# Patient Record
Sex: Female | Born: 1960
Health system: Southern US, Community
[De-identification: ages and names within clinical notes are randomized; demographics above are authoritative.]

## PROBLEM LIST (undated history)

## (undated) DIAGNOSIS — N809 Endometriosis, unspecified: Secondary | ICD-10-CM

## (undated) DIAGNOSIS — E079 Disorder of thyroid, unspecified: Secondary | ICD-10-CM

## (undated) DIAGNOSIS — I10 Essential (primary) hypertension: Secondary | ICD-10-CM

## (undated) DIAGNOSIS — K921 Melena: Secondary | ICD-10-CM

## (undated) DIAGNOSIS — J45909 Unspecified asthma, uncomplicated: Secondary | ICD-10-CM

## (undated) DIAGNOSIS — E119 Type 2 diabetes mellitus without complications: Secondary | ICD-10-CM

## (undated) HISTORY — DX: Unspecified asthma, uncomplicated: J45.909

## (undated) HISTORY — DX: Disorder of thyroid, unspecified: E07.9

## (undated) HISTORY — PX: CORNEAL TRANSPLANT: SHX108

## (undated) HISTORY — DX: Endometriosis, unspecified: N80.9

## (undated) HISTORY — DX: Melena: K92.1

## (undated) HISTORY — DX: Essential (primary) hypertension: I10

## (undated) HISTORY — DX: Type 2 diabetes mellitus without complications: E11.9

---

## 1995-06-13 DIAGNOSIS — N809 Endometriosis, unspecified: Secondary | ICD-10-CM

## 1995-06-13 HISTORY — DX: Endometriosis, unspecified: N80.9

## 1996-06-12 HISTORY — PX: TUBAL LIGATION: SHX77

## 1998-04-01 ENCOUNTER — Encounter: Payer: Self-pay | Admitting: Obstetrics and Gynecology

## 1998-04-01 ENCOUNTER — Ambulatory Visit (HOSPITAL_COMMUNITY): Admission: RE | Admit: 1998-04-01 | Discharge: 1998-04-01 | Payer: Self-pay | Admitting: Obstetrics and Gynecology

## 1999-05-19 ENCOUNTER — Encounter: Payer: Self-pay | Admitting: Obstetrics and Gynecology

## 1999-05-19 ENCOUNTER — Ambulatory Visit (HOSPITAL_COMMUNITY): Admission: RE | Admit: 1999-05-19 | Discharge: 1999-05-19 | Payer: Self-pay | Admitting: Obstetrics and Gynecology

## 2000-07-06 ENCOUNTER — Encounter: Payer: Self-pay | Admitting: Obstetrics and Gynecology

## 2000-07-06 ENCOUNTER — Ambulatory Visit (HOSPITAL_COMMUNITY): Admission: RE | Admit: 2000-07-06 | Discharge: 2000-07-06 | Payer: Self-pay | Admitting: Obstetrics and Gynecology

## 2000-07-10 ENCOUNTER — Emergency Department (HOSPITAL_COMMUNITY): Admission: EM | Admit: 2000-07-10 | Discharge: 2000-07-10 | Payer: Self-pay | Admitting: Emergency Medicine

## 2000-07-18 ENCOUNTER — Other Ambulatory Visit: Admission: RE | Admit: 2000-07-18 | Discharge: 2000-07-18 | Payer: Self-pay | Admitting: Obstetrics and Gynecology

## 2001-08-12 ENCOUNTER — Other Ambulatory Visit: Admission: RE | Admit: 2001-08-12 | Discharge: 2001-08-12 | Payer: Self-pay | Admitting: Obstetrics and Gynecology

## 2001-08-22 ENCOUNTER — Ambulatory Visit (HOSPITAL_COMMUNITY): Admission: RE | Admit: 2001-08-22 | Discharge: 2001-08-22 | Payer: Self-pay | Admitting: Pulmonary Disease

## 2001-08-22 ENCOUNTER — Encounter: Payer: Self-pay | Admitting: Pulmonary Disease

## 2001-09-30 ENCOUNTER — Encounter: Payer: Self-pay | Admitting: Obstetrics and Gynecology

## 2001-09-30 ENCOUNTER — Ambulatory Visit (HOSPITAL_COMMUNITY): Admission: RE | Admit: 2001-09-30 | Discharge: 2001-09-30 | Payer: Self-pay | Admitting: Obstetrics and Gynecology

## 2002-02-18 ENCOUNTER — Encounter: Admission: RE | Admit: 2002-02-18 | Discharge: 2002-02-18 | Payer: Self-pay | Admitting: Psychiatry

## 2002-02-18 ENCOUNTER — Inpatient Hospital Stay (HOSPITAL_COMMUNITY): Admission: EM | Admit: 2002-02-18 | Discharge: 2002-02-20 | Payer: Self-pay | Admitting: Psychiatry

## 2002-10-24 ENCOUNTER — Encounter: Payer: Self-pay | Admitting: Obstetrics and Gynecology

## 2002-10-24 ENCOUNTER — Encounter: Admission: RE | Admit: 2002-10-24 | Discharge: 2002-10-24 | Payer: Self-pay | Admitting: Obstetrics and Gynecology

## 2002-10-29 ENCOUNTER — Encounter: Payer: Self-pay | Admitting: Obstetrics and Gynecology

## 2002-10-29 ENCOUNTER — Encounter: Admission: RE | Admit: 2002-10-29 | Discharge: 2002-10-29 | Payer: Self-pay | Admitting: Obstetrics and Gynecology

## 2002-11-27 ENCOUNTER — Encounter: Payer: Self-pay | Admitting: Obstetrics and Gynecology

## 2002-11-27 ENCOUNTER — Encounter: Admission: RE | Admit: 2002-11-27 | Discharge: 2002-11-27 | Payer: Self-pay | Admitting: Obstetrics and Gynecology

## 2013-03-07 DIAGNOSIS — N924 Excessive bleeding in the premenopausal period: Secondary | ICD-10-CM | POA: Insufficient documentation

## 2014-08-13 DIAGNOSIS — Z8041 Family history of malignant neoplasm of ovary: Secondary | ICD-10-CM | POA: Insufficient documentation

## 2015-09-15 DIAGNOSIS — R7309 Other abnormal glucose: Secondary | ICD-10-CM | POA: Diagnosis not present

## 2015-09-15 DIAGNOSIS — I10 Essential (primary) hypertension: Secondary | ICD-10-CM | POA: Diagnosis not present

## 2015-09-15 DIAGNOSIS — R319 Hematuria, unspecified: Secondary | ICD-10-CM | POA: Diagnosis not present

## 2015-10-04 DIAGNOSIS — Z8041 Family history of malignant neoplasm of ovary: Secondary | ICD-10-CM | POA: Diagnosis not present

## 2015-10-04 DIAGNOSIS — Z1212 Encounter for screening for malignant neoplasm of rectum: Secondary | ICD-10-CM | POA: Diagnosis not present

## 2015-10-04 DIAGNOSIS — Z01419 Encounter for gynecological examination (general) (routine) without abnormal findings: Secondary | ICD-10-CM | POA: Diagnosis not present

## 2015-10-04 DIAGNOSIS — Z1231 Encounter for screening mammogram for malignant neoplasm of breast: Secondary | ICD-10-CM | POA: Diagnosis not present

## 2015-10-05 DIAGNOSIS — Z8041 Family history of malignant neoplasm of ovary: Secondary | ICD-10-CM | POA: Diagnosis not present

## 2015-10-11 DIAGNOSIS — Z79899 Other long term (current) drug therapy: Secondary | ICD-10-CM | POA: Diagnosis not present

## 2015-11-27 DIAGNOSIS — Z1231 Encounter for screening mammogram for malignant neoplasm of breast: Secondary | ICD-10-CM | POA: Diagnosis not present

## 2016-01-21 DIAGNOSIS — L0100 Impetigo, unspecified: Secondary | ICD-10-CM | POA: Diagnosis not present

## 2016-02-21 DIAGNOSIS — K641 Second degree hemorrhoids: Secondary | ICD-10-CM | POA: Diagnosis not present

## 2016-03-13 ENCOUNTER — Ambulatory Visit (INDEPENDENT_AMBULATORY_CARE_PROVIDER_SITE_OTHER): Payer: BLUE CROSS/BLUE SHIELD

## 2016-03-13 DIAGNOSIS — Z23 Encounter for immunization: Secondary | ICD-10-CM | POA: Diagnosis not present

## 2016-03-17 DIAGNOSIS — K602 Anal fissure, unspecified: Secondary | ICD-10-CM | POA: Diagnosis not present

## 2016-03-31 ENCOUNTER — Encounter: Payer: Self-pay | Admitting: Internal Medicine

## 2016-03-31 ENCOUNTER — Other Ambulatory Visit: Payer: Self-pay | Admitting: Internal Medicine

## 2016-03-31 ENCOUNTER — Ambulatory Visit (INDEPENDENT_AMBULATORY_CARE_PROVIDER_SITE_OTHER): Payer: BLUE CROSS/BLUE SHIELD | Admitting: Internal Medicine

## 2016-03-31 VITALS — BP 130/78 | HR 90 | Temp 99.0°F | Ht 66.0 in | Wt 178.5 lb

## 2016-03-31 DIAGNOSIS — I10 Essential (primary) hypertension: Secondary | ICD-10-CM

## 2016-03-31 DIAGNOSIS — J4520 Mild intermittent asthma, uncomplicated: Secondary | ICD-10-CM

## 2016-03-31 DIAGNOSIS — F411 Generalized anxiety disorder: Secondary | ICD-10-CM

## 2016-03-31 DIAGNOSIS — K602 Anal fissure, unspecified: Secondary | ICD-10-CM | POA: Diagnosis not present

## 2016-03-31 DIAGNOSIS — N809 Endometriosis, unspecified: Secondary | ICD-10-CM | POA: Insufficient documentation

## 2016-03-31 DIAGNOSIS — J45909 Unspecified asthma, uncomplicated: Secondary | ICD-10-CM | POA: Insufficient documentation

## 2016-03-31 DIAGNOSIS — R7303 Prediabetes: Secondary | ICD-10-CM | POA: Insufficient documentation

## 2016-03-31 DIAGNOSIS — E039 Hypothyroidism, unspecified: Secondary | ICD-10-CM | POA: Insufficient documentation

## 2016-03-31 LAB — COMPREHENSIVE METABOLIC PANEL
ALT: 23 U/L (ref 6–29)
AST: 17 U/L (ref 10–35)
Albumin: 4.3 g/dL (ref 3.6–5.1)
Alkaline Phosphatase: 66 U/L (ref 33–130)
BUN: 14 mg/dL (ref 7–25)
CO2: 28 mmol/L (ref 20–31)
Calcium: 9.2 mg/dL (ref 8.6–10.4)
Chloride: 102 mmol/L (ref 98–110)
Creat: 0.87 mg/dL (ref 0.50–1.05)
Glucose, Bld: 171 mg/dL — ABNORMAL HIGH (ref 65–99)
Potassium: 4.2 mmol/L (ref 3.5–5.3)
Sodium: 139 mmol/L (ref 135–146)
Total Bilirubin: 0.3 mg/dL (ref 0.2–1.2)
Total Protein: 6.7 g/dL (ref 6.1–8.1)

## 2016-03-31 LAB — LIPID PANEL
Cholesterol: 239 mg/dL — ABNORMAL HIGH (ref 125–200)
HDL: 41 mg/dL — ABNORMAL LOW (ref >=46)
LDL Cholesterol: 159 mg/dL — ABNORMAL HIGH (ref <130)
Total CHOL/HDL Ratio: 5.8 Ratio — ABNORMAL HIGH (ref <=5.0)
Triglycerides: 197 mg/dL — ABNORMAL HIGH (ref <150)
VLDL: 39 mg/dL — ABNORMAL HIGH (ref <30)

## 2016-03-31 LAB — T4, FREE: Free T4: 1.4 ng/dL (ref 0.8–1.8)

## 2016-03-31 LAB — TSH: TSH: 4.7 mIU/L — ABNORMAL HIGH

## 2016-03-31 MED ORDER — LISINOPRIL-HYDROCHLOROTHIAZIDE 10-12.5 MG PO TABS: 1.0000 | ORAL_TABLET | Freq: Every day | ORAL | 3 refills | Status: DC

## 2016-03-31 NOTE — Assessment & Plan Note (Signed)
Continue Hydroxyzine prn Will monitor 

## 2016-03-31 NOTE — Assessment & Plan Note (Signed)
Controlled on Lisinopril HCT CMET today Medication refilled x 1 year

## 2016-03-31 NOTE — Assessment & Plan Note (Signed)
A1C today Encouraged her to consume a low carb diet and exercise to lose weight

## 2016-03-31 NOTE — Patient Instructions (Signed)

## 2016-03-31 NOTE — Progress Notes (Signed)
HPI  Pt presents to the clinic today to establish care and for management of the conditions listed below. She is transferring care from [facility] in [place] Fissures: Using a Diltiazem compound twice daily, using a fiber supplement and stool softeners. She has seen a Radiographer, therapeutic in [place] and Surveyor, quantity in [place].  Childhood Asthma: Has not affected her an adult. She does not use any inhalers.  Endometriosis: s/p laparotomy for removal of part of the ovaries. No residual effects.  HTN: She is taking Lisinopril-HCTZ as prescribed. Her BP today is 130/78.  Hypothyroidism: She last had her levels drawn 08/2015. She is taking Synthroid as prescribed.  Anxiety: Due to work related stress. She has Hydroxyzine and reports she rarely takes this now.  Prediabetes: She reports her last A1C was 5.7%. She has gained some weight and has not been as strict with her diet and exercise lately.  Flu: 03/2016 Tetanus: 2013 Pap Smear: 08/2015 at [place]: 10/2015 Colon Screening: 04/2013, 10 years Vision Screening: annually Dentist: annually  Past Medical History:  Diagnosis Date  . Blood in stool   . Childhood asthma   . Endometriosis 1997  . Hypertension   . Thyroid disease     Current Outpatient Prescriptions  Medication Sig Dispense Refill  . hydrOXYzine (ATARAX/VISTARIL) 25 MG tablet TAKE 1 TABLET BY MOUTH THREE TIMES A DAY AS NEEDED    . levothyroxine (SYNTHROID, LEVOTHROID) 150 MCG tablet Take by mouth.    [name] lisinopril-hydrochlorothiazide (PRINZIDE,ZESTORETIC) 10-12.5 MG tablet Take by mouth.    . NONFORMULARY OR COMPOUNDED ITEM Apply 1 application topically 3 (three) times daily. Diltiazem 2% ointment    . Sennosides-Docusate Sodium (STOOL SOFTENER LAXATIVE PO) Take 1 tablet by mouth daily as needed.     No current facility-administered medications for this visit.     Allergies  Allergen Reactions  . Aspirin Shortness Of Breath and  Hives  . Penicillins Hives    Family History  Problem Relation Age of Onset  . Ovarian cancer Mother   . Hyperlipidemia Mother   . Alcohol abuse Father   . Hyperlipidemia Father   . Heart disease Father   . Stroke Father   . Hypertension Father     Social History   Social History  . Marital status: Unknown    Spouse name: N/A  . Number of children: N/A  . Years of education: N/A   Occupational History  . Not on file.   Social History Main Topics  . Smoking status: Never Smoker  . Smokeless tobacco: Never Used  . Alcohol use Yes     Comment: occasional  . Drug use: Unknown  . Sexual activity: Not on file   Other Topics Concern  . Not on file   Social History Narrative  . No narrative on file    ROS:  Constitutional: Denies fever, malaise, fatigue, headache or abrupt weight changes.  HEENT: Denies eye pain, eye redness, ear pain, ringing in the ears, wax buildup, runny nose, nasal congestion, bloody nose, or sore throat. Respiratory: Denies difficulty breathing, shortness of breath, cough or sputum production.   Cardiovascular: Denies chest pain, chest tightness, palpitations or swelling in the hands or feet.  Gastrointestinal: Pt reports intermittent blood in stool. Denies abdominal pain, bloating, constipation, diarrhea.  GU: Denies frequency, urgency, pain with urination, blood in urine, odor or discharge. Musculoskeletal: Denies decrease in range of motion, difficulty with gait, muscle pain or joint pain and swelling.  Skin: Denies redness, rashes, lesions or ulcercations.  Neurological: Denies dizziness, difficulty with memory, difficulty with speech or problems with balance and coordination.  Psych: Denies anxiety, depression, SI/HI.  No other specific complaints in a complete review of systems (except as listed in HPI above).  PE:  BP 130/78   Pulse 90   Temp 99 F (37.2 C) (Oral)   Ht 5\' 6"  (1.676 m)   Wt 178 lb 8 oz (81 kg)   LMP 09/11/2015  (Approximate)   SpO2 98%   BMI 28.81 kg/m  Wt Readings from Last 3 Encounters:  03/31/16 178 lb 8 oz (81 kg)    General: Appears her stated age, well developed, well nourished in NAD. Skin: Dry and intact. Neck: Neck supple, trachea midline. No masses, lumps or thyromegaly present.  Cardiovascular: Normal rate and rhythm. S1,S2 noted.  No murmur, rubs or gallops noted. No JVD or BLE edema.  Pulmonary/Chest: Normal effort and positive vesicular breath sounds. No respiratory distress. No wheezes, rales or ronchi noted.  Abdomen: Soft and nontender. Active bowel sounds. Neurological: Alert and oriented.  Psychiatric: Mood and affect normal. Behavior is normal. Judgment and thought content normal.    Assessment and Plan:  RTC in 1 year, for your annual exam Webb Silversmith, NP

## 2016-03-31 NOTE — Assessment & Plan Note (Signed)
Currently not an issue Will monitor 

## 2016-03-31 NOTE — Assessment & Plan Note (Signed)
Resolved s/p lapartomy

## 2016-03-31 NOTE — Addendum Note (Signed)
Addended by: Ellamae Sia on: 03/31/2016 04:05 PM   Modules accepted: Orders

## 2016-03-31 NOTE — Assessment & Plan Note (Signed)
Continue Diltiazem compound as prescribed She will continue to follow with colorectal specialist

## 2016-03-31 NOTE — Assessment & Plan Note (Signed)
TSH and T4 today Will refill/adjust Synthroid based on labs

## 2016-04-06 ENCOUNTER — Other Ambulatory Visit: Payer: Self-pay

## 2016-04-06 LAB — HEMOGLOBIN A1C
Hgb A1c MFr Bld: 5.8 % — ABNORMAL HIGH (ref <5.7)
Mean Plasma Glucose: 120 mg/dL

## 2016-04-06 MED ORDER — LEVOTHYROXINE SODIUM 150 MCG PO TABS: 150.0000 ug | ORAL_TABLET | Freq: Every day | ORAL | 3 refills | Status: DC

## 2016-04-06 MED ORDER — SIMVASTATIN 10 MG PO TABS: 10.0000 mg | ORAL_TABLET | Freq: Every day | ORAL | 3 refills | Status: DC

## 2016-04-06 MED ORDER — SIMVASTATIN 10 MG PO TABS: 10.0000 mg | ORAL_TABLET | Freq: Every day | ORAL | 0 refills | Status: DC

## 2016-04-06 NOTE — Addendum Note (Signed)
Addended by: Lurlean Nanny on: 04/06/2016 12:01 PM   Modules accepted: Orders

## 2016-04-06 NOTE — Telephone Encounter (Signed)
While I was on phone with pt Mary Baity NP sent in refills for levothyroxine and simvastatin.nothing further needed.

## 2016-04-14 ENCOUNTER — Encounter: Payer: Self-pay | Admitting: Family Medicine

## 2016-04-14 ENCOUNTER — Ambulatory Visit (INDEPENDENT_AMBULATORY_CARE_PROVIDER_SITE_OTHER): Payer: BLUE CROSS/BLUE SHIELD | Admitting: Family Medicine

## 2016-04-14 VITALS — BP 124/88 | HR 75 | Temp 98.2°F | Ht 66.0 in | Wt 181.0 lb

## 2016-04-14 DIAGNOSIS — R350 Frequency of micturition: Secondary | ICD-10-CM

## 2016-04-14 DIAGNOSIS — N390 Urinary tract infection, site not specified: Secondary | ICD-10-CM | POA: Insufficient documentation

## 2016-04-14 DIAGNOSIS — N3000 Acute cystitis without hematuria: Secondary | ICD-10-CM | POA: Diagnosis not present

## 2016-04-14 LAB — POC URINALSYSI DIPSTICK (AUTOMATED)
Bilirubin, UA: NEGATIVE
Blood, UA: NEGATIVE
Glucose, UA: NEGATIVE
Ketones, UA: NEGATIVE
Leukocytes, UA: NEGATIVE
Nitrite, UA: NEGATIVE
Protein, UA: NEGATIVE
Spec Grav, UA: 1.025
Urobilinogen, UA: 0.2
pH, UA: 6.5

## 2016-04-14 MED ORDER — SULFAMETHOXAZOLE-TRIMETHOPRIM 800-160 MG PO TABS: 1.0000 | ORAL_TABLET | Freq: Two times a day (BID) | ORAL | 0 refills | Status: DC

## 2016-04-14 NOTE — Progress Notes (Signed)
Pre visit review using our clinic review tool, if applicable. No additional management support is needed unless otherwise documented below in the visit note. 

## 2016-04-14 NOTE — Assessment & Plan Note (Signed)
Likely partially treated UTI. Recommend completion of antibiotics x 3 days course ( sulfa) given still with symptoms despite clear UA.  Push fluids.

## 2016-04-14 NOTE — Progress Notes (Signed)
   Subjective:    Patient ID: Mary Schaefer, female    DOB: [birth year], 55 y.o.   MRN: [id number]  Urinary Frequency   This is a new problem. The current episode started in the past 7 days (3 days). The problem has been gradually improving. The quality of the pain is described as burning. The pain is moderate. There has been no fever. She is sexually active. There is no history of pyelonephritis. Associated symptoms include flank pain, frequency and urgency. Pertinent negatives include no chills, hematuria, hesitancy, nausea, sweats or vomiting. Associated symptoms comments: Left mid back pain. She has tried antibiotics and NSAIDs for the symptoms. The treatment provided mild relief. There is no history of catheterization, kidney stones, recurrent UTIs, a single kidney or a urological procedure. frequent UTI, last in 11/2015  Dysuria   Associated symptoms include flank pain, frequency and urgency. Pertinent negatives include no chills, hematuria, hesitancy, nausea, sweats or vomiting. There is no history of catheterization, kidney stones, recurrent UTIs, a single kidney or a urological procedure. frequent UTI, last in 11/2015  Back Pain  Associated symptoms include dysuria.    She has also noted a mole on  Right mid back. Husband noted it during [place]. Slightly itching, no drainage.  Unsure if changing. No history of skin issues, no family history of melanoma.  Review of Systems  Constitutional: Negative for chills.  Gastrointestinal: Negative for nausea and vomiting.  Genitourinary: Positive for dysuria, flank pain, frequency and urgency. Negative for hematuria and hesitancy.  Musculoskeletal: Positive for back pain.       Objective:   Physical Exam  Constitutional: Vital signs are normal. She appears well-developed and well-nourished. She is cooperative.  Non-toxic appearance. She does not appear ill. No distress.  HENT:  Head: Normocephalic.  Right Ear: Hearing, tympanic membrane,  external ear and ear canal normal. Tympanic membrane is not erythematous, not retracted and not bulging.  Left Ear: Hearing, tympanic membrane, external ear and ear canal normal. Tympanic membrane is not erythematous, not retracted and not bulging.  Nose: No mucosal edema or rhinorrhea. Right sinus exhibits no maxillary sinus tenderness and no frontal sinus tenderness. Left sinus exhibits no maxillary sinus tenderness and no frontal sinus tenderness.  Mouth/Throat: Uvula is midline, oropharynx is clear and moist and mucous membranes are normal.  Eyes: Conjunctivae, EOM and lids are normal. Pupils are equal, round, and reactive to light. Lids are everted and swept, no foreign bodies found.  Neck: Trachea normal and normal range of motion. Neck supple. Carotid bruit is not present. No thyroid mass and no thyromegaly present.  Cardiovascular: Normal rate, regular rhythm, S1 normal, S2 normal, normal heart sounds, intact distal pulses and normal pulses.  Exam reveals no gallop and no friction rub.   No murmur heard. Pulmonary/Chest: Effort normal and breath sounds normal. No tachypnea. No respiratory distress. She has no decreased breath sounds. She has no wheezes. She has no rhonchi. She has no rales.  Abdominal: Soft. Normal appearance and bowel sounds are normal. There is no tenderness. There is CVA tenderness.  Neurological: She is alert.  Skin: Skin is warm, dry and intact. No rash noted.  Psychiatric: Her speech is normal and behavior is normal. Judgment and thought content normal. Her mood appears not anxious. Cognition and memory are normal. She does not exhibit a depressed mood.          Assessment & Plan:

## 2016-04-14 NOTE — Patient Instructions (Addendum)
Push fluids. Complete antibiotics x 3 days. Call if fever on antibiotics or not improving as expected.   Urinary Tract Infection Urinary tract infections (UTIs) can develop anywhere along your urinary tract. Your urinary tract is your body's drainage system for removing wastes and extra water. Your urinary tract includes two kidneys, two ureters, a bladder, and a urethra. Your kidneys are a pair of bean-shaped organs. Each kidney is about the size of your fist. They are located below your ribs, one on each side of your spine. CAUSES Infections are caused by microbes, which are microscopic organisms, including fungi, viruses, and bacteria. These organisms are so small that they can only be seen through a microscope. Bacteria are the microbes that most commonly cause UTIs. SYMPTOMS  Symptoms of UTIs may vary by age and gender of the patient and by the location of the infection. Symptoms in young women typically include a frequent and intense urge to urinate and a painful, burning feeling in the bladder or urethra during urination. Older women and men are more likely to be tired, shaky, and weak and have muscle aches and abdominal pain. A fever may mean the infection is in your kidneys. Other symptoms of a kidney infection include pain in your back or sides below the ribs, nausea, and vomiting. DIAGNOSIS To diagnose a UTI, your caregiver will ask you about your symptoms. Your caregiver will also ask you to provide a urine sample. The urine sample will be tested for bacteria and white blood cells. White blood cells are made by your body to help fight infection. TREATMENT  Typically, UTIs can be treated with medication. Because most UTIs are caused by a bacterial infection, they usually can be treated with the use of antibiotics. The choice of antibiotic and length of treatment depend on your symptoms and the type of bacteria causing your infection. HOME CARE INSTRUCTIONS  If you were prescribed  antibiotics, take them exactly as your caregiver instructs you. Finish the medication even if you feel better after you have only taken some of the medication.  Drink enough water and fluids to keep your urine clear or pale yellow.  Avoid caffeine, tea, and carbonated beverages. They tend to irritate your bladder.  Empty your bladder often. Avoid holding urine for long periods of time.  Empty your bladder before and after sexual intercourse.  After a bowel movement, women should cleanse from front to back. Use each tissue only once. SEEK MEDICAL CARE IF:   You have back pain.  You develop a fever.  Your symptoms do not begin to resolve within 3 days. SEEK IMMEDIATE MEDICAL CARE IF:   You have severe back pain or lower abdominal pain.  You develop chills.  You have nausea or vomiting.  You have continued burning or discomfort with urination. MAKE SURE YOU:   Understand these instructions.  Will watch your condition.  Will get help right away if you are not doing well or get worse.   This information is not intended to replace advice given to you by your health care provider. Make sure you discuss any questions you have with your health care provider.   Document Released: 03/08/2005 Document Revised: 02/17/2015 Document Reviewed: 07/07/2011 Elsevier Interactive Patient Education [employer].

## 2016-05-10 DIAGNOSIS — K602 Anal fissure, unspecified: Secondary | ICD-10-CM | POA: Diagnosis not present

## 2016-05-17 ENCOUNTER — Ambulatory Visit (INDEPENDENT_AMBULATORY_CARE_PROVIDER_SITE_OTHER): Payer: BLUE CROSS/BLUE SHIELD | Admitting: Family Medicine

## 2016-05-17 ENCOUNTER — Encounter: Payer: Self-pay | Admitting: *Deleted

## 2016-05-17 ENCOUNTER — Encounter: Payer: Self-pay | Admitting: Family Medicine

## 2016-05-17 VITALS — BP 116/82 | HR 95 | Temp 98.8°F | Wt 182.2 lb

## 2016-05-17 DIAGNOSIS — J00 Acute nasopharyngitis [common cold]: Secondary | ICD-10-CM

## 2016-05-17 MED ORDER — HYDROCODONE-HOMATROPINE 5-1.5 MG/5ML PO SYRP: ORAL_SOLUTION | ORAL | 0 refills | Status: DC

## 2016-05-17 NOTE — Progress Notes (Signed)
Pre visit review using our clinic review tool, if applicable. No additional management support is needed unless otherwise documented below in the visit note. 

## 2016-05-17 NOTE — Progress Notes (Signed)
Dr. Frederico Hamman T. Kateri Balch, MD, [facility] [place], [address] Phone: [phone] Fax: [phone]  05/17/2016  Patient: Mary Schaefer, MRN: [id number], DOB: 03/24/1961, 55 y.o.  Primary Physician:  Webb Silversmith, NP   Chief Complaint  Patient presents with  . URI    ST,ear,congestion,cough x 3-4 days   Subjective:   This 55 y.o. female patient presents with runny nose, sneezing, cough, sore throat, malaise and minimal / low-grade fever .   Ear, throat, and chest hurts  - 4 days, not getting any lung probs.  Nonsmoker.   ? Fever last night.  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   + recent exposure to others with similar symptoms.   The patent denies sore throat as the primary complaint. Denies sthortness of breath/wheezing, high fever, chest pain, rhinits for more than 14 days, significant myalgia, otalgia, facial pain, abdominal pain, changes in bowel or bladder.  PMH, PHS, Allergies, Problem List, Medications, Family History, and Social History have all been reviewed.  Patient Active Problem List   Diagnosis Date Noted  . UTI (urinary tract infection) 04/14/2016  . Prediabetes 03/31/2016  . Essential hypertension 03/31/2016  . Acquired hypothyroidism 03/31/2016  . Anal fissure 03/31/2016  . Childhood asthma 03/31/2016  . Endometriosis 03/31/2016  . Generalized anxiety disorder 03/31/2016    Past Medical History:  Diagnosis Date  . Blood in stool   . Childhood asthma   . Endometriosis 1997  . Hypertension   . Thyroid disease     Past Surgical History:  Procedure Laterality Date  . TUBAL LIGATION  1998    Social History   Social History  . Marital status: Unknown    Spouse name: N/A  . Number of children: N/A  . Years of education: N/A   Occupational History  . Not on file.   Social History Main Topics  . Smoking status: Never Smoker  . Smokeless tobacco: Never Used  . Alcohol use Yes   Comment: occasional  . Drug use:   . Sexual activity: Yes   Other Topics Concern  . Not on file   Social History Narrative  . No narrative on file    Family History  Problem Relation Age of Onset  . Ovarian cancer Mother   . Hyperlipidemia Mother   . Alcohol abuse Father   . Hyperlipidemia Father   . Heart disease Father   . Stroke Father   . Hypertension Father     Allergies  Allergen Reactions  . Aspirin Shortness Of Breath and Hives  . Penicillins Hives    Medication list reviewed and updated in full in [place].  ROS as above, eating and drinking - tolerating PO. Urinating normally. No excessive vomitting or diarrhea. O/w as above.  Objective:   Blood pressure 116/82, pulse 95, temperature 98.8 F (37.1 C), temperature source Oral, weight 182 lb 4 oz (82.7 kg), last menstrual period 07/19/2015, SpO2 98 %.  GEN: WDWN, Non-toxic, Atraumatic, normocephalic. A and O x 3. HEENT: Oropharynx clear without exudate, MMM, no significant LAD, mild rhinnorhea Ears: TM clear, COL visualized with good landmarks CV: RRR, no m/g/r. Pulm: CTA B, no wheezes, rhonchi, or crackles, normal respiratory effort. EXT: no c/c/e Psych: well oriented, neither depressed nor anxious in appearance  Objective Data:  Assessment and Plan:   Acute nasopharyngitis  Supportive care reviewed with patient. See patient instruction section.  Follow-up: No Follow-up on file.  New  Prescriptions   HYDROCODONE-HOMATROPINE (HYCODAN) 5-1.5 MG/5ML SYRUP    1 tsp po at night before bed prn cough   Signed,  Jaizon Deroos T. Jerita Wimbush, MD   Patient's Medications  New Prescriptions   HYDROCODONE-HOMATROPINE (HYCODAN) 5-1.5 MG/5ML SYRUP    1 tsp po at night before bed prn cough  Previous Medications   HYDROXYZINE (ATARAX/VISTARIL) 25 MG TABLET    TAKE 1 TABLET BY MOUTH THREE TIMES A DAY AS NEEDED   LEVOTHYROXINE (SYNTHROID, LEVOTHROID) 150 MCG TABLET    Take 1 tablet (150 mcg total) by mouth daily  before breakfast.   LISINOPRIL-HYDROCHLOROTHIAZIDE (PRINZIDE,ZESTORETIC) 10-12.5 MG TABLET    Take 1 tablet by mouth daily.   NONFORMULARY OR COMPOUNDED ITEM    Apply 1 application topically 3 (three) times daily. Diltiazem 2% ointment   SENNOSIDES-DOCUSATE SODIUM (STOOL SOFTENER LAXATIVE PO)    Take 1 tablet by mouth daily as needed.   SIMVASTATIN (ZOCOR) 10 MG TABLET    Take 1 tablet (10 mg total) by mouth at bedtime.  Modified Medications   No medications on file  Discontinued Medications   SULFAMETHOXAZOLE-TRIMETHOPRIM (BACTRIM DS,SEPTRA DS) 800-160 MG TABLET    Take 1 tablet by mouth 2 (two) times daily.

## 2016-05-19 ENCOUNTER — Encounter: Payer: Self-pay | Admitting: Family Medicine

## 2016-05-19 ENCOUNTER — Ambulatory Visit (INDEPENDENT_AMBULATORY_CARE_PROVIDER_SITE_OTHER): Payer: BLUE CROSS/BLUE SHIELD | Admitting: Family Medicine

## 2016-05-19 VITALS — BP 130/60 | HR 104 | Temp 98.4°F | Wt 182.0 lb

## 2016-05-19 DIAGNOSIS — J069 Acute upper respiratory infection, unspecified: Secondary | ICD-10-CM

## 2016-05-19 DIAGNOSIS — B9789 Other viral agents as the cause of diseases classified elsewhere: Secondary | ICD-10-CM | POA: Diagnosis not present

## 2016-05-19 DIAGNOSIS — R112 Nausea with vomiting, unspecified: Secondary | ICD-10-CM | POA: Diagnosis not present

## 2016-05-19 MED ORDER — ONDANSETRON 8 MG PO TBDP
8.0000 mg | ORAL_TABLET | Freq: Once | ORAL | Status: AC
  Administered 2016-05-19: 8 mg via ORAL

## 2016-05-19 MED ORDER — ONDANSETRON 4 MG PO TBDP: 4.0000 mg | ORAL_TABLET | Freq: Once | ORAL | Status: DC

## 2016-05-19 MED ORDER — ONDANSETRON 8 MG PO TBDP: 8.0000 mg | ORAL_TABLET | Freq: Three times a day (TID) | ORAL | 0 refills | Status: DC | PRN

## 2016-05-19 NOTE — Patient Instructions (Signed)
Bland Diet Introduction A bland diet consists of foods that do not have a lot of fat or fiber. Foods without fat or fiber are easier for the body to digest. They are also less likely to irritate your mouth, throat, stomach, and other parts of your gastrointestinal tract. A bland diet is sometimes called a BRAT diet. What is my plan? Your health care provider or dietitian may recommend specific changes to your diet to prevent and treat your symptoms, such as:  Eating small meals often.  Cooking food until it is soft enough to chew easily.  Chewing your food well.  Drinking fluids slowly.  Not eating foods that are very spicy, sour, or fatty.  Not eating citrus fruits, such as oranges and grapefruit. What do I need to know about this diet?  Eat a variety of foods from the bland diet food list.  Do not follow a bland diet longer than you have to.  Ask your health care provider whether you should take vitamins. What foods can I eat? Grains  Hot cereals, such as cream of wheat. Bread, crackers, or tortillas made from refined white flour. Rice. Vegetables  Canned or cooked vegetables. Mashed or boiled potatoes. Fruits  Bananas. Applesauce. Other types of cooked or canned fruit with the skin and seeds removed, such as canned peaches or pears. Meats and Other Protein Sources  Scrambled eggs. Creamy peanut butter or other nut butters. Lean, well-cooked meats, such as chicken or fish. Tofu. Soups or broths. Dairy  Low-fat dairy products, such as milk, cottage cheese, or yogurt. Beverages  Water. Herbal tea. Apple juice. Sweets and Desserts  Pudding. Custard. Fruit gelatin. Ice cream. Fats and Oils  Mild salad dressings. Canola or olive oil. The items listed above may not be a complete list of allowed foods or beverages. Contact your dietitian for more options.  What foods are not recommended? Foods and ingredients that are often not recommended include:  Spicy foods, such as hot  sauce or salsa.  Fried foods.  Sour foods, such as pickled or fermented foods.  Raw vegetables or fruits, especially citrus or berries.  Caffeinated drinks.  Alcohol.  Strongly flavored seasonings or condiments. The items listed above may not be a complete list of foods and beverages that are not allowed. Contact your dietitian for more information.  This information is not intended to replace advice given to you by your health care provider. Make sure you discuss any questions you have with your health care provider. Document Released: 09/20/2015 Document Revised: 11/04/2015 Document Reviewed: 06/10/2014  2017 Elsevier  

## 2016-05-19 NOTE — Progress Notes (Signed)
   Subjective:    Patient ID: Mary Schaefer, female    DOB: [birth year], 55 y.o.   MRN: [id number]  HPI This is a 55 yo female who presents today with emesis x 1. Nauseated without cough. No food today, only water, no abdominal pain, nausea only. Has been taking Dayquil, Mucinex DM and Hycodan with some relief. Yellow nasal drainage and ear pain. Afraid to take ibuprofen- worried about stomach upset. Fever to 100. Nothing for fever today. She was seen 2 days ago with URI symptoms (started 5 days ago). Cough productive of yellow- green sputum, gags her. No wheeze or SOB. Feels achy.   Past Medical History:  Diagnosis Date  . Blood in stool   . Childhood asthma   . Endometriosis 1997  . Hypertension   . Thyroid disease    Past Surgical History:  Procedure Laterality Date  . TUBAL LIGATION  1998   Family History  Problem Relation Age of Onset  . Ovarian cancer Mother   . Hyperlipidemia Mother   . Alcohol abuse Father   . Hyperlipidemia Father   . Heart disease Father   . Stroke Father   . Hypertension Father    Social History  Substance Use Topics  . Smoking status: Never Smoker  . Smokeless tobacco: Never Used  . Alcohol use Yes     Comment: occasional      Review of Systems Per HPI     Objective:   Physical Exam  Constitutional: She is oriented to person, place, and time. She appears well-developed and well-nourished. She appears ill. No distress.  HENT:  Head: Normocephalic and atraumatic.  Right Ear: Tympanic membrane, external ear and ear canal normal.  Left Ear: Tympanic membrane, external ear and ear canal normal.  Nose: Mucosal edema and rhinorrhea present.  Mouth/Throat: Uvula is midline and mucous membranes are normal. Posterior oropharyngeal erythema present. No oropharyngeal exudate or posterior oropharyngeal edema.  Cardiovascular: Normal rate, regular rhythm and normal heart sounds.   Pulmonary/Chest: Effort normal and breath sounds normal.  Abdominal:  Soft. Bowel sounds are normal. She exhibits no distension. There is no tenderness. There is no rebound and no guarding.  Neurological: She is alert and oriented to person, place, and time.  Skin: Skin is warm and dry. She is not diaphoretic.  Psychiatric: She has a normal mood and affect. Her behavior is normal. Thought content normal.  Vitals reviewed.     BP 130/60   Pulse (!) 104   Temp 98.4 F (36.9 C) (Oral)   Wt 182 lb (82.6 kg)   LMP 07/19/2015   BMI 29.38 kg/m  Wt Readings from Last 3 Encounters:  05/19/16 182 lb (82.6 kg)  05/17/16 182 lb 4 oz (82.7 kg)  04/14/16 181 lb (82.1 kg)       Assessment & Plan:  1. Non-intractable vomiting with nausea, unspecified vomiting type - suspect viral etiology - BRAT diet, increase fluids until urine light yellow - ondansetron (ZOFRAN-ODT) disintegrating tablet 8 mg; Take 1 tablet (8 mg total) by mouth once. - ondansetron (ZOFRAN-ODT) 8 MG disintegrating tablet; Take 1 tablet (8 mg total) by mouth every 8 (eight) hours as needed for nausea.  Dispense: 15 tablet; Refill: 0  2. Viral URI with cough - continue Hycodan, Mucinex DM, Dayquil PRN  - RTC precautions reviewed  Clarene Reamer, FNP-BC  [facility]  05/19/2016 2:29 PM

## 2016-05-19 NOTE — Progress Notes (Signed)
Pre visit review using our clinic review tool, if applicable. No additional management support is needed unless otherwise documented below in the visit note. 

## 2016-07-07 ENCOUNTER — Other Ambulatory Visit: Payer: BLUE CROSS/BLUE SHIELD

## 2016-08-03 ENCOUNTER — Ambulatory Visit (INDEPENDENT_AMBULATORY_CARE_PROVIDER_SITE_OTHER): Payer: BLUE CROSS/BLUE SHIELD | Admitting: Internal Medicine

## 2016-08-03 ENCOUNTER — Encounter: Payer: Self-pay | Admitting: Internal Medicine

## 2016-08-03 VITALS — BP 126/84 | HR 94 | Temp 98.4°F | Wt 190.8 lb

## 2016-08-03 DIAGNOSIS — L509 Urticaria, unspecified: Secondary | ICD-10-CM

## 2016-08-03 DIAGNOSIS — H10413 Chronic giant papillary conjunctivitis, bilateral: Secondary | ICD-10-CM | POA: Diagnosis not present

## 2016-08-03 NOTE — Progress Notes (Signed)
Subjective:    Patient ID: Mary Schaefer, female    DOB: 05-26-1961, 56 y.o.   MRN: [id number]  HPI  Pt presents to the clinic today with c/o hives. She reports this started 6 weeks ago. It is intermittent. It breaks out on her chest and abdomen. It is very itchy. For some reason, it only occurs at night. Other than that she can not find a pattern. She has not washed her sheets in any new detergent. She does not have issues with hives during the day. She does not feel stressed out. She has not eaten anything new. She has taken Hydroxyzine with good relief.   Review of Systems      Past Medical History:  Diagnosis Date  . Blood in stool   . Childhood asthma   . Endometriosis 1997  . Hypertension   . Thyroid disease     Current Outpatient Prescriptions  Medication Sig Dispense Refill  . HYDROcodone-homatropine (HYCODAN) 5-1.5 MG/5ML syrup 1 tsp po at night before bed prn cough 120 mL 0  . hydrOXYzine (ATARAX/VISTARIL) 25 MG tablet TAKE 1 TABLET BY MOUTH THREE TIMES A DAY AS NEEDED    . levothyroxine (SYNTHROID, LEVOTHROID) 150 MCG tablet Take 1 tablet (150 mcg total) by mouth daily before breakfast. 90 tablet 3  . lisinopril-hydrochlorothiazide (PRINZIDE,ZESTORETIC) 10-12.5 MG tablet Take 1 tablet by mouth daily. 90 tablet 3  . NONFORMULARY OR COMPOUNDED ITEM Apply 1 application topically 3 (three) times daily. Diltiazem 2% ointment    . ondansetron (ZOFRAN-ODT) 8 MG disintegrating tablet Take 1 tablet (8 mg total) by mouth every 8 (eight) hours as needed for nausea. 15 tablet 0  . Sennosides-Docusate Sodium (STOOL SOFTENER LAXATIVE PO) Take 1 tablet by mouth daily as needed.    . simvastatin (ZOCOR) 10 MG tablet Take 1 tablet (10 mg total) by mouth at bedtime. 90 tablet 0   No current facility-administered medications for this visit.     Allergies  Allergen Reactions  . Aspirin Shortness Of Breath and Hives  . Penicillins Hives    Family History  Problem Relation Age of  Onset  . Ovarian cancer Mother   . Hyperlipidemia Mother   . Alcohol abuse Father   . Hyperlipidemia Father   . Heart disease Father   . Stroke Father   . Hypertension Father     Social History   Social History  . Marital status: Unknown    Spouse name: N/A  . Number of children: N/A  . Years of education: N/A   Occupational History  . Not on file.   Social History Main Topics  . Smoking status: Never Smoker  . Smokeless tobacco: Never Used  . Alcohol use Yes     Comment: occasional  . Drug use: Yes  . Sexual activity: Yes   Other Topics Concern  . Not on file   Social History Narrative  . No narrative on file     Constitutional: Denies fever, malaise, fatigue, headache or abrupt weight changes.  Skin: Pt reports hives. Denies ulcercations.    No other specific complaints in a complete review of systems (except as listed in HPI above).  Objective:   Physical Exam   BP 126/84   Pulse 94   Temp 98.4 F (36.9 C) (Oral)   Wt 190 lb 12 oz (86.5 kg)   SpO2 98%   BMI 30.79 kg/m  Wt Readings from Last 3 Encounters:  08/03/16 190 lb 12 oz (86.5 kg)  05/19/16 182 lb (82.6 kg)  05/17/16 182 lb 4 oz (82.7 kg)    General: Appears her stated age, well developed, well nourished in NAD. Skin: Warm, dry and intact. No hives today, but she brought pictures.   BMET    Component Value Date/Time   NA 139 03/31/2016 1606   K 4.2 03/31/2016 1606   CL 102 03/31/2016 1606   CO2 28 03/31/2016 1606   GLUCOSE 171 (H) 03/31/2016 1606   BUN 14 03/31/2016 1606   CREATININE 0.87 03/31/2016 1606   CALCIUM 9.2 03/31/2016 1606    Lipid Panel     Component Value Date/Time   CHOL 239 (H) 03/31/2016 1606   TRIG 197 (H) 03/31/2016 1606   HDL 41 (L) 03/31/2016 1606   CHOLHDL 5.8 (H) 03/31/2016 1606   VLDL 39 (H) 03/31/2016 1606   LDLCALC 159 (H) 03/31/2016 1606    CBC No results found for: WBC, RBC, HGB, HCT, PLT, MCV, MCH, MCHC, RDW, LYMPHSABS, MONOABS, EOSABS,  BASOSABS  Hgb A1C Lab Results  Component Value Date   HGBA1C 5.8 (H) 03/31/2016           Assessment & Plan:   Hives, idiopathic at this point:  Start taking a Claritin or Allegra once daily Can use the Hydroxyzine for breakthrough If symptoms persist, we can refer you to an allergist for further testing  RTC in 1 month for your annual exam Webb Silversmith, NP

## 2016-08-03 NOTE — Patient Instructions (Signed)
Hives Introduction Hives (urticaria) are itchy, red, swollen areas on your skin. Hives can show up on any part of your body, and they can vary in size. They can be as small as the tip of a pen or much larger. Hives often fade within 24 hours (acute hives). In other cases, new hives show up after old ones fade. This can continue for many days or weeks (chronic hives). Hives are caused by your body's reaction to an irritant or to something that you are allergic to (trigger). You can get hives right after being around a trigger or hours later. Hives do not spread from person to person (are not contagious). Hives may get worse if you scratch them, if you exercise, or if you have worries (emotional stress). Follow these instructions at home: Medicines  Take or apply over-the-counter and prescription medicines only as told by your doctor.  If you were prescribed an antibiotic medicine, use it as told by your doctor. Do not stop taking the antibiotic even if you start to feel better. Skin Care  Apply cool, wet cloths (cool compresses) to the itchy, red, swollen areas.  Do not scratch your skin. Do not rub your skin. General instructions  Do not take hot showers or baths. This can make itching worse.  Do not wear tight clothes.  Use sunscreen and wear clothing that covers your skin when you are outside.  Avoid any triggers that cause your hives. Keep a journal to help you keep track of what causes your hives. Write down:  What medicines you take.  What you eat and drink.  What products you use on your skin.  Keep all follow-up visits as told by your doctor. This is important. Contact a doctor if:  Your symptoms are not better with medicine.  Your joints are painful or swollen. Get help right away if:  You have a fever.  You have belly pain.  Your tongue or lips are swollen.  Your eyelids are swollen.  Your chest or throat feels tight.  You have trouble breathing or  swallowing. These symptoms may be an emergency. Do not wait to see if the symptoms will go away. Get medical help right away. Call your local emergency services (911 in the [place] Do not drive yourself to the hospital.  This information is not intended to replace advice given to you by your health care provider. Make sure you discuss any questions you have with your health care provider. Document Released: 03/07/2008 Document Revised: 11/04/2015 Document Reviewed: 03/17/2015  2017 [employer]

## 2016-08-24 ENCOUNTER — Other Ambulatory Visit (INDEPENDENT_AMBULATORY_CARE_PROVIDER_SITE_OTHER): Payer: BLUE CROSS/BLUE SHIELD

## 2016-08-24 DIAGNOSIS — I10 Essential (primary) hypertension: Secondary | ICD-10-CM

## 2016-08-24 DIAGNOSIS — E038 Other specified hypothyroidism: Secondary | ICD-10-CM | POA: Diagnosis not present

## 2016-08-24 DIAGNOSIS — Z13220 Encounter for screening for lipoid disorders: Secondary | ICD-10-CM | POA: Diagnosis not present

## 2016-08-24 DIAGNOSIS — Z1159 Encounter for screening for other viral diseases: Secondary | ICD-10-CM | POA: Diagnosis not present

## 2016-08-24 LAB — COMPREHENSIVE METABOLIC PANEL
ALT: 55 U/L — ABNORMAL HIGH (ref 0–35)
AST: 25 U/L (ref 0–37)
Albumin: 4.5 g/dL (ref 3.5–5.2)
Alkaline Phosphatase: 72 U/L (ref 39–117)
BUN: 16 mg/dL (ref 6–23)
CO2: 28 mEq/L (ref 19–32)
Calcium: 9.9 mg/dL (ref 8.4–10.5)
Chloride: 102 mEq/L (ref 96–112)
Creatinine, Ser: 0.8 mg/dL (ref 0.40–1.20)
GFR: 79.25 mL/min (ref >60.00)
Glucose, Bld: 154 mg/dL — ABNORMAL HIGH (ref 70–99)
Potassium: 4.1 mEq/L (ref 3.5–5.1)
Sodium: 140 mEq/L (ref 135–145)
Total Bilirubin: 0.5 mg/dL (ref 0.2–1.2)
Total Protein: 7.5 g/dL (ref 6.0–8.3)

## 2016-08-24 LAB — LIPID PANEL
Cholesterol: 218 mg/dL — ABNORMAL HIGH (ref 0–200)
HDL: 42.4 mg/dL (ref >39.00)
LDL Cholesterol: 141 mg/dL — ABNORMAL HIGH (ref 0–99)
NonHDL: 175.25
Total CHOL/HDL Ratio: 5
Triglycerides: 172 mg/dL — ABNORMAL HIGH (ref 0.0–149.0)
VLDL: 34.4 mg/dL (ref 0.0–40.0)

## 2016-08-24 LAB — TSH: TSH: 4.74 u[IU]/mL — ABNORMAL HIGH (ref 0.35–4.50)

## 2016-08-24 LAB — T4, FREE: Free T4: 0.98 ng/dL (ref 0.60–1.60)

## 2016-08-25 ENCOUNTER — Encounter: Payer: BLUE CROSS/BLUE SHIELD | Admitting: Internal Medicine

## 2016-08-25 LAB — HEPATITIS C ANTIBODY: HCV Ab: NEGATIVE

## 2016-08-29 ENCOUNTER — Telehealth: Payer: Self-pay | Admitting: Internal Medicine

## 2016-08-29 NOTE — Telephone Encounter (Signed)
Patient returned Melanie's call. °

## 2016-08-30 ENCOUNTER — Ambulatory Visit (INDEPENDENT_AMBULATORY_CARE_PROVIDER_SITE_OTHER): Payer: BLUE CROSS/BLUE SHIELD | Admitting: Internal Medicine

## 2016-08-30 ENCOUNTER — Encounter: Payer: Self-pay | Admitting: Internal Medicine

## 2016-08-30 ENCOUNTER — Other Ambulatory Visit: Payer: Self-pay | Admitting: Internal Medicine

## 2016-08-30 VITALS — BP 122/82 | HR 84 | Temp 98.0°F | Ht 66.0 in | Wt 192.2 lb

## 2016-08-30 DIAGNOSIS — R635 Abnormal weight gain: Secondary | ICD-10-CM | POA: Diagnosis not present

## 2016-08-30 DIAGNOSIS — L853 Xerosis cutis: Secondary | ICD-10-CM | POA: Diagnosis not present

## 2016-08-30 DIAGNOSIS — Z0001 Encounter for general adult medical examination with abnormal findings: Secondary | ICD-10-CM

## 2016-08-30 DIAGNOSIS — L659 Nonscarring hair loss, unspecified: Secondary | ICD-10-CM

## 2016-08-30 DIAGNOSIS — E039 Hypothyroidism, unspecified: Secondary | ICD-10-CM | POA: Diagnosis not present

## 2016-08-30 DIAGNOSIS — R1012 Left upper quadrant pain: Secondary | ICD-10-CM | POA: Diagnosis not present

## 2016-08-30 MED ORDER — SIMVASTATIN 20 MG PO TABS: 20.0000 mg | ORAL_TABLET | Freq: Every day | ORAL | 0 refills | Status: DC

## 2016-08-30 MED ORDER — LEVOTHYROXINE SODIUM 175 MCG PO TABS: 175.0000 ug | ORAL_TABLET | Freq: Every day | ORAL | 1 refills | Status: DC

## 2016-08-30 NOTE — Progress Notes (Signed)
Subjective:    Patient ID: Mary Schaefer, female    DOB: 11-05-1960, 56 y.o.   MRN: [id number]  HPI  Pt presents to the clinic today for her annual exam.  Flu: 03/2016 Tetanus: 10/2013 Pap Smear: 10/04/2015 Mammogram: 10/04/2015 Colon Screening: 04/2013 Vision Screening: annually Dentist: annually  Diet: She does eat meat. She consumes fruits and veggies daily. She does eat some fried foods. She drinks mostly water. Exercise: She walks for about 30 minutes 2-3 days per week.  She had her labs done prior to her appt. Her TSH was elevated but T4 was normal. I wasn't going to adjust her Synthroid but she comes in today with c/o weight gain, dry skin and her hair is falling out.   Review of Systems      Past Medical History:  Diagnosis Date  . Blood in stool   . Childhood asthma   . Endometriosis 1997  . Hypertension   . Thyroid disease     Current Outpatient Prescriptions  Medication Sig Dispense Refill  . hydrOXYzine (ATARAX/VISTARIL) 25 MG tablet TAKE 1 TABLET BY MOUTH THREE TIMES A DAY AS NEEDED    . levothyroxine (SYNTHROID, LEVOTHROID) 150 MCG tablet Take 1 tablet (150 mcg total) by mouth daily before breakfast. 90 tablet 3  . lisinopril-hydrochlorothiazide (PRINZIDE,ZESTORETIC) 10-12.5 MG tablet Take 1 tablet by mouth daily. 90 tablet 3  . NONFORMULARY OR COMPOUNDED ITEM Apply 1 application topically 3 (three) times daily. Diltiazem 2% ointment    . ondansetron (ZOFRAN-ODT) 8 MG disintegrating tablet Take 1 tablet (8 mg total) by mouth every 8 (eight) hours as needed for nausea. 15 tablet 0  . Sennosides-Docusate Sodium (STOOL SOFTENER LAXATIVE PO) Take 1 tablet by mouth daily as needed.    . simvastatin (ZOCOR) 20 MG tablet Take 1 tablet (20 mg total) by mouth at bedtime. 90 tablet 0   No current facility-administered medications for this visit.     Allergies  Allergen Reactions  . Aspirin Shortness Of Breath and Hives  . Penicillins Hives    Family History    Problem Relation Age of Onset  . Ovarian cancer Mother   . Hyperlipidemia Mother   . Alcohol abuse Father   . Hyperlipidemia Father   . Heart disease Father   . Stroke Father   . Hypertension Father     Social History   Social History  . Marital status: Unknown    Spouse name: N/A  . Number of children: N/A  . Years of education: N/A   Occupational History  . Not on file.   Social History Main Topics  . Smoking status: Never Smoker  . Smokeless tobacco: Never Used  . Alcohol use Yes     Comment: occasional  . Drug use: Yes  . Sexual activity: Yes   Other Topics Concern  . Not on file   Social History Narrative  . No narrative on file     Constitutional: Pt reports weight gain. Denies fever, malaise, fatigue, headache.  HEENT: Denies eye pain, eye redness, ear pain, ringing in the ears, wax buildup, runny nose, nasal congestion, bloody nose, or sore throat. Respiratory: Denies difficulty breathing, shortness of breath, cough or sputum production.   Cardiovascular: Denies chest pain, chest tightness, palpitations or swelling in the hands or feet.  Gastrointestinal: Pt reports anal fissure, constipation and blood in stool. Denies abdominal pain, bloating, diarrhea.  GU: Denies urgency, frequency, pain with urination, burning sensation, blood in urine, odor or discharge. Musculoskeletal:  Denies decrease in range of motion, difficulty with gait, muscle pain or joint pain and swelling.  Skin: Pt reports dry skin. Denies redness, rashes, lesions or ulcercations.  Neurological: Denies dizziness, difficulty with memory, difficulty with speech or problems with balance and coordination.  Psych: Denies anxiety, depression, SI/HI.  No other specific complaints in a complete review of systems (except as listed in HPI above).  Objective:   Physical Exam   BP 122/82   Pulse 84   Temp 98 F (36.7 C) (Oral)   Ht 5\' 6"  (1.676 m)   Wt 192 lb 4 oz (87.2 kg)   LMP 09/26/2015    SpO2 98%   BMI 31.03 kg/m   Wt Readings from Last 3 Encounters:  08/30/16 192 lb 4 oz (87.2 kg)  08/03/16 190 lb 12 oz (86.5 kg)  05/19/16 182 lb (82.6 kg)    General: Appears her stated age, obese in NAD. Skin: Warm, very dry and intact.  HEENT: Head: normal shape and size; Eyes: sclera white, no icterus, conjunctiva pink, PERRLA and EOMs intact; Ears: Tm's gray and intact, normal light reflex; Throat/Mouth: Teeth present, mucosa pink and moist, no exudate, lesions or ulcerations noted.  Neck:  Neck supple, trachea midline. No masses, lumps present.  Cardiovascular: Normal rate and rhythm. S1,S2 noted.  No murmur, rubs or gallops noted. No JVD or BLE edema. No carotid bruits noted. Pulmonary/Chest: Normal effort and positive vesicular breath sounds. No respiratory distress. No wheezes, rales or ronchi noted.  Abdomen: Soft and nontender. Normal bowel sounds. No distention or masses noted. Liver, spleen and kidneys non palpable. Musculoskeletal: Strength 5/5 BUE/BLE. No difficulty with gait.  Neurological: Alert and oriented. Cranial nerves II-XII grossly intact. Coordination normal.  Psychiatric: Mood and affect normal. Behavior is normal. Judgment and thought content normal.     BMET    Component Value Date/Time   NA 140 08/24/2016 0853   K 4.1 08/24/2016 0853   CL 102 08/24/2016 0853   CO2 28 08/24/2016 0853   GLUCOSE 154 (H) 08/24/2016 0853   BUN 16 08/24/2016 0853   CREATININE 0.80 08/24/2016 0853   CREATININE 0.87 03/31/2016 1606   CALCIUM 9.9 08/24/2016 0853    Lipid Panel     Component Value Date/Time   CHOL 218 (H) 08/24/2016 0853   TRIG 172.0 (H) 08/24/2016 0853   HDL 42.40 08/24/2016 0853   CHOLHDL 5 08/24/2016 0853   VLDL 34.4 08/24/2016 0853   LDLCALC 141 (H) 08/24/2016 0853    CBC No results found for: WBC, RBC, HGB, HCT, PLT, MCV, MCH, MCHC, RDW, LYMPHSABS, MONOABS, EOSABS, BASOSABS  Hgb A1C Lab Results  Component Value Date   HGBA1C 5.8 (H)  03/31/2016            Assessment & Plan:   Preventative Health Maintenance:  Flu and  Tetanus UTD Pap smear and mammogram UTD Colon screening UTD Encouraged her to see an eye doctor and dentist annually Labs from 1 week ago reviewed  Dry skin, weight gain, and hair falling out secondary to hypothyroidism:  RX for Synthroid 175 mcg daily provided today  RTC in 3 months for lab only, CMET, Lipid, TSH and T4 Tanara Turvey, NP

## 2016-08-30 NOTE — Patient Instructions (Signed)
Health Maintenance, Female Adopting a healthy lifestyle and getting preventive care can go a long way to promote health and wellness. Talk with your health care provider about what schedule of regular examinations is right for you. This is a good chance for you to check in with your provider about disease prevention and staying healthy. In between checkups, there are plenty of things you can do on your own. Experts have done a lot of research about which lifestyle changes and preventive measures are most likely to keep you healthy. Ask your health care provider for more information. Weight and diet Eat a healthy diet  Be sure to include plenty of vegetables, fruits, low-fat dairy products, and lean protein.  Do not eat a lot of foods high in solid fats, added sugars, or salt.  Get regular exercise. This is one of the most important things you can do for your health.  Most adults should exercise for at least 150 minutes each week. The exercise should increase your heart rate and make you sweat (moderate-intensity exercise).  Most adults should also do strengthening exercises at least twice a week. This is in addition to the moderate-intensity exercise. Maintain a healthy weight  Body mass index (BMI) is a measurement that can be used to identify possible weight problems. It estimates body fat based on height and weight. Your health care provider can help determine your BMI and help you achieve or maintain a healthy weight.  For females 55 years of age and older:  A BMI below 18.5 is considered underweight.  A BMI of 18.5 to 24.9 is normal.  A BMI of 25 to 29.9 is considered overweight.  A BMI of 30 and above is considered obese. Watch levels of cholesterol and blood lipids  You should start having your blood tested for lipids and cholesterol at 55 years of age, then have this test every 5 years.  You may need to have your cholesterol levels checked more often if:  Your lipid or  cholesterol levels are high.  You are older than 55 years of age.  You are at high risk for heart disease. Cancer screening Lung Cancer  Lung cancer screening is recommended for adults 55-42 years old who are at high risk for lung cancer because of a history of smoking.  A yearly low-dose CT scan of the lungs is recommended for people who:  Currently smoke.  Have quit within the past 15 years.  Have at least a 30-pack-year history of smoking. A pack year is smoking an average of one pack of cigarettes a day for 1 year.  Yearly screening should continue until it has been 15 years since you quit.  Yearly screening should stop if you develop a health problem that would prevent you from having lung cancer treatment. Breast Cancer  Practice breast self-awareness. This means understanding how your breasts normally appear and feel.  It also means doing regular breast self-exams. Let your health care provider know about any changes, no matter how small.  If you are in your 20s or 30s, you should have a clinical breast exam (CBE) by a health care provider every 1-3 years as part of a regular health exam.  If you are 34 or older, have a CBE every year. Also consider having a breast X-ray (mammogram) every year.  If you have a family history of breast cancer, talk to your health care provider about genetic screening.  If you are at high risk for breast cancer, talk  to your health care provider about having an MRI and a mammogram every year.  Breast cancer gene (BRCA) assessment is recommended for women who have family members with BRCA-related cancers. BRCA-related cancers include:  Breast.  Ovarian.  Tubal.  Peritoneal cancers.  Results of the assessment will determine the need for genetic counseling and BRCA1 and BRCA2 testing. Cervical Cancer  Your health care provider may recommend that you be screened regularly for cancer of the pelvic organs (ovaries, uterus, and vagina).  This screening involves a pelvic examination, including checking for microscopic changes to the surface of your cervix (Pap test). You may be encouraged to have this screening done every 3 years, beginning at age 55.  For women ages 55-65, health care providers may recommend pelvic exams and Pap testing every 3 years, or they may recommend the Pap and pelvic exam, combined with testing for human papilloma virus (HPV), every 5 years. Some types of HPV increase your risk of cervical cancer. Testing for HPV may also be done on women of any age with unclear Pap test results.  Other health care providers may not recommend any screening for nonpregnant women who are considered low risk for pelvic cancer and who do not have symptoms. Ask your health care provider if a screening pelvic exam is right for you.  If you have had past treatment for cervical cancer or a condition that could lead to cancer, you need Pap tests and screening for cancer for at least 20 years after your treatment. If Pap tests have been discontinued, your risk factors (such as having a new sexual partner) need to be reassessed to determine if screening should resume. Some women have medical problems that increase the chance of getting cervical cancer. In these cases, your health care provider may recommend more frequent screening and Pap tests. Colorectal Cancer  This type of cancer can be detected and often prevented.  Routine colorectal cancer screening usually begins at 55 years of age and continues through 55 years of age.  Your health care provider may recommend screening at an earlier age if you have risk factors for colon cancer.  Your health care provider may also recommend using home test kits to check for hidden blood in the stool.  A small camera at the end of a tube can be used to examine your colon directly (sigmoidoscopy or colonoscopy). This is done to check for the earliest forms of colorectal cancer.  Routine  screening usually begins at age 55.  Direct examination of the colon should be repeated every 5-10 years through 55 years of age. However, you may need to be screened more often if early forms of precancerous polyps or small growths are found. Skin Cancer  Check your skin from head to toe regularly.  Tell your health care provider about any new moles or changes in moles, especially if there is a change in a mole's shape or color.  Also tell your health care provider if you have a mole that is larger than the size of a pencil eraser.  Always use sunscreen. Apply sunscreen liberally and repeatedly throughout the day.  Protect yourself by wearing long sleeves, pants, a wide-brimmed hat, and sunglasses whenever you are outside. Heart disease, diabetes, and high blood pressure  High blood pressure causes heart disease and increases the risk of stroke. High blood pressure is more likely to develop in:  People who have blood pressure in the high end of the normal range (130-139/85-89 mm Hg).  People who are overweight or obese.  People who are African American.  If you are 56-63 years of age, have your blood pressure checked every 3-5 years. If you are 7 years of age or older, have your blood pressure checked every year. You should have your blood pressure measured twice--once when you are at a hospital or clinic, and once when you are not at a hospital or clinic. Record the average of the two measurements. To check your blood pressure when you are not at a hospital or clinic, you can use:  An automated blood pressure machine at a pharmacy.  A home blood pressure monitor.  If you are between 37 years and 8 years old, ask your health care provider if you should take aspirin to prevent strokes.  Have regular diabetes screenings. This involves taking a blood sample to check your fasting blood sugar level.  If you are at a normal weight and have a low risk for diabetes, have this test once  every three years after 55 years of age.  If you are overweight and have a high risk for diabetes, consider being tested at a younger age or more often. Preventing infection Hepatitis B  If you have a higher risk for hepatitis B, you should be screened for this virus. You are considered at high risk for hepatitis B if:  You were born in a country where hepatitis B is common. Ask your health care provider which countries are considered high risk.  Your parents were born in a high-risk country, and you have not been immunized against hepatitis B (hepatitis B vaccine).  You have HIV or AIDS.  You use needles to inject street drugs.  You live with someone who has hepatitis B.  You have had sex with someone who has hepatitis B.  You get hemodialysis treatment.  You take certain medicines for conditions, including cancer, organ transplantation, and autoimmune conditions. Hepatitis C  Blood testing is recommended for:  Everyone born from [birth year] through 1964.  Anyone with known risk factors for hepatitis C. Sexually transmitted infections (STIs)  You should be screened for sexually transmitted infections (STIs) including gonorrhea and chlamydia if:  You are sexually active and are younger than 55 years of age.  You are older than 55 years of age and your health care provider tells you that you are at risk for this type of infection.  Your sexual activity has changed since you were last screened and you are at an increased risk for chlamydia or gonorrhea. Ask your health care provider if you are at risk.  If you do not have HIV, but are at risk, it may be recommended that you take a prescription medicine daily to prevent HIV infection. This is called pre-exposure prophylaxis (PrEP). You are considered at risk if:  You are sexually active and do not regularly use condoms or know the HIV status of your partner(s).  You take drugs by injection.  You are sexually active with a partner  who has HIV. Talk with your health care provider about whether you are at high risk of being infected with HIV. If you choose to begin PrEP, you should first be tested for HIV. You should then be tested every 3 months for as long as you are taking PrEP. Pregnancy  If you are premenopausal and you may become pregnant, ask your health care provider about preconception counseling.  If you may become pregnant, take 400 to 800 micrograms (mcg) of folic acid  every day.  If you want to prevent pregnancy, talk to your health care provider about birth control (contraception). Osteoporosis and menopause  Osteoporosis is a disease in which the bones lose minerals and strength with aging. This can result in serious bone fractures. Your risk for osteoporosis can be identified using a bone density scan.  If you are 4 years of age or older, or if you are at risk for osteoporosis and fractures, ask your health care provider if you should be screened.  Ask your health care provider whether you should take a calcium or vitamin D supplement to lower your risk for osteoporosis.  Menopause may have certain physical symptoms and risks.  Hormone replacement therapy may reduce some of these symptoms and risks. Talk to your health care provider about whether hormone replacement therapy is right for you. Follow these instructions at home:  Schedule regular health, dental, and eye exams.  Stay current with your immunizations.  Do not use any tobacco products including cigarettes, chewing tobacco, or electronic cigarettes.  If you are pregnant, do not drink alcohol.  If you are breastfeeding, limit how much and how often you drink alcohol.  Limit alcohol intake to no more than 1 drink per day for nonpregnant women. One drink equals 12 ounces of beer, 5 ounces of wine, or 1 ounces of hard liquor.  Do not use street drugs.  Do not share needles.  Ask your health care provider for help if you need support  or information about quitting drugs.  Tell your health care provider if you often feel depressed.  Tell your health care provider if you have ever been abused or do not feel safe at home. This information is not intended to replace advice given to you by your health care provider. Make sure you discuss any questions you have with your health care provider. Document Released: 12/12/2010 Document Revised: 11/04/2015 Document Reviewed: 03/02/2015 Elsevier Interactive Patient Education  2017 [employer].

## 2016-08-31 DIAGNOSIS — K602 Anal fissure, unspecified: Secondary | ICD-10-CM | POA: Diagnosis not present

## 2016-09-01 DIAGNOSIS — H10413 Chronic giant papillary conjunctivitis, bilateral: Secondary | ICD-10-CM | POA: Diagnosis not present

## 2016-09-21 DIAGNOSIS — N912 Amenorrhea, unspecified: Secondary | ICD-10-CM | POA: Diagnosis not present

## 2016-09-21 DIAGNOSIS — N950 Postmenopausal bleeding: Secondary | ICD-10-CM | POA: Diagnosis not present

## 2016-09-29 ENCOUNTER — Ambulatory Visit (INDEPENDENT_AMBULATORY_CARE_PROVIDER_SITE_OTHER): Payer: BLUE CROSS/BLUE SHIELD | Admitting: Internal Medicine

## 2016-09-29 ENCOUNTER — Encounter: Payer: Self-pay | Admitting: Internal Medicine

## 2016-09-29 VITALS — BP 110/70 | HR 85 | Temp 98.0°F | Wt 192.0 lb

## 2016-09-29 DIAGNOSIS — R220 Localized swelling, mass and lump, head: Secondary | ICD-10-CM | POA: Diagnosis not present

## 2016-09-29 DIAGNOSIS — R0602 Shortness of breath: Secondary | ICD-10-CM | POA: Diagnosis not present

## 2016-09-29 DIAGNOSIS — L509 Urticaria, unspecified: Secondary | ICD-10-CM

## 2016-09-29 DIAGNOSIS — M7989 Other specified soft tissue disorders: Secondary | ICD-10-CM

## 2016-09-29 NOTE — Progress Notes (Signed)
Subjective:    Patient ID: Mary Schaefer, female    DOB: Jun 22, 1960, 56 y.o.   MRN: [id number]  HPI  Pt presents to the clinic today to follow up intermittent hives. This has been going on now for almost 4 months. It is intermittent. It only occurs at night, never during the day. She previously had not noticed any associated symptoms but reports over the last week, she has started noticing swelling of her hands and face. She has also had mild shortness of breath. She denies chest pain. She has been trying to pay attention but has not been able to find any contributing factors. She has been taking an antihistamine OTC daily and Hydroxyzine as needed for itching. She is interested in a referral to an allergist at this time. She is currently not having any symptoms at this time.   Review of Systems  Past Medical History:  Diagnosis Date  . Blood in stool   . Childhood asthma   . Endometriosis 1997  . Hypertension   . Thyroid disease     Current Outpatient Prescriptions  Medication Sig Dispense Refill  . hydrOXYzine (ATARAX/VISTARIL) 25 MG tablet TAKE 1 TABLET BY MOUTH THREE TIMES A DAY AS NEEDED    . levothyroxine (SYNTHROID, LEVOTHROID) 175 MCG tablet Take 1 tablet (175 mcg total) by mouth daily before breakfast. 90 tablet 1  . lisinopril-hydrochlorothiazide (PRINZIDE,ZESTORETIC) 10-12.5 MG tablet Take 1 tablet by mouth daily. 90 tablet 3  . NONFORMULARY OR COMPOUNDED ITEM Apply 1 application topically 3 (three) times daily. Diltiazem 2% ointment    . ondansetron (ZOFRAN-ODT) 8 MG disintegrating tablet Take 1 tablet (8 mg total) by mouth every 8 (eight) hours as needed for nausea. 15 tablet 0  . Sennosides-Docusate Sodium (STOOL SOFTENER LAXATIVE PO) Take 1 tablet by mouth daily as needed.    . simvastatin (ZOCOR) 20 MG tablet Take 1 tablet (20 mg total) by mouth at bedtime. 90 tablet 0   No current facility-administered medications for this visit.     Allergies  Allergen Reactions   . Aspirin Shortness Of Breath and Hives  . Penicillins Hives    Family History  Problem Relation Age of Onset  . Ovarian cancer Mother   . Hyperlipidemia Mother   . Alcohol abuse Father   . Hyperlipidemia Father   . Heart disease Father   . Stroke Father   . Hypertension Father     Social History   Social History  . Marital status: Unknown    Spouse name: N/A  . Number of children: N/A  . Years of education: N/A   Occupational History  . Not on file.   Social History Main Topics  . Smoking status: Never Smoker  . Smokeless tobacco: Never Used  . Alcohol use Yes     Comment: occasional  . Drug use: Yes  . Sexual activity: Yes   Other Topics Concern  . Not on file   Social History Narrative  . No narrative on file     Constitutional: Denies fever, malaise, fatigue, headache or abrupt weight changes.  HEENT: Pt reports intermittent swelling around eyes. Denies eye pain, eye redness, ear pain, ringing in the ears, wax buildup, runny nose, nasal congestion, bloody nose, or sore throat. Respiratory: Pt reports shortness of breath at times. Denies difficulty breathing, cough or sputum production.   Cardiovascular: Denies chest pain, chest tightness, palpitations or swelling in the hands or feet.  Gastrointestinal: Denies abdominal pain, bloating, constipation, diarrhea or  blood in the stool.  GU: Denies urgency, frequency, pain with urination, burning sensation, blood in urine, odor or discharge. Musculoskeletal: Pt reports intermittent swelling of hands. Denies decrease in range of motion, difficulty with gait, muscle pain or joint pain.  Skin: Pt reports intermittent hives. Denies redness, rashes, lesions or ulcercations.  Neurological: Denies dizziness, difficulty with memory, difficulty with speech or problems with balance and coordination.  Psych: Denies anxiety, depression, SI/HI.  No other specific complaints in a complete review of systems (except as listed in  HPI above).     Objective:   Physical Exam   BP 110/70 (BP Location: Left Arm, Patient Position: Sitting, Cuff Size: Large)   Pulse 85   Temp 98 F (36.7 C) (Oral)   Wt 192 lb (87.1 kg)   SpO2 97%   BMI 30.99 kg/m  Wt Readings from Last 3 Encounters:  09/29/16 192 lb (87.1 kg)  08/30/16 192 lb 4 oz (87.2 kg)  08/03/16 190 lb 12 oz (86.5 kg)    General: Appears her stated age, in NAD. Skin: Warm, dry and intact.  HEENT: Eyes: sclera white, no icterus, conjunctiva pink; Throat/Mouth: Teeth present, mucosa pink and moist, no exudate, lesions or ulcerations noted.  Pulmonary/Chest: Normal effort and positive vesicular breath sounds. No respiratory distress. No wheezes, rales or ronchi noted.  Musculoskeletal: No signs of hand swelling at this time.   BMET    Component Value Date/Time   NA 140 08/24/2016 0853   K 4.1 08/24/2016 0853   CL 102 08/24/2016 0853   CO2 28 08/24/2016 0853   GLUCOSE 154 (H) 08/24/2016 0853   BUN 16 08/24/2016 0853   CREATININE 0.80 08/24/2016 0853   CREATININE 0.87 03/31/2016 1606   CALCIUM 9.9 08/24/2016 0853    Lipid Panel     Component Value Date/Time   CHOL 218 (H) 08/24/2016 0853   TRIG 172.0 (H) 08/24/2016 0853   HDL 42.40 08/24/2016 0853   CHOLHDL 5 08/24/2016 0853   VLDL 34.4 08/24/2016 0853   LDLCALC 141 (H) 08/24/2016 0853    CBC No results found for: WBC, RBC, HGB, HCT, PLT, MCV, MCH, MCHC, RDW, LYMPHSABS, MONOABS, EOSABS, BASOSABS  Hgb A1C Lab Results  Component Value Date   HGBA1C 5.8 (H) 03/31/2016           Assessment & Plan:   Urticaria, Hand Swelling, Swelling around Eyes, Shortness of Breath:  Will stop ACEI  Return to clinic in 2 weeks for BP check Continue daily antihistamine and Hydroxyzine prn Referral to allergy placed- see Rosaria Ferries on the way out to schedule  RTC in 2 weeks for BP check Sandrina Heaton, NP

## 2016-09-29 NOTE — Patient Instructions (Signed)
Angioedema  Angioedema is sudden swelling in the body. The swelling can happen in any part of the body. It often happens on the skin and causes itchy, bumpy patches (hives) to form.  This condition may:  · Happen only one time.  · Happen more than one time. It may come back at random times.  · Keep coming back for a number of years. Someday it may stop coming back.    Follow these instructions at home:  · Take over-the-counter and prescription medicines only as told by your doctor.  · If you were given medicines for emergency allergy treatment, always carry them with you.  · Wear a medical bracelet as told by your doctor.  · Avoid the things that cause your attacks (triggers).  · If this condition was passed to you from your parents and you want to have kids, talk to your doctor. Your kids may also have this condition.  Contact a doctor if:  · You have another attack.  · Your attacks happen more often, even after you take steps to prevent them.  · This condition was passed to you by your parents and you want to have kids.  Get help right away if:  · Your mouth, tongue, or lips get very swollen.  · You have trouble breathing.  · You have trouble swallowing.  · You pass out (faint).  This information is not intended to replace advice given to you by your health care provider. Make sure you discuss any questions you have with your health care provider.  Document Released: 05/17/2009 Document Revised: 12/29/2015 Document Reviewed: 12/07/2015  Elsevier Interactive Patient Education © 2017 [employer]

## 2016-10-04 ENCOUNTER — Telehealth: Payer: Self-pay

## 2016-10-04 DIAGNOSIS — E039 Hypothyroidism, unspecified: Secondary | ICD-10-CM

## 2016-10-04 DIAGNOSIS — E6609 Other obesity due to excess calories: Secondary | ICD-10-CM

## 2016-10-04 DIAGNOSIS — E7800 Pure hypercholesterolemia, unspecified: Secondary | ICD-10-CM

## 2016-10-04 DIAGNOSIS — I10 Essential (primary) hypertension: Secondary | ICD-10-CM

## 2016-10-04 NOTE — Telephone Encounter (Signed)
Mary Schaefer, how do I place this referral?

## 2016-10-04 NOTE — Telephone Encounter (Signed)
Pt left v/m requesting referral to [facility] for wt loss. Pt last annual 08/30/16.Please advise.

## 2016-10-05 NOTE — Telephone Encounter (Signed)
Medical Nutrition Therapy Referral. Use all pertinent DX in your referral.

## 2016-10-05 NOTE — Telephone Encounter (Signed)
Referral placed.

## 2016-10-05 NOTE — Addendum Note (Signed)
Addended by: Jearld Fenton on: 10/05/2016 08:56 AM   Modules accepted: Orders

## 2016-10-20 ENCOUNTER — Encounter: Payer: Self-pay | Admitting: Dietician

## 2016-10-20 ENCOUNTER — Encounter: Payer: BLUE CROSS/BLUE SHIELD | Attending: Internal Medicine | Admitting: Dietician

## 2016-10-20 VITALS — Ht 66.0 in | Wt 193.0 lb

## 2016-10-20 DIAGNOSIS — Z6831 Body mass index (BMI) 31.0-31.9, adult: Secondary | ICD-10-CM

## 2016-10-20 DIAGNOSIS — E6609 Other obesity due to excess calories: Secondary | ICD-10-CM | POA: Diagnosis not present

## 2016-10-20 DIAGNOSIS — E785 Hyperlipidemia, unspecified: Secondary | ICD-10-CM

## 2016-10-20 NOTE — Patient Instructions (Addendum)
-   Choose red meats less often to help lower cholesterol levels. If buying red meat, try to choose leaner cuts such as 90/10 or 97/3  - Remember to focus on getting the right types of fat in your diet. Poly and Mono Unsaturated fats are best   - Try using half the amount of salt you usually wound when cooking. Mrs Mary Schaefer has several different salt-free varieties!  - Try to include whole grain products at least half the time. This will give you more fiber and more nutrients!

## 2016-10-20 NOTE — Progress Notes (Signed)
Medical Nutrition Therapy: Visit start time: 0845  end time: 0945  Assessment:  Diagnosis: HLD, Obesity, HTN Past medical history: Prediabetes, Hypothyroidism, see chart Psychosocial issues/ stress concerns: none Preferred learning method:  . Visual  Current weight: 193lb  Height: 5\' 6"  Medications, supplements: Synthroid, MVI, see chart  Progress and evaluation: Patient's main desire is weight loss to help lower blood lipid levels and improve other health-related concerns. Would also like to be able to stop taking cholesterol medicine. Reports h/o 30# wt loss last year, intentional, using [employer] program. Saw success with the program but was not motivated to continue lifestyle changes on her own. Reports this to be the biggest barrier to weight loss currently. Describes knowing what to do but does not hold herself accountable. States goal wt of 150# which she was last at 23yrs ago. Dietary questionnaire reveals patient to use margarine or olive oil when cooking, eat regular fat snack foods and sour cream/ Mayo, cook with salt and add salt to foods, limits amount of fried foods consumed. Reports good knowledge of proper portion sizes though isn't currently consistent with practicing control. Convenience and quickness are important to her when it comes to meal choices.    Physical activity: Non currently. Walked every day last year; had a FitBit to track steps. Desires to get back to this practice and plans to start now that weather has improved. Will walk and play tennis with her husband who has his own weight loss goals.  Dietary Intake:  Usual eating pattern includes 3 meals and 2-3 snacks per day. Dining out frequency: 2 meals per week.  Breakfast: PB sandwich Snack: nuts, fruit Lunch: salad, [place], chinese Snack: popcorn or other small snack Supper: chicken, steak, pasta Snack: sugar free popsicles, ice cream Beverages: coffee with creamer, sparkling water  Nutrition Care  Education: Topics covered: how to lower total cholesterol, TG levels, LDL and raise HDL, healthy wt loss rate, choosing low fat dairy products, quick and easy meals, plant based eating, smartphone apps for tracking calories Basic nutrition: basic food groups, appropriate nutrient balance, general nutrition guidelines Weight control: benefits of weight control, behavioral changes for weight loss Advanced nutrition: cooking techniques, dining out, food label reading Hypertension:  identifying high sodium foods Hyperlipidemia:  target goals for lipids, healthy and unhealthy fats, role of fiber Other lifestyle changes:  benefits of making changes, increasing motivation, readiness for change, identifying habits that need to change  Nutritional Diagnosis:  NI-5.6.3 Inappropriate intake of fats (specify): saturated and trans fats As related to dietary choices.  As evidenced by elevated TG, LDL and Total Cholesterol lab values.  Intervention: Discussion as noted above. Resident was provided goals to work on until follow up session in two weeks. Encouraged to work on 1 small goal at a time, applied to both dietary changes and weight loss strategies.  Education Materials given:  [name] Quick and [employer] handout . General diet guidelines for Cholesterol-lowering/ Heart health: "Fat and Cholesterol" sheet . Goals/ instructions  Learner/ who was taught:  . Patient  Level of understanding: [name] Verbalizes/ demonstrates competency  Demonstrated degree of understanding via:   Teach back Learning barriers: . None  Willingness to learn/ readiness for change: . Acceptance, ready for change  Monitoring and Evaluation:  Dietary intake, exercise, ratio of types of fats in the diet, and body weight      follow up: in 2 week(s)

## 2016-11-03 ENCOUNTER — Encounter (INDEPENDENT_AMBULATORY_CARE_PROVIDER_SITE_OTHER): Payer: BLUE CROSS/BLUE SHIELD | Admitting: Dietician

## 2016-11-03 VITALS — Wt 188.8 lb

## 2016-11-03 DIAGNOSIS — E785 Hyperlipidemia, unspecified: Secondary | ICD-10-CM | POA: Diagnosis not present

## 2016-11-03 DIAGNOSIS — Z6831 Body mass index (BMI) 31.0-31.9, adult: Secondary | ICD-10-CM

## 2016-11-03 DIAGNOSIS — E6609 Other obesity due to excess calories: Secondary | ICD-10-CM | POA: Diagnosis not present

## 2016-11-03 NOTE — Patient Instructions (Signed)
-   Follow provided daily calorie goal.  - Continue to work on previous interventions, awesome work!

## 2016-11-03 NOTE — Progress Notes (Signed)
Medical Nutrition Therapy: Visit start time: 0900  end time: 0930  Assessment:  Diagnosis: HLD, obesity, HTN Medical history changes: none Psychosocial issues/ stress concerns: none  Current weight: 188.8lb  Height: 5\' 6"  Medications, supplement changes: none  Progress and evaluation: Patient has experienced weight loss of 4.2# x2 weeks following a 1200kcal/day diet as estimated by the Myfitness Pal app. She reports to be using this program to record and track daily caloric intake as well as ratio of different types of fat. Interventions since initial visit include: eating less starchy carbohydrates and more whole grains/ fruits, eating more protein (less red meat and more shrimp, salmon and chicken), adding [place] yogurt as a snack, eliminating fried foods, asking for no oil or butter on foods if eating out, cooking with canola or olive oil at home, cooking with less salt (bought Mrs. Dash and likes it) and more herbs, and asking for low-fat salad dressings on the side. Also reports to have started an exercise routine; walks 2min 6x/week outside or at a local gym if the weather is poor. Has increased the amount she is up and moving throughout the day as a whole. New calorie goal of 1671-1800kcal provided for slower, more sustainable weight loss which she programmed into her fitness app.  Physical activity: 28min walking 6x/week at 20 min mile pace. Aims for 10,000 steps/day tracked with FitBit  Dietary Intake:  Usual eating pattern includes 3 meals and 2-3 snacks per day. Dining out frequency: 2 meals per week.  Breakfast: did not discuss Snack: fruit Lunch: salads with low-fat dressing on the side Snack: greek yogurt Supper: grilled chicken, salmon, shrimp, less starchy carbohydrates Snack: SF popsicles Beverages: did not discuss  Nutrition Care Education: Topics covered: *Food Diary (simple)Weight management guidelineslow fat or low sodium Basic nutrition: basic food groups, appropriate  nutrient balance, general nutrition guidelines Weight control: determining reasonable weight goal, behavioral changes for weight loss Advanced nutrition: cooking techniques, dining out Hyperlipidemia: healthy and unhealthy fats, role of fiber food   Nutritional Diagnosis:  NI-1.4 Inadequate energy intake As related to MyFitness Pal app default calculation.  As evidenced by 1200kcal/day diet plan that patient has been following with report of increased hunger.  Intervention: Discussion as noted above. Patient has worked on all previously discussed goals and has seen weight loss since initial visit. New daily calorie goal was provided with plans to continue other previously established goals.  Education Materials given:  . Breakdown of estimated calories needed per day for weight loss of .5-1#/wk  Learner/ who was taught:  . Patient  Level of understanding: [name] Verbalizes/ demonstrates competency  Demonstrated degree of understanding via:   Teach back Learning barriers: . None  Willingness to learn/ readiness for change: . Eager, change in progress  Monitoring and Evaluation:  Dietary intake, exercise, and body weight      follow up: in 3 week(s)

## 2016-11-10 DIAGNOSIS — J301 Allergic rhinitis due to pollen: Secondary | ICD-10-CM | POA: Diagnosis not present

## 2016-11-10 DIAGNOSIS — R05 Cough: Secondary | ICD-10-CM | POA: Diagnosis not present

## 2016-11-10 DIAGNOSIS — J3081 Allergic rhinitis due to animal (cat) (dog) hair and dander: Secondary | ICD-10-CM | POA: Diagnosis not present

## 2016-11-10 DIAGNOSIS — J3089 Other allergic rhinitis: Secondary | ICD-10-CM | POA: Diagnosis not present

## 2016-11-24 ENCOUNTER — Encounter: Payer: Self-pay | Admitting: Family Medicine

## 2016-11-24 ENCOUNTER — Encounter: Payer: BLUE CROSS/BLUE SHIELD | Attending: Internal Medicine | Admitting: Dietician

## 2016-11-24 ENCOUNTER — Encounter: Payer: Self-pay | Admitting: Dietician

## 2016-11-24 ENCOUNTER — Ambulatory Visit (INDEPENDENT_AMBULATORY_CARE_PROVIDER_SITE_OTHER): Payer: BLUE CROSS/BLUE SHIELD | Admitting: Family Medicine

## 2016-11-24 VITALS — Wt 183.2 lb

## 2016-11-24 VITALS — BP 128/82 | HR 78 | Temp 98.2°F | Wt 183.0 lb

## 2016-11-24 DIAGNOSIS — D229 Melanocytic nevi, unspecified: Secondary | ICD-10-CM | POA: Insufficient documentation

## 2016-11-24 DIAGNOSIS — E785 Hyperlipidemia, unspecified: Secondary | ICD-10-CM

## 2016-11-24 NOTE — Progress Notes (Signed)
Medical Nutrition Therapy: Visit start time: 0900  end time: 0930  Assessment:  Diagnosis: HLD Medical history changes: none Psychosocial issues/ stress concerns: none  Current weight: 183.2lb  Height: 5\' 6"  Medications, supplement changes: antihistamine for hives, Vitamin B12, MVI, Vit D3  Progress and evaluation: Patient has lost a total of 9.8# since (10/20/16). BMI: 29.56- overweight/ no longer considered obese. She continues to track calories on FitBit and has set a calorie goal of 1650kcal/day. Previous kcal recommendations were between 1671-1800kcal/day. Continues to choose complex over simple carbohydrates most of the time (still tends to gravitate towards carbs overall), eat lower-sodium foods and using Mrs. Dash salt-free products when cooking, ask for salad dressings on the side, eat non-fried foods, choosing mostly seafood when out to eat. Avoids high intake of shrimp d/t high cholesterol content. Desires more ideas for dietary sources of protein. States she is going on vacation x2 weeks and is going with a friend who is also trying to lose weight. They plan to cook healthy recipes, eat seafood when at restaurants, and walk on the beach. Based on rate of weight loss and current caloric intake, it is advisable for patient to include additional kcal in her diet (~100-200kcal/day) to put her more in the recommended range for slower and more sustainable weight loss. She may take advantage of this on vacation. Does not report being overly hungry or to have increased food cravings on current kcal consumption.  Physical activity: continues to walk 6 days/week for 66min and aims to get 10,000 steps/day tracked with FitBit. Has an elliptical trainer at home that she has started to use for approx. 10 min to build cardiovascular endurance   Dietary Intake:  Usual eating pattern includes 3 meals and 2-3 snacks per day. Dining out frequency: 2 meals per week.  Breakfast: new option: Dave's killer bread  + low sugar PB Snack: unchanged Lunch: unchanged Snack: unchanged Supper: unchanged Snack: unchanged Beverages: unchanged  Nutrition Care Education: Topics covered: *Food Diary Weight management guidelineslow sodium, dietary sources of protein, types of exercise, including more calories (100-200kcal) d/t being slightly below recommended kcal range for slow and sustainable weight loss Weight control: behavioral changes for weight loss Advanced nutrition: dining out Other lifestyle changes:  benefits of making changes  Nutritional Diagnosis:  [place]-3.3 Overweight/obesity As related to previous lifestyle habits.  As evidenced by BMI 29.56.  Intervention: Discussion as noted above. Patient continues to see weight loss and continues to track caloric intake and activity daily. She is to allow herself some flexibility with diet while on vacation using the 80/20 rule, remain active, and return to her normal routine when she returns home. Follow up in 4 weeks.  Learner/ who was taught:  . Patient  Level of understanding: [name] Verbalizes/ demonstrates competency  Demonstrated degree of understanding via:   Teach back Learning barriers: . None  Willingness to learn/ readiness for change: . Eager, change in progress  Monitoring and Evaluation:  Dietary intake, exercise, and body weight      follow up: in 4 week(s)

## 2016-11-24 NOTE — Patient Instructions (Signed)
-   While on 2 week beach vacation continue to walk and plan most meals as usual. Use your friend as a resource, but also allow yourself time to relax and eat foods and beverages you enjoy.  - Consider increasing calories by 100-200kcal/day, as you are at the low end of your estimated daily caloric needs for weight loss.

## 2016-11-24 NOTE — Progress Notes (Signed)
   BP 128/82   Pulse 78   Temp 98.2 F (36.8 C) (Oral)   Wt 183 lb (83 kg)   SpO2 98%   BMI 29.54 kg/m    CC: check R hip lesion Subjective:    Patient ID: Mary Schaefer, female    DOB: November 10, 1960, 56 y.o.   MRN: [id number]  HPI: KATENA PETITJEAN is a 56 y.o. female presenting on 11/24/2016 for Mass (hip. )   Noticed itchy spot on right lateral hip. Unsure if tick bite. Has seen ticks but no recent tick bites.   No fevers/chills, new rashes, joint pains or headache.   Relevant past medical, surgical, family and social history reviewed and updated as indicated. Interim medical history since our last visit reviewed. Allergies and medications reviewed and updated. Outpatient Medications Prior to Visit  Medication Sig Dispense Refill  . BEPREVE 1.5 % SOLN PLACE 1 DROP INTO BOTH EYES TWICE DAILY AS NEEDED FOR ALLERGIES  3  . hydrOXYzine (ATARAX/VISTARIL) 25 MG tablet TAKE 1 TABLET BY MOUTH THREE TIMES A DAY AS NEEDED    . levothyroxine (SYNTHROID, LEVOTHROID) 175 MCG tablet Take 1 tablet (175 mcg total) by mouth daily before breakfast. 90 tablet 1  . Multiple Vitamin (MULTIVITAMIN) tablet Take 1 tablet by mouth daily.    . NONFORMULARY OR COMPOUNDED ITEM Apply 1 application topically 3 (three) times daily. Diltiazem 2% ointment    . ondansetron (ZOFRAN-ODT) 8 MG disintegrating tablet Take 1 tablet (8 mg total) by mouth every 8 (eight) hours as needed for nausea. 15 tablet 0  . Sennosides-Docusate Sodium (STOOL SOFTENER LAXATIVE PO) Take 1 tablet by mouth daily as needed.    . simvastatin (ZOCOR) 20 MG tablet Take 1 tablet (20 mg total) by mouth at bedtime. 90 tablet 0  . lisinopril-hydrochlorothiazide (PRINZIDE,ZESTORETIC) 10-12.5 MG tablet Take 1 tablet by mouth daily. 90 tablet 3   No facility-administered medications prior to visit.      Per HPI unless specifically indicated in ROS section below Review of Systems     Objective:    BP 128/82   Pulse 78   Temp 98.2 F (36.8  C) (Oral)   Wt 183 lb (83 kg)   SpO2 98%   BMI 29.54 kg/m   Wt Readings from Last 3 Encounters:  11/24/16 183 lb (83 kg)  11/24/16 183 lb 3.2 oz (83.1 kg)  11/03/16 188 lb 12.8 oz (85.6 kg)    Physical Exam  Constitutional: She appears well-developed and well-nourished. No distress.  Skin: Skin is warm and dry. No rash noted. No erythema.  Small raised benign appearing mole anterior R hip region with slight scabbing present  Nursing note and vitals reviewed.     Assessment & Plan:   Problem List Items Addressed This Visit    Irritated nevus - Primary    Anticipate benign appearing slightly irritated mole. rec treat with OTC cortisone-10. Update if not improving with treatment. No tick.           Follow up plan: No Follow-up on file.  Ria Bush, MD

## 2016-11-24 NOTE — Assessment & Plan Note (Addendum)
Anticipate benign appearing slightly irritated mole. rec treat with OTC cortisone-10. Update if not improving with treatment. No tick.

## 2016-11-27 DIAGNOSIS — Z8041 Family history of malignant neoplasm of ovary: Secondary | ICD-10-CM | POA: Diagnosis not present

## 2016-11-27 DIAGNOSIS — Z6829 Body mass index (BMI) 29.0-29.9, adult: Secondary | ICD-10-CM | POA: Diagnosis not present

## 2016-11-27 DIAGNOSIS — Z124 Encounter for screening for malignant neoplasm of cervix: Secondary | ICD-10-CM | POA: Diagnosis not present

## 2016-11-27 DIAGNOSIS — R8290 Unspecified abnormal findings in urine: Secondary | ICD-10-CM | POA: Diagnosis not present

## 2016-11-27 DIAGNOSIS — Z130 Encounter for screening for diseases of the blood and blood-forming organs and certain disorders involving the immune mechanism: Secondary | ICD-10-CM | POA: Diagnosis not present

## 2016-11-27 DIAGNOSIS — Z1231 Encounter for screening mammogram for malignant neoplasm of breast: Secondary | ICD-10-CM | POA: Diagnosis not present

## 2016-11-27 DIAGNOSIS — Z01419 Encounter for gynecological examination (general) (routine) without abnormal findings: Secondary | ICD-10-CM | POA: Diagnosis not present

## 2016-11-27 DIAGNOSIS — Z1389 Encounter for screening for other disorder: Secondary | ICD-10-CM | POA: Diagnosis not present

## 2016-11-27 LAB — HM PAP SMEAR: HM Pap smear: NORMAL

## 2016-11-27 LAB — HM MAMMOGRAPHY: HM Mammogram: NORMAL (ref 0–4)

## 2016-11-28 ENCOUNTER — Encounter: Payer: Self-pay | Admitting: Internal Medicine

## 2016-12-22 ENCOUNTER — Ambulatory Visit: Payer: BLUE CROSS/BLUE SHIELD | Admitting: Dietician

## 2016-12-26 DIAGNOSIS — M25522 Pain in left elbow: Secondary | ICD-10-CM | POA: Diagnosis not present

## 2017-01-19 ENCOUNTER — Other Ambulatory Visit: Payer: Self-pay | Admitting: *Deleted

## 2017-01-19 DIAGNOSIS — E039 Hypothyroidism, unspecified: Secondary | ICD-10-CM

## 2017-01-19 DIAGNOSIS — E7800 Pure hypercholesterolemia, unspecified: Secondary | ICD-10-CM

## 2017-01-19 DIAGNOSIS — I10 Essential (primary) hypertension: Secondary | ICD-10-CM

## 2017-01-31 ENCOUNTER — Other Ambulatory Visit (INDEPENDENT_AMBULATORY_CARE_PROVIDER_SITE_OTHER): Payer: BLUE CROSS/BLUE SHIELD

## 2017-01-31 DIAGNOSIS — I10 Essential (primary) hypertension: Secondary | ICD-10-CM | POA: Diagnosis not present

## 2017-01-31 DIAGNOSIS — E039 Hypothyroidism, unspecified: Secondary | ICD-10-CM | POA: Diagnosis not present

## 2017-01-31 DIAGNOSIS — E7800 Pure hypercholesterolemia, unspecified: Secondary | ICD-10-CM | POA: Diagnosis not present

## 2017-01-31 LAB — COMPREHENSIVE METABOLIC PANEL
ALT: 33 U/L (ref 0–35)
AST: 20 U/L (ref 0–37)
Albumin: 4.3 g/dL (ref 3.5–5.2)
Alkaline Phosphatase: 67 U/L (ref 39–117)
BUN: 17 mg/dL (ref 6–23)
CO2: 27 mEq/L (ref 19–32)
Calcium: 9.5 mg/dL (ref 8.4–10.5)
Chloride: 103 mEq/L (ref 96–112)
Creatinine, Ser: 0.73 mg/dL (ref 0.40–1.20)
GFR: 87.94 mL/min (ref >60.00)
Glucose, Bld: 109 mg/dL — ABNORMAL HIGH (ref 70–99)
Potassium: 3.6 mEq/L (ref 3.5–5.1)
Sodium: 139 mEq/L (ref 135–145)
Total Bilirubin: 0.5 mg/dL (ref 0.2–1.2)
Total Protein: 7.1 g/dL (ref 6.0–8.3)

## 2017-01-31 LAB — LIPID PANEL
Cholesterol: 174 mg/dL (ref 0–200)
HDL: 31.4 mg/dL — ABNORMAL LOW (ref >39.00)
LDL Cholesterol: 115 mg/dL — ABNORMAL HIGH (ref 0–99)
NonHDL: 142.73
Total CHOL/HDL Ratio: 6
Triglycerides: 139 mg/dL (ref 0.0–149.0)
VLDL: 27.8 mg/dL (ref 0.0–40.0)

## 2017-01-31 LAB — T4, FREE: Free T4: 1.83 ng/dL — ABNORMAL HIGH (ref 0.60–1.60)

## 2017-01-31 LAB — TSH: TSH: 0.05 u[IU]/mL — ABNORMAL LOW (ref 0.35–4.50)

## 2017-02-01 ENCOUNTER — Encounter: Payer: Self-pay | Admitting: Internal Medicine

## 2017-02-01 MED ORDER — LEVOTHYROXINE SODIUM 150 MCG PO TABS: 150.0000 ug | ORAL_TABLET | Freq: Every day | ORAL | 1 refills | Status: DC

## 2017-02-01 NOTE — Addendum Note (Signed)
Addended by: Lurlean Nanny on: 02/01/2017 02:22 PM   Modules accepted: Orders

## 2017-02-14 ENCOUNTER — Ambulatory Visit (INDEPENDENT_AMBULATORY_CARE_PROVIDER_SITE_OTHER): Payer: BLUE CROSS/BLUE SHIELD | Admitting: Internal Medicine

## 2017-02-14 ENCOUNTER — Encounter: Payer: Self-pay | Admitting: Internal Medicine

## 2017-02-14 ENCOUNTER — Telehealth: Payer: Self-pay | Admitting: Internal Medicine

## 2017-02-14 VITALS — BP 122/74 | HR 75 | Temp 98.1°F | Wt 176.5 lb

## 2017-02-14 DIAGNOSIS — N809 Endometriosis, unspecified: Secondary | ICD-10-CM | POA: Diagnosis not present

## 2017-02-14 DIAGNOSIS — R102 Pelvic and perineal pain: Secondary | ICD-10-CM | POA: Diagnosis not present

## 2017-02-14 DIAGNOSIS — Z8041 Family history of malignant neoplasm of ovary: Secondary | ICD-10-CM

## 2017-02-14 LAB — POC URINALSYSI DIPSTICK (AUTOMATED)
Bilirubin, UA: NEGATIVE
Blood, UA: NEGATIVE
Glucose, UA: NEGATIVE
Ketones, UA: NEGATIVE
Leukocytes, UA: NEGATIVE
Nitrite, UA: NEGATIVE
Protein, UA: NEGATIVE
Spec Grav, UA: 1.02 (ref 1.010–1.025)
Urobilinogen, UA: 0.2 E.U./dL
pH, UA: 8 (ref 5.0–8.0)

## 2017-02-14 LAB — POCT URINE PREGNANCY: Preg Test, Ur: NEGATIVE

## 2017-02-14 NOTE — Progress Notes (Signed)
Subjective:    Patient ID: Mary Schaefer, female    DOB: Apr 02, 1961, 56 y.o.   MRN: [id number]  HPI  Pt presents to the clinic today with c/o left lower pelvic pain. She reports this started 2-3 days ago. She describes the pain as dull and achy. The pain radiates to her left lower back. The pain can be worse with walking or laying down, but is not necessarily worse with movement. She started having some urinary frequency and mild dysuria this morning. She denies vaginal complaints or abnormal bleeding. She is perimenopausal, LMP was 11/2016. She saw her GYN at that time, pap smear was normal. She had a pelvic ultrasound which showed cyst on the left ovary and multiple cyst on the cervix. She reports her mother had a history of ovarian cancer. She does have endometriosis. She has been mildly constipated, but had a large BM yesterday after laxatives, and that did not make the pain go away. She has taken Aleve with minimal relief.  Review of Systems      Past Medical History:  Diagnosis Date  . Blood in stool   . Childhood asthma   . Endometriosis 1997  . Hypertension   . Thyroid disease     Current Outpatient Prescriptions  Medication Sig Dispense Refill  . BEPREVE 1.5 % SOLN PLACE 1 DROP INTO BOTH EYES TWICE DAILY AS NEEDED FOR ALLERGIES  3  . hydrOXYzine (ATARAX/VISTARIL) 25 MG tablet TAKE 1 TABLET BY MOUTH THREE TIMES A DAY AS NEEDED    . levothyroxine (SYNTHROID) 150 MCG tablet Take 1 tablet (150 mcg total) by mouth daily before breakfast. 30 tablet 1  . Multiple Vitamin (MULTIVITAMIN) tablet Take 1 tablet by mouth daily.    . NONFORMULARY OR COMPOUNDED ITEM Apply 1 application topically 3 (three) times daily. Diltiazem 2% ointment    . ondansetron (ZOFRAN-ODT) 8 MG disintegrating tablet Take 1 tablet (8 mg total) by mouth every 8 (eight) hours as needed for nausea. 15 tablet 0  . Sennosides-Docusate Sodium (STOOL SOFTENER LAXATIVE PO) Take 1 tablet by mouth daily as needed.    .  simvastatin (ZOCOR) 20 MG tablet Take 1 tablet (20 mg total) by mouth at bedtime. 90 tablet 0   No current facility-administered medications for this visit.     Allergies  Allergen Reactions  . Aspirin Shortness Of Breath and Hives  . Penicillins Hives    Family History  Problem Relation Age of Onset  . Ovarian cancer Mother   . Hyperlipidemia Mother   . Alcohol abuse Father   . Hyperlipidemia Father   . Heart disease Father   . Stroke Father   . Hypertension Father     Social History   Social History  . Marital status: Unknown    Spouse name: N/A  . Number of children: N/A  . Years of education: N/A   Occupational History  . Not on file.   Social History Main Topics  . Smoking status: Never Smoker  . Smokeless tobacco: Never Used  . Alcohol use Yes     Comment: occasional  . Drug use: Yes  . Sexual activity: Yes   Other Topics Concern  . Not on file   Social History Narrative  . No narrative on file     Constitutional: Denies fever, malaise, fatigue, headache or abrupt weight changes.  Gastrointestinal: Pt reports mild constipation. Denies abdominal pain, bloating, constipation, diarrhea or blood in the stool.  GU: Pt reports pelvic pain, frequency  and dysuria. Denies urgency, burning sensation, blood in urine, odor or discharge.  No other specific complaints in a complete review of systems (except as listed in HPI above).   Objective:   Physical Exam  BP 122/74   Pulse 75   Temp 98.1 F (36.7 C) (Oral)   Wt 176 lb 8 oz (80.1 kg)   SpO2 98%   BMI 28.49 kg/m  Wt Readings from Last 3 Encounters:  02/14/17 176 lb 8 oz (80.1 kg)  11/24/16 183 lb (83 kg)  11/24/16 183 lb 3.2 oz (83.1 kg)    General: Appears her stated age, in NAD. Skin: No rashes noted. Abdomen: Soft and mildly tender in the LLQ. Normal bowel sounds. No distention or masses noted. No CVA tenderness noted. Musculoskeletal: No pain with palpation or ROM of the lumbar spine or left  hip. No difficulty with gait.   BMET    Component Value Date/Time   NA 139 01/31/2017 0757   K 3.6 01/31/2017 0757   CL 103 01/31/2017 0757   CO2 27 01/31/2017 0757   GLUCOSE 109 (H) 01/31/2017 0757   BUN 17 01/31/2017 0757   CREATININE 0.73 01/31/2017 0757   CREATININE 0.87 03/31/2016 1606   CALCIUM 9.5 01/31/2017 0757    Lipid Panel     Component Value Date/Time   CHOL 174 01/31/2017 0757   TRIG 139.0 01/31/2017 0757   HDL 31.40 (L) 01/31/2017 0757   CHOLHDL 6 01/31/2017 0757   VLDL 27.8 01/31/2017 0757   LDLCALC 115 (H) 01/31/2017 0757    CBC No results found for: WBC, RBC, HGB, HCT, PLT, MCV, MCH, MCHC, RDW, LYMPHSABS, MONOABS, EOSABS, BASOSABS  Hgb A1C Lab Results  Component Value Date   HGBA1C 5.8 (H) 03/31/2016            Assessment & Plan:   LLQ Pelvic Pain:  Will obtain repeat pelvic/ transvaginal ultrasound Heat may help with your pain Try Ibuprofen instead of Aleve  Will follow up after ultrasound, return precautions discussed Webb Silversmith, NP

## 2017-02-14 NOTE — Telephone Encounter (Signed)
Patient Name: Mary Schaefer  DOB: 1960/12/10    Initial Comment Caller states she's having pain in her left side going into her back, has been going on for a few days.   Nurse Assessment  Nurse: Raphael Gibney, RN, Vanita Ingles Date/Time (Eastern Time): 02/14/2017 11:18:58 AM  Confirm and document reason for call. If symptomatic, describe symptoms. ---Caller states she has been having on her left side on her lower abd and it radiates to her back. Has had pain for a few days. Notices pain more when she gets up and moves around. Pain level 6. Has pain all morning.  Does the patient have any new or worsening symptoms? ---Yes  Will a triage be completed? ---Yes  Related visit to physician within the last 2 weeks? ---No  Does the PT have any chronic conditions? (i.e. diabetes, asthma, etc.) ---Yes  List chronic conditions. ---thyroid  Is this a behavioral health or substance abuse call? ---No     Guidelines    Guideline Title Affirmed Question Affirmed Notes  Abdominal Pain - Female [1] MILD-MODERATE pain AND [2] constant AND [3] present > 2 hours    Final Disposition User   See Physician within 4 Hours (or PCP triage) Raphael Gibney, RN, [place]    Comments  no appts available within 4 hrs. pt does not want to go to another office or go to urgent care Please call pt back regarding appt   Referrals  [facility] REFUSED   Disagree/Comply: Comply

## 2017-02-14 NOTE — Patient Instructions (Signed)
Pelvic Pain, Female °Pelvic pain is pain in your lower belly (abdomen), below your belly button and between your hips. The pain may start suddenly (acute), keep coming back (recurring), or last a long time (chronic). Pelvic pain that lasts longer than six months is considered chronic. There are many causes of pelvic pain. Sometimes the cause of your pelvic pain is not known. °Follow these instructions at home: °· Take over-the-counter and prescription medicines only as told by your doctor. °· Rest as told by your doctor. °· Do not have sex it if hurts. °· Keep a journal of your pelvic pain. Write down: °? When the pain started. °? Where the pain is located. °? What seems to make the pain better or worse, such as food or your menstrual cycle. °? Any symptoms you have along with the pain. °· Keep all follow-up visits as told by your doctor. This is important. °Contact a doctor if: °· Medicine does not help your pain. °· Your pain comes back. °· You have new symptoms. °· You have unusual vaginal discharge or bleeding. °· You have a fever or chills. °· You are having a hard time pooping (constipation). °· You have blood in your pee (urine) or poop (stool). °· Your pee smells bad. °· You feel weak or lightheaded. °Get help right away if: °· You have sudden pain that is very bad. °· Your pain continues to get worse. °· You have very bad pain and also have any of the following symptoms: °? A fever. °? Feeling stick to your stomach (nausea). °? Throwing up (vomiting). °? Being very sweaty. °· You pass out (lose consciousness). °This information is not intended to replace advice given to you by your health care provider. Make sure you discuss any questions you have with your health care provider. °Document Released: 11/15/2007 Document Revised: 06/23/2015 Document Reviewed: 03/19/2015 °Elsevier Interactive Patient Education © 2018 [employer] ° °

## 2017-02-14 NOTE — Telephone Encounter (Addendum)
I spoke with pt and she has had couple of days of lower abd pain; now has frequency and burning upon urination. No fever or hx of kidney stones. Pt does  Not want to go to UC or ED. Pt scheduled 30' appt with Avie Echevaria NP on 02/14/17 at 3:15 . If pt condition changes or worsens prior to appt pt will go to ED.

## 2017-02-14 NOTE — Addendum Note (Signed)
Addended by: Lurlean Nanny on: 02/14/2017 06:43 PM   Modules accepted: Orders

## 2017-02-20 ENCOUNTER — Other Ambulatory Visit: Payer: Self-pay | Admitting: Internal Medicine

## 2017-02-22 ENCOUNTER — Other Ambulatory Visit: Payer: BLUE CROSS/BLUE SHIELD

## 2017-02-23 ENCOUNTER — Other Ambulatory Visit: Payer: Self-pay | Admitting: Internal Medicine

## 2017-02-28 ENCOUNTER — Encounter: Payer: Self-pay | Admitting: Internal Medicine

## 2017-02-28 ENCOUNTER — Other Ambulatory Visit (INDEPENDENT_AMBULATORY_CARE_PROVIDER_SITE_OTHER): Payer: BLUE CROSS/BLUE SHIELD

## 2017-02-28 DIAGNOSIS — E039 Hypothyroidism, unspecified: Secondary | ICD-10-CM

## 2017-02-28 LAB — TSH: TSH: 0.53 u[IU]/mL (ref 0.35–4.50)

## 2017-02-28 LAB — T4, FREE: Free T4: 1.09 ng/dL (ref 0.60–1.60)

## 2017-03-01 MED ORDER — LEVOTHYROXINE SODIUM 150 MCG PO TABS: 150.0000 ug | ORAL_TABLET | Freq: Every day | ORAL | 4 refills | Status: DC

## 2017-03-02 ENCOUNTER — Ambulatory Visit
Admission: RE | Admit: 2017-03-02 | Discharge: 2017-03-02 | Disposition: A | Discharge status: Home / Self Care | Payer: BLUE CROSS/BLUE SHIELD | Source: Ambulatory Visit | Attending: Internal Medicine | Admitting: Internal Medicine

## 2017-03-02 DIAGNOSIS — R102 Pelvic and perineal pain: Secondary | ICD-10-CM

## 2017-03-02 DIAGNOSIS — D259 Leiomyoma of uterus, unspecified: Secondary | ICD-10-CM | POA: Diagnosis not present

## 2017-03-26 DIAGNOSIS — Z23 Encounter for immunization: Secondary | ICD-10-CM | POA: Diagnosis not present

## 2017-04-06 ENCOUNTER — Other Ambulatory Visit: Payer: Self-pay | Admitting: Internal Medicine

## 2017-04-06 DIAGNOSIS — I10 Essential (primary) hypertension: Secondary | ICD-10-CM

## 2017-06-15 DIAGNOSIS — M7541 Impingement syndrome of right shoulder: Secondary | ICD-10-CM | POA: Diagnosis not present

## 2017-07-07 ENCOUNTER — Other Ambulatory Visit: Payer: Self-pay | Admitting: Internal Medicine

## 2017-07-07 DIAGNOSIS — I10 Essential (primary) hypertension: Secondary | ICD-10-CM

## 2017-08-07 DIAGNOSIS — M7541 Impingement syndrome of right shoulder: Secondary | ICD-10-CM | POA: Diagnosis not present

## 2017-08-07 DIAGNOSIS — M5412 Radiculopathy, cervical region: Secondary | ICD-10-CM | POA: Diagnosis not present

## 2017-08-10 ENCOUNTER — Other Ambulatory Visit: Payer: Self-pay

## 2017-08-10 MED ORDER — LEVOTHYROXINE SODIUM 150 MCG PO TABS: 150.0000 ug | ORAL_TABLET | Freq: Every day | ORAL | 0 refills | Status: DC

## 2017-08-30 ENCOUNTER — Telehealth: Payer: Self-pay | Admitting: Internal Medicine

## 2017-08-30 NOTE — Telephone Encounter (Signed)
Only thing available next week is Thurs at 3:45... Left message on voicemail for pt to return my call

## 2017-08-30 NOTE — Telephone Encounter (Signed)
See below crm  Can pt be worked in for cpx if so when?   Copied from [place]. Topic: Inquiry >> Aug 30, 2017  1:29 PM Pricilla Handler wrote: Reason for CRM: Patient called wanting a physical with Webb Silversmith next week. Patient states that she must have a physical completed by Friday of next week for her job. It must be completed before September 10, 2017. Patient wants to know if Rollene Fare would work her into her schedule next week for a physical? Patient would like a call back at [phone].          Thank You!!!

## 2017-08-31 NOTE — Telephone Encounter (Signed)
Pt called back and I scheduled her on Thursday, 3/28 at 3:45. Thank you

## 2017-09-06 ENCOUNTER — Encounter: Payer: Self-pay | Admitting: Internal Medicine

## 2017-09-06 ENCOUNTER — Ambulatory Visit (INDEPENDENT_AMBULATORY_CARE_PROVIDER_SITE_OTHER): Payer: BLUE CROSS/BLUE SHIELD | Admitting: Internal Medicine

## 2017-09-06 VITALS — BP 120/76 | HR 92 | Temp 98.4°F | Ht 66.0 in | Wt 189.0 lb

## 2017-09-06 DIAGNOSIS — Z0000 Encounter for general adult medical examination without abnormal findings: Secondary | ICD-10-CM | POA: Diagnosis not present

## 2017-09-06 DIAGNOSIS — E785 Hyperlipidemia, unspecified: Secondary | ICD-10-CM | POA: Insufficient documentation

## 2017-09-06 DIAGNOSIS — E7800 Pure hypercholesterolemia, unspecified: Secondary | ICD-10-CM | POA: Diagnosis not present

## 2017-09-06 DIAGNOSIS — F411 Generalized anxiety disorder: Secondary | ICD-10-CM | POA: Diagnosis not present

## 2017-09-06 DIAGNOSIS — E039 Hypothyroidism, unspecified: Secondary | ICD-10-CM

## 2017-09-06 DIAGNOSIS — I10 Essential (primary) hypertension: Secondary | ICD-10-CM | POA: Diagnosis not present

## 2017-09-06 DIAGNOSIS — H6123 Impacted cerumen, bilateral: Secondary | ICD-10-CM | POA: Diagnosis not present

## 2017-09-06 DIAGNOSIS — E1169 Type 2 diabetes mellitus with other specified complication: Secondary | ICD-10-CM | POA: Insufficient documentation

## 2017-09-06 MED ORDER — HYDROXYZINE HCL 25 MG PO TABS: 25.0000 mg | ORAL_TABLET | Freq: Three times a day (TID) | ORAL | 2 refills | Status: DC | PRN

## 2017-09-06 NOTE — Assessment & Plan Note (Signed)
Controlled on Lisinopril HCT Reinforced DASH diet and increase aerobic exercise CBC and CMET today

## 2017-09-06 NOTE — Patient Instructions (Signed)
Earwax Buildup, Adult The ears produce a substance called earwax that helps keep bacteria out of the ear and protects the skin in the ear canal. Occasionally, earwax can build up in the ear and cause discomfort or hearing loss. What increases the risk? This condition is more likely to develop in people who:  Are female.  Are elderly.  Naturally produce more earwax.  Clean their ears often with cotton swabs.  Use earplugs often.  Use in-ear headphones often.  Wear hearing aids.  Have narrow ear canals.  Have earwax that is overly thick or sticky.  Have eczema.  Are dehydrated.  Have excess hair in the ear canal.  What are the signs or symptoms? Symptoms of this condition include:  Reduced or muffled hearing.  A feeling of fullness in the ear or feeling that the ear is plugged.  Fluid coming from the ear.  Ear pain.  Ear itch.  Ringing in the ear.  Coughing.  An obvious piece of earwax that can be seen inside the ear canal.  How is this diagnosed? This condition may be diagnosed based on:  Your symptoms.  Your medical history.  An ear exam. During the exam, your health care provider will look into your ear with an instrument called an otoscope.  You may have tests, including a hearing test. How is this treated? This condition may be treated by:  Using ear drops to soften the earwax.  Having the earwax removed by a health care provider. The health care provider may: ? Flush the ear with water. ? Use an instrument that has a loop on the end (curette). ? Use a suction device.  Surgery to remove the wax buildup. This may be done in severe cases.  Follow these instructions at home:  Take over-the-counter and prescription medicines only as told by your health care provider.  Do not put any objects, including cotton swabs, into your ear. You can clean the opening of your ear canal with a washcloth or facial tissue.  Follow instructions from your health  care provider about cleaning your ears. Do not over-clean your ears.  Drink enough fluid to keep your urine clear or pale yellow. This will help to thin the earwax.  Keep all follow-up visits as told by your health care provider. If earwax builds up in your ears often or if you use hearing aids, consider seeing your health care provider for routine, preventive ear cleanings. Ask your health care provider how often you should schedule your cleanings.  If you have hearing aids, clean them according to instructions from the manufacturer and your health care provider. Contact a health care provider if:  You have ear pain.  You develop a fever.  You have blood, pus, or other fluid coming from your ear.  You have hearing loss.  You have ringing in your ears that does not go away.  Your symptoms do not improve with treatment.  You feel like the room is spinning (vertigo). Summary  Earwax can build up in the ear and cause discomfort or hearing loss.  The most common symptoms of this condition include reduced or muffled hearing and a feeling of fullness in the ear or feeling that the ear is plugged.  This condition may be diagnosed based on your symptoms, your medical history, and an ear exam.  This condition may be treated by using ear drops to soften the earwax or by having the earwax removed by a health care provider.  Do   not put any objects, including cotton swabs, into your ear. You can clean the opening of your ear canal with a washcloth or facial tissue. This information is not intended to replace advice given to you by your health care provider. Make sure you discuss any questions you have with your health care provider. Document Released: 07/06/2004 Document Revised: 08/09/2016 Document Reviewed: 08/09/2016 Elsevier Interactive Patient Education  2018 [employer]  

## 2017-09-06 NOTE — Assessment & Plan Note (Signed)
CMET and lipid profile today Encouraged her to consume a low fat diet Continue Simvastatin for now, will adjust if needed based on labs 

## 2017-09-06 NOTE — Assessment & Plan Note (Signed)
Controlled on prn Hydroxyzine, refilled today Support offered today

## 2017-09-06 NOTE — Assessment & Plan Note (Signed)
TSH and Free T4 today Will adjust Synthroid if needed based on labs 

## 2017-09-06 NOTE — Progress Notes (Signed)
Subjective:    Patient ID: Mary Schaefer, female    DOB: Jul 11, 1960, 57 y.o.   MRN: [id number]  HPI  Pt presents to the clinic today for her annual exam. She is also due to follow up chronic conditions.  HTN: Her BP today is 120/76. She is taking Lisinopril HCT as prescribed. There is no ECG on file.   Hypothyroidism: Her levels were last checked 02/2017. She denies any issues on her current dose of Synthroid.  HLD: Her last LDL was 118, 01/2017. She denies myalgias on Simvastatin. She tries to consume a low fat diet.  GAD: Triggered by general stress. She takes Hydroxyzine as needed. She would like a refill of this today.  Flu: 03/2017 Tetanus: 01/2014 Pap Smear: 11/2016 Mammogram: 11/2016 Colon Screening: 04/2013 Vision Screening: annually Dentist: annually  Diet: She does eat meat. She consumes fruits and veggies daily. She tries to avoid fried foods. She drinks mostly water. Exercise: Walk for 30 minutes 2-3 times per week.  Review of Systems  Past Medical History:  Diagnosis Date  . Blood in stool   . Childhood asthma   . Endometriosis 1997  . Hypertension   . Thyroid disease     Current Outpatient Medications  Medication Sig Dispense Refill  . BEPREVE 1.5 % SOLN PLACE 1 DROP INTO BOTH EYES TWICE DAILY AS NEEDED FOR ALLERGIES  3  . hydrOXYzine (ATARAX/VISTARIL) 25 MG tablet TAKE 1 TABLET BY MOUTH THREE TIMES A DAY AS NEEDED    . levothyroxine (SYNTHROID) 150 MCG tablet Take 1 tablet (150 mcg total) by mouth daily before breakfast. 90 tablet 0  . lisinopril-hydrochlorothiazide (PRINZIDE,ZESTORETIC) 10-12.5 MG tablet TAKE 1 TABLET BY MOUTH DAILY. 90 tablet 0  . Multiple Vitamin (MULTIVITAMIN) tablet Take 1 tablet by mouth daily.    . NONFORMULARY OR COMPOUNDED ITEM Apply 1 application topically 3 (three) times daily. Diltiazem 2% ointment    . ondansetron (ZOFRAN-ODT) 8 MG disintegrating tablet Take 1 tablet (8 mg total) by mouth every 8 (eight) hours as needed for  nausea. 15 tablet 0  . Sennosides-Docusate Sodium (STOOL SOFTENER LAXATIVE PO) Take 1 tablet by mouth daily as needed.    . simvastatin (ZOCOR) 20 MG tablet TAKE 1 TABLET (20 MG TOTAL) BY MOUTH AT BEDTIME. 90 tablet 1   No current facility-administered medications for this visit.     Allergies  Allergen Reactions  . Aspirin Shortness Of Breath and Hives  . Penicillins Hives    Family History  Problem Relation Age of Onset  . Ovarian cancer Mother   . Hyperlipidemia Mother   . Alcohol abuse Father   . Hyperlipidemia Father   . Heart disease Father   . Stroke Father   . Hypertension Father     Social History   Socioeconomic History  . Marital status: Unknown    Spouse name: Not on file  . Number of children: Not on file  . Years of education: Not on file  . Highest education level: Not on file  Occupational History  . Not on file  Social Needs  . Financial resource strain: Not on file  . Food insecurity:    Worry: Not on file    Inability: Not on file  . Transportation needs:    Medical: Not on file    Non-medical: Not on file  Tobacco Use  . Smoking status: Never Smoker  . Smokeless tobacco: Never Used  Substance and Sexual Activity  . Alcohol use: Yes  Comment: occasional  . Drug use: Yes  . Sexual activity: Yes  Lifestyle  . Physical activity:    Days per week: Not on file    Minutes per session: Not on file  . Stress: Not on file  Relationships  . Social connections:    Talks on phone: Not on file    Gets together: Not on file    Attends religious service: Not on file    Active member of club or organization: Not on file    Attends meetings of clubs or organizations: Not on file    Relationship status: Not on file  . Intimate partner violence:    Fear of current or ex partner: Not on file    Emotionally abused: Not on file    Physically abused: Not on file    Forced sexual activity: Not on file  Other Topics Concern  . Not on file  Social  History Narrative  . Not on file     Constitutional: Denies fever, malaise, fatigue, headache or abrupt weight changes.  HEENT: Denies eye pain, eye redness, ear pain, ringing in the ears, wax buildup, runny nose, nasal congestion, bloody nose, or sore throat. Respiratory: Denies difficulty breathing, shortness of breath, cough or sputum production.   Cardiovascular: Denies chest pain, chest tightness, palpitations or swelling in the hands or feet.  Gastrointestinal: Pt reports intermittent reflux. Denies abdominal pain, bloating, constipation, diarrhea or blood in the stool.  GU: Denies urgency, frequency, pain with urination, burning sensation, blood in urine, odor or discharge. Musculoskeletal: Denies decrease in range of motion, difficulty with gait, muscle pain or joint pain and swelling.  Skin: Denies redness, rashes, lesions or ulcercations.  Neurological: Denies dizziness, difficulty with memory, difficulty with speech or problems with balance and coordination.  Psych: Pt reports anxiety. Denies depression, SI/HI.  No other specific complaints in a complete review of systems (except as listed in HPI above).     Objective:   Physical Exam  BP 120/76   Pulse 92   Temp 98.4 F (36.9 C) (Oral)   Ht 5\' 6"  (1.676 m)   Wt 189 lb (85.7 kg)   LMP 09/10/2016   SpO2 98%   BMI 30.51 kg/m  Wt Readings from Last 3 Encounters:  09/06/17 189 lb (85.7 kg)  02/14/17 176 lb 8 oz (80.1 kg)  11/24/16 183 lb (83 kg)    General: Appears therstated age, obese in NAD. Skin: Warm, dry and intact.  HEENT: Head: normal shape and size; Eyes: sclera white, no icterus, conjunctiva pink, PERRLA and EOMs intact; Ears:bilateral cerumen impaction; Throat/Mouth: Teeth present, mucosa pink and moist, no exudate, lesions or ulcerations noted.  Neck:  Neck supple, trachea midline. No masses, lumps or thyromegaly present.  Cardiovascular: Normal rate and rhythm. S1,S2 noted.  No murmur, rubs or gallops  noted. No JVD or BLE edema. No carotid bruits noted. Pulmonary/Chest: Normal effort and positive vesicular breath sounds. No respiratory distress. No wheezes, rales or ronchi noted.  Abdomen: Soft and nontender. Normal bowel sounds. No distention or masses noted. Liver, spleen and kidneys non palpable. Musculoskeletal: Strength 5/5 BUE/BLE. No difficulty with gait.  Neurological: Alert and oriented. Cranial nerves II-XII grossly intact. Coordination normal.  Psychiatric: Mood and affect normal. Behavior is normal. Judgment and thought content normal.     BMET    Component Value Date/Time   NA 139 01/31/2017 0757   K 3.6 01/31/2017 0757   CL 103 01/31/2017 0757   CO2 27 01/31/2017  0757   GLUCOSE 109 (H) 01/31/2017 0757   BUN 17 01/31/2017 0757   CREATININE 0.73 01/31/2017 0757   CREATININE 0.87 03/31/2016 1606   CALCIUM 9.5 01/31/2017 0757    Lipid Panel     Component Value Date/Time   CHOL 174 01/31/2017 0757   TRIG 139.0 01/31/2017 0757   HDL 31.40 (L) 01/31/2017 0757   CHOLHDL 6 01/31/2017 0757   VLDL 27.8 01/31/2017 0757   LDLCALC 115 (H) 01/31/2017 0757    CBC No results found for: WBC, RBC, HGB, HCT, PLT, MCV, MCH, MCHC, RDW, LYMPHSABS, MONOABS, EOSABS, BASOSABS  Hgb A1C Lab Results  Component Value Date   HGBA1C 5.8 (H) 03/31/2016            Assessment & Plan:   Preventative Health Maintenance:  Flu and tetanus UTD Pap smear and mammogram UTD Colon screening UTD Encouraged her to consume a balanced diet and exercise regimen  Advised her to see an eye doctor and dentist annually Will check CBC, CMET, Lipid, TSH, Free T4 and Vit D today  Bilateral Cerumen Impaction:  Manual lavage by CMA Advised her to try Debrox OTC to prevent wax buildup  RTC in 1 year, sooner if needed Webb Silversmith, NP

## 2017-09-07 ENCOUNTER — Other Ambulatory Visit (INDEPENDENT_AMBULATORY_CARE_PROVIDER_SITE_OTHER): Payer: BLUE CROSS/BLUE SHIELD

## 2017-09-07 ENCOUNTER — Other Ambulatory Visit: Payer: Self-pay | Admitting: Primary Care

## 2017-09-07 DIAGNOSIS — R7309 Other abnormal glucose: Secondary | ICD-10-CM

## 2017-09-07 LAB — COMPREHENSIVE METABOLIC PANEL
ALT: 43 U/L — ABNORMAL HIGH (ref 0–35)
AST: 25 U/L (ref 0–37)
Albumin: 4.5 g/dL (ref 3.5–5.2)
Alkaline Phosphatase: 71 U/L (ref 39–117)
BUN: 17 mg/dL (ref 6–23)
CO2: 30 mEq/L (ref 19–32)
Calcium: 9.7 mg/dL (ref 8.4–10.5)
Chloride: 96 mEq/L (ref 96–112)
Creatinine, Ser: 0.71 mg/dL (ref 0.40–1.20)
GFR: 90.6 mL/min (ref >60.00)
Glucose, Bld: 201 mg/dL — ABNORMAL HIGH (ref 70–99)
Potassium: 3.5 mEq/L (ref 3.5–5.1)
Sodium: 136 mEq/L (ref 135–145)
Total Bilirubin: 0.4 mg/dL (ref 0.2–1.2)
Total Protein: 7.4 g/dL (ref 6.0–8.3)

## 2017-09-07 LAB — CBC
HCT: 43.7 % (ref 36.0–46.0)
Hemoglobin: 14.8 g/dL (ref 12.0–15.0)
MCHC: 33.8 g/dL (ref 30.0–36.0)
MCV: 90.2 fl (ref 78.0–100.0)
Platelets: 300 10*3/uL (ref 150.0–400.0)
RBC: 4.85 Mil/uL (ref 3.87–5.11)
RDW: 13.4 % (ref 11.5–15.5)
WBC: 7 10*3/uL (ref 4.0–10.5)

## 2017-09-07 LAB — LIPID PANEL
Cholesterol: 224 mg/dL — ABNORMAL HIGH (ref 0–200)
HDL: 38.3 mg/dL — ABNORMAL LOW (ref >39.00)
LDL Cholesterol: 154 mg/dL — ABNORMAL HIGH (ref 0–99)
NonHDL: 185.93
Total CHOL/HDL Ratio: 6
Triglycerides: 161 mg/dL — ABNORMAL HIGH (ref 0.0–149.0)
VLDL: 32.2 mg/dL (ref 0.0–40.0)

## 2017-09-07 LAB — VITAMIN D 25 HYDROXY (VIT D DEFICIENCY, FRACTURES): VITD: 32.21 ng/mL (ref 30.00–100.00)

## 2017-09-07 LAB — TSH: TSH: 1.84 u[IU]/mL (ref 0.35–4.50)

## 2017-09-07 LAB — HEMOGLOBIN A1C: Hgb A1c MFr Bld: 5.1 % (ref 4.6–6.5)

## 2017-09-07 LAB — T4, FREE: Free T4: 1.36 ng/dL (ref 0.60–1.60)

## 2017-09-11 NOTE — Addendum Note (Signed)
Addended by: Lurlean Nanny on: 09/11/2017 04:51 PM   Modules accepted: Orders

## 2017-09-14 MED ORDER — SIMVASTATIN 40 MG PO TABS: 40.0000 mg | ORAL_TABLET | Freq: Every day | ORAL | 0 refills | Status: DC

## 2017-09-14 NOTE — Addendum Note (Signed)
Addended by: Lurlean Nanny on: 09/14/2017 11:30 AM   Modules accepted: Orders

## 2017-09-17 ENCOUNTER — Emergency Department (HOSPITAL_COMMUNITY)
Admission: EM | Admit: 2017-09-17 | Discharge: 2017-09-17 | Disposition: A | Discharge status: Home / Self Care | Payer: BLUE CROSS/BLUE SHIELD | Attending: Emergency Medicine | Admitting: Emergency Medicine

## 2017-09-17 ENCOUNTER — Emergency Department (HOSPITAL_COMMUNITY): Payer: BLUE CROSS/BLUE SHIELD

## 2017-09-17 ENCOUNTER — Other Ambulatory Visit: Payer: Self-pay

## 2017-09-17 DIAGNOSIS — R1030 Lower abdominal pain, unspecified: Secondary | ICD-10-CM | POA: Diagnosis not present

## 2017-09-17 DIAGNOSIS — I10 Essential (primary) hypertension: Secondary | ICD-10-CM | POA: Insufficient documentation

## 2017-09-17 DIAGNOSIS — R1084 Generalized abdominal pain: Secondary | ICD-10-CM | POA: Diagnosis not present

## 2017-09-17 DIAGNOSIS — M545 Low back pain: Secondary | ICD-10-CM | POA: Diagnosis not present

## 2017-09-17 DIAGNOSIS — R109 Unspecified abdominal pain: Secondary | ICD-10-CM | POA: Diagnosis not present

## 2017-09-17 DIAGNOSIS — R945 Abnormal results of liver function studies: Secondary | ICD-10-CM | POA: Diagnosis not present

## 2017-09-17 DIAGNOSIS — R7989 Other specified abnormal findings of blood chemistry: Secondary | ICD-10-CM | POA: Insufficient documentation

## 2017-09-17 DIAGNOSIS — R110 Nausea: Secondary | ICD-10-CM | POA: Diagnosis not present

## 2017-09-17 DIAGNOSIS — Z79899 Other long term (current) drug therapy: Secondary | ICD-10-CM | POA: Insufficient documentation

## 2017-09-17 LAB — CBC
HCT: 44 % (ref 36.0–46.0)
Hemoglobin: 14.4 g/dL (ref 12.0–15.0)
MCH: 29.7 pg (ref 26.0–34.0)
MCHC: 32.7 g/dL (ref 30.0–36.0)
MCV: 90.7 fL (ref 78.0–100.0)
Platelets: 225 10*3/uL (ref 150–400)
RBC: 4.85 MIL/uL (ref 3.87–5.11)
RDW: 13.1 % (ref 11.5–15.5)
WBC: 6 10*3/uL (ref 4.0–10.5)

## 2017-09-17 LAB — URINALYSIS, ROUTINE W REFLEX MICROSCOPIC
Bilirubin Urine: NEGATIVE
Glucose, UA: 50 mg/dL — AB
Hgb urine dipstick: NEGATIVE
Ketones, ur: NEGATIVE mg/dL
Leukocytes, UA: NEGATIVE
Nitrite: NEGATIVE
Protein, ur: NEGATIVE mg/dL
Specific Gravity, Urine: 1.006 (ref 1.005–1.030)
pH: 6 (ref 5.0–8.0)

## 2017-09-17 LAB — COMPREHENSIVE METABOLIC PANEL
ALT: 94 U/L — ABNORMAL HIGH (ref 14–54)
AST: 44 U/L — ABNORMAL HIGH (ref 15–41)
Albumin: 4 g/dL (ref 3.5–5.0)
Alkaline Phosphatase: 71 U/L (ref 38–126)
Anion gap: 12 (ref 5–15)
BUN: 19 mg/dL (ref 6–20)
CO2: 24 mmol/L (ref 22–32)
Calcium: 9.2 mg/dL (ref 8.9–10.3)
Chloride: 101 mmol/L (ref 101–111)
Creatinine, Ser: 0.74 mg/dL (ref 0.44–1.00)
GFR calc Af Amer: >60 mL/min (ref >60)
GFR calc non Af Amer: >60 mL/min (ref >60)
Glucose, Bld: 170 mg/dL — ABNORMAL HIGH (ref 65–99)
Potassium: 4.1 mmol/L (ref 3.5–5.1)
Sodium: 137 mmol/L (ref 135–145)
Total Bilirubin: 0.4 mg/dL (ref 0.3–1.2)
Total Protein: 6.6 g/dL (ref 6.5–8.1)

## 2017-09-17 LAB — LIPASE, BLOOD: Lipase: 28 U/L (ref 11–51)

## 2017-09-17 LAB — WET PREP, GENITAL
Clue Cells Wet Prep HPF POC: NONE SEEN
Sperm: NONE SEEN
Trich, Wet Prep: NONE SEEN
Yeast Wet Prep HPF POC: NONE SEEN

## 2017-09-17 LAB — I-STAT BETA HCG BLOOD, ED (MC, WL, AP ONLY): I-stat hCG, quantitative: <5 m[IU]/mL (ref <5)

## 2017-09-17 MED ORDER — IBUPROFEN 800 MG PO TABS
800.0000 mg | ORAL_TABLET | Freq: Once | ORAL | Status: AC
  Administered 2017-09-17: 800 mg via ORAL
  Filled 2017-09-17: qty 1

## 2017-09-17 MED ORDER — ONDANSETRON 4 MG PO TBDP: 4.0000 mg | ORAL_TABLET | Freq: Once | ORAL | Status: DC | PRN

## 2017-09-17 NOTE — ED Notes (Signed)
D/c reviewed with teach back

## 2017-09-17 NOTE — ED Notes (Signed)
Patient had just urinated in restroom as soon as she returned from [place]. Specimen cup provided to patient for urine specimen.

## 2017-09-17 NOTE — ED Provider Notes (Signed)
[facility] DEPARTMENT Provider Note   CSN: [id number] Arrival date & time: 09/17/17  0636     History   Chief Complaint Chief Complaint  Patient presents with  . Abdominal Pain    HPI Mary Schaefer is a 57 y.o. female with history of endometriosis, hypertension, thyroid disease, generalized anxiety disorder presents today for evaluation of acute onset, progressively worsening abdominal pain for 2 weeks.  She states pain is cramping and essentially constant but will change locations.  It is primarily in her lower abdomen but will occasionally radiate to her low back and anterior thighs bilaterally.  Yesterday she experienced sharp stabbing pain in her left lower quadrant which resolved and today she is experiencing pain primarily to the right side of her lower back.  She denies any recent trauma or falls.  She does note associated nausea but no vomiting and generalized fatigue.  She denies chest pain, shortness of breath, fevers, chills, diarrhea, constipation, melena, hematochezia, or urinary symptoms.  She denies any vaginal itching, bleeding, or discharge but states "it feels like I am about to have by.  But I have not had one for almost a year".  No aggravating factors but pain is alleviated somewhat with Aleve.  She undergoes pelvic ultrasound yearly due to family history of mother having ovarian cancer, last ultrasound was April 2018.  The history is provided by the patient.    Past Medical History:  Diagnosis Date  . Blood in stool   . Childhood asthma   . Endometriosis 1997  . Hypertension   . Thyroid disease     Patient Active Problem List   Diagnosis Date Noted  . HLD (hyperlipidemia) 09/06/2017  . Essential hypertension 03/31/2016  . Acquired hypothyroidism 03/31/2016  . Endometriosis 03/31/2016  . Generalized anxiety disorder 03/31/2016    Past Surgical History:  Procedure Laterality Date  . TUBAL LIGATION  1998     OB History     None      Home Medications    Prior to Admission medications   Medication Sig Start Date End Date Taking? Authorizing Provider  acetaminophen (TYLENOL) 500 MG tablet Take 1,000 mg by mouth as needed for mild pain.   Yes [provider]  BEPREVE 1.5 % SOLN PLACE 1 DROP INTO BOTH EYES TWICE DAILY AS NEEDED FOR ALLERGIES 08/06/16  Yes [provider]  cyclobenzaprine (FLEXERIL) 10 MG tablet Take 10 mg by mouth 3 (three) times daily. 06/15/17  Yes [provider]  hydrOXYzine (ATARAX/VISTARIL) 25 MG tablet Take 1 tablet (25 mg total) by mouth 3 (three) times daily as needed. 09/06/17  Yes Jearld Fenton, NP  levothyroxine (SYNTHROID) 150 MCG tablet Take 1 tablet (150 mcg total) by mouth daily before breakfast. 08/10/17  Yes Baity, Coralie Keens, NP  lisinopril-hydrochlorothiazide (PRINZIDE,ZESTORETIC) 10-12.5 MG tablet TAKE 1 TABLET BY MOUTH DAILY. 07/10/17  Yes Baity, Coralie Keens, NP  naproxen sodium (ALEVE) 220 MG tablet Take 440 mg by mouth as needed (pain).   Yes [provider]  ranitidine (ZANTAC) 150 MG tablet Take 150 mg by mouth as needed for heartburn.   Yes [provider]  simvastatin (ZOCOR) 40 MG tablet Take 1 tablet (40 mg total) by mouth at bedtime. 09/14/17  Yes Jearld Fenton, NP    Family History Family History  Problem Relation Age of Onset  . Ovarian cancer Mother   . Hyperlipidemia Mother   . Alcohol abuse Father   . Hyperlipidemia Father   .  Heart disease Father   . Stroke Father   . Hypertension Father     Social History Social History   Tobacco Use  . Smoking status: Never Smoker  . Smokeless tobacco: Never Used  Substance Use Topics  . Alcohol use: Yes    Comment: occasional  . Drug use: Yes     Allergies   Aspirin and Penicillins   Review of Systems Review of Systems  Constitutional: Negative for chills and fever.  Respiratory: Negative for shortness of breath.   Cardiovascular: Negative for chest pain.   Gastrointestinal: Positive for abdominal pain and nausea. Negative for blood in stool, constipation, diarrhea and vomiting.  Genitourinary: Negative for dysuria, frequency, hematuria, urgency, vaginal bleeding, vaginal discharge and vaginal pain.  Musculoskeletal: Positive for back pain.  All other systems reviewed and are negative.    Physical Exam Updated Vital Signs BP 98/68   Pulse 71   Temp 97.6 F (36.4 C)   Resp 18   LMP 09/10/2016   SpO2 97%   Physical Exam  Constitutional: She appears well-developed and well-nourished. No distress.  HENT:  Head: Normocephalic and atraumatic.  Eyes: Conjunctivae are normal. Right eye exhibits no discharge. Left eye exhibits no discharge.  Neck: No JVD present. No tracheal deviation present.  Cardiovascular: Normal rate, regular rhythm and normal heart sounds.  Pulmonary/Chest: Effort normal and breath sounds normal.  Abdominal: Soft. She exhibits no distension. Bowel sounds are decreased. There is no tenderness. There is no rigidity, no rebound, no guarding, no CVA tenderness, no tenderness at McBurney's point and negative Murphy's sign.  Genitourinary: Rectum normal and uterus normal. Cervix exhibits no motion tenderness, no discharge and no friability. Left adnexum displays tenderness. No tenderness in the vagina.  Genitourinary Comments: Examination performed in the presence of chaperone.  No masses or lesions to the external genitalia.  There are atrophic changes noted on examination.  Mild left adnexal tenderness  Musculoskeletal: She exhibits no edema.  No midline spine tenderness, right paralumbar muscle tenderness in the L4-5/L5-S1 region with no deformity, crepitus, or step-off noted.  5/5 strength of BLE major muscle groups.  Neurological: She is alert.  Skin: Skin is warm and dry. No erythema.  Psychiatric: She has a normal mood and affect. Her behavior is normal.  Nursing note and vitals reviewed.    ED Treatments / Results   Labs (all labs ordered are listed, but only abnormal results are displayed) Labs Reviewed  WET PREP, GENITAL - Abnormal; Notable for the following components:      Result Value   WBC, Wet Prep HPF POC MANY (*)    All other components within normal limits  COMPREHENSIVE METABOLIC PANEL - Abnormal; Notable for the following components:   Glucose, Bld 170 (*)    AST 44 (*)    ALT 94 (*)    All other components within normal limits  URINALYSIS, ROUTINE W REFLEX MICROSCOPIC - Abnormal; Notable for the following components:   Color, Urine COLORLESS (*)    Glucose, UA 50 (*)    All other components within normal limits  LIPASE, BLOOD  CBC  I-STAT BETA HCG BLOOD, ED ([facility], [facility], [facility] ONLY)  GC/CHLAMYDIA PROBE AMP ([facility]) NOT AT [facility]    EKG None  Radiology Ct Abdomen Pelvis Wo Contrast  Result Date: 09/17/2017 CLINICAL DATA:  Right-sided flank pain for 2 weeks EXAM: CT ABDOMEN AND PELVIS WITHOUT CONTRAST TECHNIQUE: Multidetector CT imaging of the abdomen and pelvis was performed following the standard protocol  without IV contrast. COMPARISON:  None. FINDINGS: Lower chest: No acute abnormality. Hepatobiliary: Mild fatty infiltration of the liver is noted. The gallbladder appears within normal limits. Pancreas: Unremarkable. No pancreatic ductal dilatation or surrounding inflammatory changes. Spleen: Normal in size without focal abnormality. Adrenals/Urinary Tract: The adrenal glands are within normal limits. Kidneys demonstrate some minimal fullness of the right renal pelvis although no ureteral fullness is noted. No calculi are identified. The bladder is well distended. Stomach/Bowel: Stomach is within normal limits. Appendix appears normal. No evidence of bowel wall thickening, distention, or inflammatory changes. Vascular/Lymphatic: Aortic atherosclerosis. No enlarged abdominal or pelvic lymph nodes. Reproductive: Uterus and bilateral adnexa are unremarkable. Other: No abdominal wall hernia or  abnormality. No abdominopelvic ascites. Musculoskeletal: No acute or significant osseous findings. IMPRESSION: Mild fatty infiltration of the liver. Very mild fullness of the right renal pelvis without definitive obstructive change. This is likely related to extrarenal pelvis. Electronically Signed   By: Inez Catalina M.D.   On: 09/17/2017 08:29    Procedures Procedures (including critical care time)  Medications Ordered in ED Medications  ondansetron (ZOFRAN-ODT) disintegrating tablet 4 mg (has no administration in time range)  ibuprofen (ADVIL,MOTRIN) tablet 800 mg (800 mg Oral Given 09/17/17 0841)     Initial Impression / Assessment and Plan / ED Course  I have reviewed the triage vital signs and the nursing notes.  Pertinent labs & imaging results that were available during my care of the patient were reviewed by me and considered in my medical decision making (see chart for details).     Patient presents for evaluation of lower abdominal pain which is cramping in nature for 2 weeks.  Currently no abdominal pain but she does have right-sided low back pain which is reproducible on palpation.  She is afebrile, vital signs are stable.  She is nontoxic in appearance.  Abdomen is soft.  Lab work reviewed by me shows no leukocytosis, no significant electrolyte abnormalities.  She does have very mildly elevated AST and ALT but no other abnormal LFTs.  UA is not concerning for UTI or nephrolithiasis.  She underwent CT scan of the abdomen and pelvis which showed mild fatty infiltration of the liver which could explain her mildly elevated LFTs.  It also showed very mild fullness of the right renal pelvis but no evidence of obstruction and her UA is not consistent with nephrolithiasis or UTI.  She has mild left adnexal tenderness on examination but no evidence of BV or yeast infection. Low suspicion of PID, ovarian torsion, or TOA.  She is resting comfortably in bed, pain improved with ibuprofen.  She is  tolerating p.o. fluids without difficulty.  I doubt obstruction, perforation, appendicitis, colitis, or other acute surgical abdominal pathology.  She is due for follow-up with her OB/GYN and repeat ultrasound which she undergoes yearly.  She does have a known history of endometriosis and I think this is likely contributing to her symptoms.  Also on the differential is an ovarian cyst although CT scan makes a comment that the uterus and bilateral adnexa are unremarkable.  No further emergent workup required at this time.  She will follow-up with her primary care physician for reevaluation of her LFTs and her OB/GYN for reevaluation of her abdominal pain.  Discussed strict ED return precautions.  Patient and patient's husband verbalized understanding of and agreement with plan and patient is stable for discharge home at this time.  Final Clinical Impressions(s) / ED Diagnoses   Final diagnoses:  Lower abdominal pain  Elevated LFTs    ED Discharge Orders    None       Renita Papa, PA-C 09/17/17 [place], [place], [place] 09/17/17 1522

## 2017-09-17 NOTE — ED Triage Notes (Signed)
Pt reports abdominal cramping for two weeks.  "it feels like I'm trying to start my period but I'm in menopause."  This morning she reports the cramping has moved to the right side of her lower back and down to her thighs.   She has been taking Alieve but it continues to come back.  Reports being "more tired than normal".

## 2017-09-17 NOTE — ED Notes (Signed)
Patient transported to CT 

## 2017-09-17 NOTE — Discharge Instructions (Signed)
Your workup today was reassuring but suggestive of possible endometriosis or ovarian pain. Alternate 600 mg of ibuprofen and [phone] mg of Tylenol every 3 hours as needed for pain. Do not exceed 4000 mg of Tylenol daily.  Take ibuprofen with food to avoid upset stomach issues.  Apply heating pad for comfort.  Follow-up with your primary care physician for reevaluation of your liver function tests.  Follow-up with your OB/GYN for reevaluation of your lower abdominal pain.  Return to the emergency department if any concerning signs or symptoms develop such as fever, persistent vomiting, or worsening pain.

## 2017-09-18 LAB — GC/CHLAMYDIA PROBE AMP (~~LOC~~) NOT AT ARMC
Chlamydia: NEGATIVE
Neisseria Gonorrhea: NEGATIVE

## 2017-09-20 ENCOUNTER — Encounter: Payer: Self-pay | Admitting: Internal Medicine

## 2017-09-24 DIAGNOSIS — R109 Unspecified abdominal pain: Secondary | ICD-10-CM | POA: Diagnosis not present

## 2017-10-08 ENCOUNTER — Other Ambulatory Visit: Payer: Self-pay | Admitting: Internal Medicine

## 2017-10-08 DIAGNOSIS — I10 Essential (primary) hypertension: Secondary | ICD-10-CM

## 2017-10-08 NOTE — Telephone Encounter (Signed)
Left voicemail for pt to return call to office. Patient must schedule annual exam for further refills.

## 2017-11-07 ENCOUNTER — Other Ambulatory Visit: Payer: Self-pay | Admitting: Internal Medicine

## 2017-12-04 DIAGNOSIS — B0052 Herpesviral keratitis: Secondary | ICD-10-CM | POA: Diagnosis not present

## 2017-12-07 DIAGNOSIS — B0052 Herpesviral keratitis: Secondary | ICD-10-CM | POA: Diagnosis not present

## 2017-12-10 DIAGNOSIS — B0052 Herpesviral keratitis: Secondary | ICD-10-CM | POA: Diagnosis not present

## 2017-12-10 DIAGNOSIS — H16002 Unspecified corneal ulcer, left eye: Secondary | ICD-10-CM | POA: Diagnosis not present

## 2017-12-10 DIAGNOSIS — H16012 Central corneal ulcer, left eye: Secondary | ICD-10-CM | POA: Diagnosis not present

## 2017-12-11 DIAGNOSIS — H16012 Central corneal ulcer, left eye: Secondary | ICD-10-CM | POA: Diagnosis not present

## 2017-12-12 DIAGNOSIS — H16012 Central corneal ulcer, left eye: Secondary | ICD-10-CM | POA: Diagnosis not present

## 2017-12-14 DIAGNOSIS — H16012 Central corneal ulcer, left eye: Secondary | ICD-10-CM | POA: Diagnosis not present

## 2017-12-15 DIAGNOSIS — H16012 Central corneal ulcer, left eye: Secondary | ICD-10-CM | POA: Diagnosis not present

## 2017-12-17 DIAGNOSIS — H16012 Central corneal ulcer, left eye: Secondary | ICD-10-CM | POA: Diagnosis not present

## 2017-12-18 ENCOUNTER — Other Ambulatory Visit: Payer: Self-pay | Admitting: Internal Medicine

## 2017-12-18 DIAGNOSIS — E7800 Pure hypercholesterolemia, unspecified: Secondary | ICD-10-CM

## 2017-12-20 DIAGNOSIS — H16012 Central corneal ulcer, left eye: Secondary | ICD-10-CM | POA: Diagnosis not present

## 2017-12-24 DIAGNOSIS — H169 Unspecified keratitis: Secondary | ICD-10-CM | POA: Diagnosis not present

## 2017-12-24 DIAGNOSIS — B49 Unspecified mycosis: Secondary | ICD-10-CM | POA: Diagnosis not present

## 2017-12-26 DIAGNOSIS — H02402 Unspecified ptosis of left eyelid: Secondary | ICD-10-CM | POA: Diagnosis not present

## 2017-12-26 DIAGNOSIS — H168 Other keratitis: Secondary | ICD-10-CM | POA: Diagnosis not present

## 2017-12-27 ENCOUNTER — Other Ambulatory Visit: Payer: Self-pay | Admitting: Internal Medicine

## 2017-12-27 DIAGNOSIS — H16012 Central corneal ulcer, left eye: Secondary | ICD-10-CM | POA: Diagnosis not present

## 2017-12-27 DIAGNOSIS — H02402 Unspecified ptosis of left eyelid: Secondary | ICD-10-CM | POA: Diagnosis not present

## 2017-12-27 MED ORDER — KETOROLAC TROMETHAMINE 10 MG PO TABS: 10.0000 mg | ORAL_TABLET | Freq: Three times a day (TID) | ORAL | 0 refills | Status: DC | PRN

## 2017-12-27 NOTE — Progress Notes (Unsigned)
keto

## 2018-01-02 DIAGNOSIS — H168 Other keratitis: Secondary | ICD-10-CM | POA: Diagnosis not present

## 2018-01-02 DIAGNOSIS — H16012 Central corneal ulcer, left eye: Secondary | ICD-10-CM | POA: Diagnosis not present

## 2018-01-02 DIAGNOSIS — H02402 Unspecified ptosis of left eyelid: Secondary | ICD-10-CM | POA: Diagnosis not present

## 2018-01-08 DIAGNOSIS — H02402 Unspecified ptosis of left eyelid: Secondary | ICD-10-CM | POA: Diagnosis not present

## 2018-01-08 DIAGNOSIS — H16012 Central corneal ulcer, left eye: Secondary | ICD-10-CM | POA: Diagnosis not present

## 2018-01-09 DIAGNOSIS — H168 Other keratitis: Secondary | ICD-10-CM | POA: Diagnosis not present

## 2018-01-16 DIAGNOSIS — H02402 Unspecified ptosis of left eyelid: Secondary | ICD-10-CM | POA: Diagnosis not present

## 2018-01-16 DIAGNOSIS — H16012 Central corneal ulcer, left eye: Secondary | ICD-10-CM | POA: Diagnosis not present

## 2018-01-16 DIAGNOSIS — H168 Other keratitis: Secondary | ICD-10-CM | POA: Diagnosis not present

## 2018-01-29 ENCOUNTER — Telehealth: Payer: Self-pay

## 2018-01-29 ENCOUNTER — Encounter: Payer: Self-pay | Admitting: Internal Medicine

## 2018-01-29 NOTE — Telephone Encounter (Signed)
Labs for CMET and Lipid already ordered. A1C and TSH, Free T4 were normal at last check, no indication to repeat at this time unless she is having issues, in which case she should make an OV to discuss.

## 2018-01-29 NOTE — Telephone Encounter (Signed)
CMP and lipid already has future orders.

## 2018-01-29 NOTE — Telephone Encounter (Signed)
Copied from [place] [id number]. Topic: General - Other >> Jan 29, 2018  8:18 AM Valla Leaver wrote: Reason for CRM: Patient wants [facility] to order labs for kidney and liver function, with A1C, thyroid, and cholesrerol. Please call orders are placed.

## 2018-02-01 ENCOUNTER — Encounter: Payer: Self-pay | Admitting: Internal Medicine

## 2018-02-01 ENCOUNTER — Other Ambulatory Visit (INDEPENDENT_AMBULATORY_CARE_PROVIDER_SITE_OTHER): Payer: BLUE CROSS/BLUE SHIELD

## 2018-02-01 DIAGNOSIS — E7800 Pure hypercholesterolemia, unspecified: Secondary | ICD-10-CM

## 2018-02-01 DIAGNOSIS — H20052 Hypopyon, left eye: Secondary | ICD-10-CM | POA: Diagnosis not present

## 2018-02-01 DIAGNOSIS — H02402 Unspecified ptosis of left eyelid: Secondary | ICD-10-CM | POA: Diagnosis not present

## 2018-02-01 DIAGNOSIS — H168 Other keratitis: Secondary | ICD-10-CM | POA: Diagnosis not present

## 2018-02-01 LAB — COMPREHENSIVE METABOLIC PANEL
ALT: 31 U/L (ref 0–35)
AST: 21 U/L (ref 0–37)
Albumin: 5 g/dL (ref 3.5–5.2)
Alkaline Phosphatase: 64 U/L (ref 39–117)
BUN: 26 mg/dL — ABNORMAL HIGH (ref 6–23)
CO2: 29 mEq/L (ref 19–32)
Calcium: 10.5 mg/dL (ref 8.4–10.5)
Chloride: 98 mEq/L (ref 96–112)
Creatinine, Ser: 0.9 mg/dL (ref 0.40–1.20)
GFR: 68.81 mL/min (ref >60.00)
Glucose, Bld: 142 mg/dL — ABNORMAL HIGH (ref 70–99)
Potassium: 4.2 mEq/L (ref 3.5–5.1)
Sodium: 137 mEq/L (ref 135–145)
Total Bilirubin: 0.5 mg/dL (ref 0.2–1.2)
Total Protein: 8 g/dL (ref 6.0–8.3)

## 2018-02-01 LAB — LIPID PANEL
Cholesterol: 230 mg/dL — ABNORMAL HIGH (ref 0–200)
HDL: 29.3 mg/dL — ABNORMAL LOW (ref >39.00)
NonHDL: 200.58
Total CHOL/HDL Ratio: 8
Triglycerides: 312 mg/dL — ABNORMAL HIGH (ref 0.0–149.0)
VLDL: 62.4 mg/dL — ABNORMAL HIGH (ref 0.0–40.0)

## 2018-02-01 LAB — LDL CHOLESTEROL, DIRECT: Direct LDL: 143 mg/dL

## 2018-02-08 DIAGNOSIS — H168 Other keratitis: Secondary | ICD-10-CM | POA: Diagnosis not present

## 2018-02-08 DIAGNOSIS — H02402 Unspecified ptosis of left eyelid: Secondary | ICD-10-CM | POA: Diagnosis not present

## 2018-02-08 DIAGNOSIS — Z1231 Encounter for screening mammogram for malignant neoplasm of breast: Secondary | ICD-10-CM | POA: Diagnosis not present

## 2018-02-08 DIAGNOSIS — Z83518 Family history of other specified eye disorder: Secondary | ICD-10-CM | POA: Diagnosis not present

## 2018-02-22 DIAGNOSIS — H168 Other keratitis: Secondary | ICD-10-CM | POA: Diagnosis not present

## 2018-02-22 DIAGNOSIS — H02402 Unspecified ptosis of left eyelid: Secondary | ICD-10-CM | POA: Diagnosis not present

## 2018-02-22 DIAGNOSIS — Z83518 Family history of other specified eye disorder: Secondary | ICD-10-CM | POA: Diagnosis not present

## 2018-02-27 DIAGNOSIS — H02402 Unspecified ptosis of left eyelid: Secondary | ICD-10-CM | POA: Diagnosis not present

## 2018-02-27 DIAGNOSIS — H168 Other keratitis: Secondary | ICD-10-CM | POA: Diagnosis not present

## 2018-02-27 DIAGNOSIS — Z83518 Family history of other specified eye disorder: Secondary | ICD-10-CM | POA: Diagnosis not present

## 2018-03-07 ENCOUNTER — Encounter: Payer: Self-pay | Admitting: Internal Medicine

## 2018-03-07 ENCOUNTER — Ambulatory Visit: Payer: BLUE CROSS/BLUE SHIELD | Admitting: Internal Medicine

## 2018-03-07 VITALS — BP 118/80 | HR 84 | Temp 98.5°F | Wt 174.0 lb

## 2018-03-07 DIAGNOSIS — Z23 Encounter for immunization: Secondary | ICD-10-CM | POA: Diagnosis not present

## 2018-03-07 DIAGNOSIS — R300 Dysuria: Secondary | ICD-10-CM

## 2018-03-07 DIAGNOSIS — R8290 Unspecified abnormal findings in urine: Secondary | ICD-10-CM | POA: Diagnosis not present

## 2018-03-07 DIAGNOSIS — R21 Rash and other nonspecific skin eruption: Secondary | ICD-10-CM | POA: Diagnosis not present

## 2018-03-07 LAB — POC URINALSYSI DIPSTICK (AUTOMATED)
Bilirubin, UA: NEGATIVE
Blood, UA: NEGATIVE
Glucose, UA: NEGATIVE
Ketones, UA: NEGATIVE
Nitrite, UA: NEGATIVE
Protein, UA: NEGATIVE
Spec Grav, UA: 1.025 (ref 1.010–1.025)
Urobilinogen, UA: 0.2 E.U./dL
pH, UA: 6 (ref 5.0–8.0)

## 2018-03-07 MED ORDER — CLOBETASOL PROPIONATE 0.05 % EX CREA: 1.0000 "application " | TOPICAL_CREAM | Freq: Two times a day (BID) | CUTANEOUS | 0 refills | Status: DC

## 2018-03-07 NOTE — Patient Instructions (Signed)

## 2018-03-07 NOTE — Addendum Note (Signed)
Addended by: Lurlean Nanny on: 03/07/2018 12:47 PM   Modules accepted: Orders

## 2018-03-07 NOTE — Progress Notes (Signed)
Subjective:    Patient ID: Mary Schaefer, female    DOB: 04-21-1961, 57 y.o.   MRN: [id number]  HPI  Pt presents to the clinic today with c/o a rash. She first noticed this a few weeks ago. It started on her lower legs. The rash is itchy. She denies changes in soaps, lotions or detergents. She is taking multiple eye drops which are new, but denies changes in oral medications. No one in her family has a similar rash. She has tried Hydrocortisone Cream OTC.  She also reports dysuria and foul urine odor. She noticed this 2 days ago. She denies urgency, frequency or blood in her urine. She denies vaginal complaints. She has not tried anything OTC for these symptoms.  Review of Systems  Past Medical History:  Diagnosis Date  . Blood in stool   . Childhood asthma   . Endometriosis 1997  . Hypertension   . Thyroid disease     Current Outpatient Medications  Medication Sig Dispense Refill  . acetaminophen (TYLENOL) 500 MG tablet Take 1,000 mg by mouth as needed for mild pain.    [name] BEPREVE 1.5 % SOLN PLACE 1 DROP INTO BOTH EYES TWICE DAILY AS NEEDED FOR ALLERGIES  3  . cyclobenzaprine (FLEXERIL) 10 MG tablet Take 10 mg by mouth 3 (three) times daily.  0  . hydrOXYzine (ATARAX/VISTARIL) 25 MG tablet Take 1 tablet (25 mg total) by mouth 3 (three) times daily as needed. 30 tablet 2  . ketorolac (TORADOL) 10 MG tablet Take 1 tablet (10 mg total) by mouth every 8 (eight) hours as needed. 30 tablet 0  . levothyroxine (SYNTHROID, LEVOTHROID) 150 MCG tablet TAKE 1 TABLET (150 MCG TOTAL) BY MOUTH DAILY BEFORE BREAKFAST. 90 tablet 2  . lisinopril-hydrochlorothiazide (PRINZIDE,ZESTORETIC) 10-12.5 MG tablet TAKE 1 TABLET BY MOUTH EVERY DAY 90 tablet 2  . naproxen sodium (ALEVE) 220 MG tablet Take 440 mg by mouth as needed (pain).    . ranitidine (ZANTAC) 150 MG tablet Take 150 mg by mouth as needed for heartburn.    . simvastatin (ZOCOR) 40 MG tablet TAKE 1 TABLET BY MOUTH EVERYDAY AT BEDTIME 90  tablet 2   No current facility-administered medications for this visit.     Allergies  Allergen Reactions  . Aspirin Shortness Of Breath and Hives  . Penicillins Hives    Family History  Problem Relation Age of Onset  . Ovarian cancer Mother   . Hyperlipidemia Mother   . Alcohol abuse Father   . Hyperlipidemia Father   . Heart disease Father   . Stroke Father   . Hypertension Father     Social History   Socioeconomic History  . Marital status: Unknown    Spouse name: Not on file  . Number of children: Not on file  . Years of education: Not on file  . Highest education level: Not on file  Occupational History  . Not on file  Social Needs  . Financial resource strain: Not on file  . Food insecurity:    Worry: Not on file    Inability: Not on file  . Transportation needs:    Medical: Not on file    Non-medical: Not on file  Tobacco Use  . Smoking status: Never Smoker  . Smokeless tobacco: Never Used  Substance and Sexual Activity  . Alcohol use: Yes    Comment: occasional  . Drug use: Yes  . Sexual activity: Yes  Lifestyle  . Physical activity:  Days per week: Not on file    Minutes per session: Not on file  . Stress: Not on file  Relationships  . Social connections:    Talks on phone: Not on file    Gets together: Not on file    Attends religious service: Not on file    Active member of club or organization: Not on file    Attends meetings of clubs or organizations: Not on file    Relationship status: Not on file  . Intimate partner violence:    Fear of current or ex partner: Not on file    Emotionally abused: Not on file    Physically abused: Not on file    Forced sexual activity: Not on file  Other Topics Concern  . Not on file  Social History Narrative  . Not on file     Constitutional: Denies fever, malaise, fatigue, headache or abrupt weight changes.  Gastrointestinal: Denies abdominal pain, bloating, constipation, diarrhea or blood in  the stool.  GU: Pt reports dysuria, urine odor. Denies urgency, frequency, burning sensation, blood in urine, or discharge. Musculoskeletal: Denies decrease in range of motion, difficulty with gait, muscle pain or joint pain and swelling.  Skin: Pt reports rash. Denies ulcercations.    No other specific complaints in a complete review of systems (except as listed in HPI above).     Objective:   Physical Exam  BP 118/80   Pulse 84   Temp 98.5 F (36.9 C) (Oral)   Wt 174 lb (78.9 kg)   LMP 09/10/2016   SpO2 98%   BMI 28.08 kg/m  Wt Readings from Last 3 Encounters:  03/07/18 174 lb (78.9 kg)  09/06/17 189 lb (85.7 kg)  02/14/17 176 lb 8 oz (80.1 kg)    General: Appears her stated age, well developed, well nourished in NAD. Skin: Scattered, scaly patches noted on BLE. Abdomen: Soft and nontender. Normal bowel sounds. No distention or masses noted. No CVA tenderness noted.   BMET    Component Value Date/Time   NA 137 02/01/2018 0758   K 4.2 02/01/2018 0758   CL 98 02/01/2018 0758   CO2 29 02/01/2018 0758   GLUCOSE 142 (H) 02/01/2018 0758   BUN 26 (H) 02/01/2018 0758   CREATININE 0.90 02/01/2018 0758   CREATININE 0.87 03/31/2016 1606   CALCIUM 10.5 02/01/2018 0758   GFRNONAA >60 09/17/2017 0732   GFRAA >60 09/17/2017 0732    Lipid Panel     Component Value Date/Time   CHOL 230 (H) 02/01/2018 0758   TRIG 312.0 (H) 02/01/2018 0758   HDL 29.30 (L) 02/01/2018 0758   CHOLHDL 8 02/01/2018 0758   VLDL 62.4 (H) 02/01/2018 0758   LDLCALC 154 (H) 09/06/2017 1601    CBC    Component Value Date/Time   WBC 6.0 09/17/2017 0732   RBC 4.85 09/17/2017 0732   HGB 14.4 09/17/2017 0732   HCT 44.0 09/17/2017 0732   PLT 225 09/17/2017 0732   MCV 90.7 09/17/2017 0732   MCH 29.7 09/17/2017 0732   MCHC 32.7 09/17/2017 0732   RDW 13.1 09/17/2017 0732    Hgb A1C Lab Results  Component Value Date   HGBA1C 5.1 09/07/2017            Assessment & Plan:    Rash:  Resembles eczema She is concerned about drug rash, but no new meds other than eye drops, does not look like drug rash eRx for Clobetasol cream BID x 2 weeks  Dysuria, Urine Odor:  Urinalysis: 1+ leuks Will send urine culture Push fluids  Return precautions discussed Webb Silversmith, NP

## 2018-03-08 DIAGNOSIS — Z83518 Family history of other specified eye disorder: Secondary | ICD-10-CM | POA: Diagnosis not present

## 2018-03-08 DIAGNOSIS — H168 Other keratitis: Secondary | ICD-10-CM | POA: Diagnosis not present

## 2018-03-08 DIAGNOSIS — B49 Unspecified mycosis: Secondary | ICD-10-CM | POA: Insufficient documentation

## 2018-03-08 DIAGNOSIS — H02402 Unspecified ptosis of left eyelid: Secondary | ICD-10-CM | POA: Diagnosis not present

## 2018-03-08 LAB — URINE CULTURE
MICRO NUMBER:: 91158750
SPECIMEN QUALITY:: ADEQUATE

## 2018-03-17 ENCOUNTER — Other Ambulatory Visit: Payer: Self-pay | Admitting: Internal Medicine

## 2018-03-17 ENCOUNTER — Encounter: Payer: Self-pay | Admitting: Internal Medicine

## 2018-03-18 MED ORDER — HYDROXYZINE HCL 25 MG PO TABS: 25.0000 mg | ORAL_TABLET | Freq: Three times a day (TID) | ORAL | 2 refills | Status: DC | PRN

## 2018-04-10 ENCOUNTER — Other Ambulatory Visit: Payer: Self-pay | Admitting: Internal Medicine

## 2018-04-11 NOTE — Telephone Encounter (Signed)
Can we call and get an update on her eye and see what is going on before we just refill it?

## 2018-04-11 NOTE — Telephone Encounter (Signed)
Last filled 12/27/17.... Please advise

## 2018-04-16 ENCOUNTER — Telehealth: Payer: Self-pay | Admitting: *Deleted

## 2018-04-16 ENCOUNTER — Other Ambulatory Visit: Payer: Self-pay | Admitting: Internal Medicine

## 2018-04-16 MED ORDER — PRAVASTATIN SODIUM 40 MG PO TABS: 40.0000 mg | ORAL_TABLET | Freq: Every day | ORAL | 2 refills | Status: DC

## 2018-04-16 NOTE — Telephone Encounter (Signed)
Will switch to Pravastatin, same dose. RX sent to pharmacy.

## 2018-04-16 NOTE — Telephone Encounter (Signed)
Spoke to pt who states she is currently on 400mg  of VORICONAZOLE per day and was advised there was a contraindication with her simvastatin. She has been on it for 2wks, and was advised today she will need to continue taking it, for 30 days and wanted to know how she should proceed regarding her cholesterol since she will be on this medication longer than expected. pls advise

## 2018-04-17 MED ORDER — KETOROLAC TROMETHAMINE 10 MG PO TABS: 10.0000 mg | ORAL_TABLET | Freq: Three times a day (TID) | ORAL | 0 refills | Status: DC | PRN

## 2018-04-17 NOTE — Addendum Note (Signed)
Addended by: Jearld Fenton on: 04/17/2018 02:00 PM   Modules accepted: Orders

## 2018-04-17 NOTE — Telephone Encounter (Signed)
Mary Schaefer            moxifloxacin 0.5 % eye drops  Add as: unknown   moxifloxacin 0.5 % eye drops   [facility] 03/07/2018    polyhexamethylene biguanide (BAQUACIL) 0.02 % ophthalmic solution   Add as: unknown   Place 1 drop into the left eye once 03/14/2018  [place] 04/12/2018    VORICONAZOLE IV           amphotericin B ophthalmic solution 1.5 mg/mL    Add as: unknown   Place 1 drop into the left eye every 1 hour **Refrigerate** 03/14/2018   erythromycin 5 mg/gram (0.5 %) eye ointment APPLY 1/8 INCH RIBBON TO BOTTOM EYELIDS AT BEDTIME New     erythromycin 5 mg/gram (0.5 %) eye ointment APPLY 1/8 INCH RIBBON TO BOTTOM EYELIDS AT BEDTIME   fluconazole 200 mg tablet New    Add as: fluconazole (DIFLUCAN) 200 MG tablet   fluconazole 200 mg tablet

## 2018-04-17 NOTE — Telephone Encounter (Signed)
Pt reports she recently had a cornea transplant and is on a lot of antifungal, antiviral, Ax and steroid eye drops as well as oral antifungal and Ax--Augmentin... Please advise if still not okay to refill Toradol

## 2018-04-17 NOTE — Telephone Encounter (Signed)
Pt is aware as instructed 

## 2018-04-17 NOTE — Telephone Encounter (Signed)
I am happy to fill as long as she wasn't given any pain meds from her surgeon?

## 2018-04-17 NOTE — Telephone Encounter (Signed)
refilled 

## 2018-04-25 ENCOUNTER — Ambulatory Visit (INDEPENDENT_AMBULATORY_CARE_PROVIDER_SITE_OTHER): Payer: Managed Care, Other (non HMO) | Admitting: Internal Medicine

## 2018-04-25 ENCOUNTER — Encounter: Payer: Self-pay | Admitting: Internal Medicine

## 2018-04-25 VITALS — BP 122/80 | HR 71 | Temp 98.2°F | Wt 174.0 lb

## 2018-04-25 DIAGNOSIS — E039 Hypothyroidism, unspecified: Secondary | ICD-10-CM

## 2018-04-25 DIAGNOSIS — L659 Nonscarring hair loss, unspecified: Secondary | ICD-10-CM

## 2018-04-25 DIAGNOSIS — B49 Unspecified mycosis: Secondary | ICD-10-CM

## 2018-04-25 DIAGNOSIS — B351 Tinea unguium: Secondary | ICD-10-CM

## 2018-04-25 DIAGNOSIS — Z947 Corneal transplant status: Secondary | ICD-10-CM

## 2018-04-25 DIAGNOSIS — T887XXA Unspecified adverse effect of drug or medicament, initial encounter: Secondary | ICD-10-CM

## 2018-04-25 DIAGNOSIS — H168 Other keratitis: Secondary | ICD-10-CM | POA: Diagnosis not present

## 2018-04-25 LAB — COMPREHENSIVE METABOLIC PANEL
ALT: 64 U/L — ABNORMAL HIGH (ref 0–35)
AST: 32 U/L (ref 0–37)
Albumin: 4.6 g/dL (ref 3.5–5.2)
Alkaline Phosphatase: 82 U/L (ref 39–117)
BUN: 19 mg/dL (ref 6–23)
CO2: 28 mEq/L (ref 19–32)
Calcium: 9.7 mg/dL (ref 8.4–10.5)
Chloride: 104 mEq/L (ref 96–112)
Creatinine, Ser: 0.67 mg/dL (ref 0.40–1.20)
GFR: 96.65 mL/min (ref >60.00)
Glucose, Bld: 150 mg/dL — ABNORMAL HIGH (ref 70–99)
Potassium: 3.6 mEq/L (ref 3.5–5.1)
Sodium: 141 mEq/L (ref 135–145)
Total Bilirubin: 0.3 mg/dL (ref 0.2–1.2)
Total Protein: 7.3 g/dL (ref 6.0–8.3)

## 2018-04-25 LAB — TSH: TSH: 0.21 u[IU]/mL — ABNORMAL LOW (ref 0.35–4.50)

## 2018-04-25 NOTE — Patient Instructions (Signed)
Corneal Transplant, Care After This sheet gives you information about how to care for yourself after your procedure. Your health care provider may also give you more specific instructions. If you have problems or questions, contact your health care provider. What can I expect after the procedure? After the procedure, it is common to have:  Mild pain.  Vision that is temporarily worse than before the procedure. This is normal.  Swelling in the new cornea, which results in poor vision.  Light sensitivity.  Redness of the eye.  Healing can take a few weeks to several months. Your vision will gradually improve after the surgery, often taking 6-12 months. Follow these instructions at home: Medicines  Take or use over-the-counter and prescription medicines only as told by your health care provider.  If you were prescribed antibiotic medicines, use them as told by your health care provider. Do not stop using the antibiotics even if you start to feel better. Activity  Rest as told by your health care provider, then return to your normal activities as told by your health care provider. Ask your health care provider what activities are safe for you.  Do not lift anything that is heavier than the limit that your health care provider tells you.  Do not drive for 24 hours if you were given a medicine to help you relax (sedative). General instructions  Wear a hard, protective eye shield as long as told by your health care provider. Then wear glasses during the day and the hard eye shield at bedtime for several months or as told by your health care provider. This helps to prevent any injury to your eye as it heals.  Keep your eye clean and dry.  Do not touch or rub your eye.  The eye is weaker forever after a corneal transplant, so it is very important not to get hit or poked in the eye at any time after surgery. Trauma to the eye could open up the corneal transplant wound and cause the inside  of the eye to come out, leading to much worse vision or even blindness in that eye.  Keep all follow-up visits as told by your health care provider. This is important because it allows your health care provider to watch for tissue rejection. Contact a health care provider if:  You have more redness, swelling, or pain in either eye.  You have more discharge from either eye.  You have a fever. Get help right away if:  You develop a change in vision such as light flashes, light or dark spots (floaters), lines, holes, or clouding of your vision.  You have a sudden gush of clear fluid from the affected eye.  You receive any kind of hard hit (blow), or something hits you on the affected eye.  You feel nauseous or throw up. Summary  After the procedure, it is common to have pain, vision changes, redness and be sensitive to light in the affected eye.  Healing can take a few weeks to several months. Your vision will gradually improve after the surgery, often taking 6-12 months.  Wear a hard, protective eye shield as told by your health care provider. Then wear glasses during the day and the hard eye shield at bedtime for several months or as told by your health care provider.This helps to prevent any injury to your eye as it heals. This information is not intended to replace advice given to you by your health care provider. Make sure you discuss  any questions you have with your health care provider. Document Released: 12/16/2004 Document Revised: 02/24/2016 Document Reviewed: 02/24/2016 Elsevier Interactive Patient Education  [employer].

## 2018-04-25 NOTE — Progress Notes (Signed)
Subjective:    Patient ID: Mary Schaefer, female    DOB: 1961/04/18, 57 y.o.   MRN: [id number]  HPI  Pt presents to the clinic today with c/o medication problem. 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 He felt like oral Voriconazole was causing elevated liver enzymes.She is also experiencing hair loss and discoloration of her left great toenail. He stopped it and put her on Moxifloxicin. He has her on topical Voriconazole and Predforte. She follows up with [facility] today.  Review of Systems  Past Medical History:  Diagnosis Date  . Blood in stool   . Childhood asthma   . Endometriosis 1997  . Hypertension   . Thyroid disease     Current Outpatient Medications  Medication Sig Dispense Refill  . acetaminophen (TYLENOL) 500 MG tablet Take 1,000 mg by mouth as needed for mild pain.    . AMPHOTERICIN B IJ Apply 1 drop to eye every 2 (two) hours while awake.    [name] atropine 0.08 mg/mL SOLN atropine 1 % eye drops    . atropine 1 % ophthalmic solution INSTILL 1 DROP INTO LEFT EYE TWICE A DAY  11  . BEPREVE 1.5 % SOLN PLACE 1 DROP INTO BOTH EYES TWICE DAILY AS NEEDED FOR ALLERGIES  3  . clobetasol cream (TEMOVATE) 4.09 % Apply 1 application topically 2 (two) times daily. 30 g 0  . cyclobenzaprine (FLEXERIL) 10 MG tablet Take 10 mg by mouth 3 (three) times daily.  0  . hydrOXYzine (ATARAX/VISTARIL) 25 MG tablet Take 1 tablet (25 mg total) by mouth 3 (three) times daily as needed. 30 tablet 2  . ketorolac (TORADOL) 10 MG tablet Take 1 tablet (10 mg total) by mouth every 8 (eight) hours as needed. 30 tablet 0  . levothyroxine (SYNTHROID, LEVOTHROID) 150 MCG tablet TAKE 1 TABLET (150 MCG TOTAL) BY MOUTH DAILY BEFORE BREAKFAST. 90 tablet 2  . lisinopril-hydrochlorothiazide (PRINZIDE,ZESTORETIC) 10-12.5 MG tablet TAKE 1 TABLET BY MOUTH EVERY DAY 90 tablet 2  . LYRICA 75 MG capsule TAKE 1 CAP BY  MOUTH 2 TIMES DAILY MAY INCREASE TO 150 MG TWICE DAILY WITHIN 1 WEEK  1  . naproxen sodium (ALEVE) 220 MG tablet Take 440 mg by mouth as needed (pain).    . Polyaminopropyl Biguanide (POLYHEXAMETHYLENE BIGUANIDE) 20 % SOLN Apply 1 drop to eye every hour.    . pravastatin (PRAVACHOL) 40 MG tablet Take 1 tablet (40 mg total) by mouth daily. 30 tablet 2  . ranitidine (ZANTAC) 150 MG tablet Take 150 mg by mouth as needed for heartburn.    [name] VORICONAZOLE IV Apply 1 drop to eye every 2 (two) hours while awake. 10mg /mL     No current facility-administered medications for this visit.     Allergies  Allergen Reactions  . Aspirin Shortness Of Breath and Hives  . Penicillins Hives    Family History  Problem Relation Age of Onset  . Ovarian cancer Mother   . Hyperlipidemia Mother   . Alcohol abuse Father   . Hyperlipidemia Father   . Heart disease Father   . Stroke Father   . Hypertension Father     Social History   Socioeconomic History  . Marital status: Unknown    Spouse name: Not on file  . Number of children: Not on file  . Years of education: Not on file  . Highest education level: Not on file  Occupational History  . Not on file  Social Needs  . Financial resource strain: Not on file  . Food insecurity:    Worry: Not on file    Inability: Not on file  . Transportation needs:    Medical: Not on file    Non-medical: Not on file  Tobacco Use  . Smoking status: Never Smoker  . Smokeless tobacco: Never Used  Substance and Sexual Activity  . Alcohol use: Yes    Comment: occasional  . Drug use: Yes  . Sexual activity: Yes  Lifestyle  . Physical activity:    Days per week: Not on file    Minutes per session: Not on file  . Stress: Not on file  Relationships  . Social connections:    Talks on phone: Not on file    Gets together: Not on file    Attends religious service: Not on file    Active member of club or organization: Not on file    Attends meetings of clubs or  organizations: Not on file    Relationship status: Not on file  . Intimate partner violence:    Fear of current or ex partner: Not on file    Emotionally abused: Not on file    Physically abused: Not on file    Forced sexual activity: Not on file  Other Topics Concern  . Not on file  Social History Narrative  . Not on file     Constitutional: Pt reports hair loss. Denies fever, malaise, fatigue, headache or abrupt weight changes.  Respiratory: Denies difficulty breathing, shortness of breath, cough or sputum production.   Cardiovascular: Denies chest pain, chest tightness, palpitations or swelling in the hands or feet.  Skin: Pt reports discoloration of left great toenail. Denies redness, rashes, lesions or ulcercations.    No other specific complaints in a complete review of systems (except as listed in HPI above).     Objective:   Physical Exam   BP 122/80   Pulse 71   Temp 98.2 F (36.8 C) (Oral)   Wt 174 lb (78.9 kg)   LMP 09/10/2016   SpO2 99%   BMI 28.08 kg/m  Wt Readings from Last 3 Encounters:  04/25/18 174 lb (78.9 kg)  03/07/18 174 lb (78.9 kg)  09/06/17 189 lb (85.7 kg)    General: Appears her stated age, well developed, well nourished in NAD. Skin: Warm, dry and intact. Let great toenail thick, black, lifting away from nail bed. No bald patches noted of the head. Cardiovascular: Normal rate and rhythm.  Pulmonary/Chest: Normal effort and positive vesicular breath sounds. No respiratory distress. No wheezes, rales or ronchi noted.  Neurological: Alert and oriented.   BMET    Component Value Date/Time   NA 137 02/01/2018 0758   K 4.2 02/01/2018 0758   CL 98 02/01/2018 0758   CO2 29 02/01/2018 0758   GLUCOSE 142 (H) 02/01/2018 0758   BUN 26 (H) 02/01/2018 0758   CREATININE 0.90 02/01/2018 0758   CREATININE 0.87 03/31/2016 1606   CALCIUM 10.5 02/01/2018 0758   GFRNONAA >60 09/17/2017 0732   GFRAA >60 09/17/2017 0732    Lipid Panel       Component Value Date/Time   CHOL 230 (H) 02/01/2018 0758   TRIG 312.0 (H) 02/01/2018 0758   HDL 29.30 (L) 02/01/2018 0758   CHOLHDL 8 02/01/2018 0758   VLDL 62.4 (H) 02/01/2018 0758   LDLCALC 154 (H) 09/06/2017 1601    CBC  Component Value Date/Time   WBC 6.0 09/17/2017 0732   RBC 4.85 09/17/2017 0732   HGB 14.4 09/17/2017 0732   HCT 44.0 09/17/2017 0732   PLT 225 09/17/2017 0732   MCV 90.7 09/17/2017 0732   MCH 29.7 09/17/2017 0732   MCHC 32.7 09/17/2017 0732   RDW 13.1 09/17/2017 0732    Hgb A1C Lab Results  Component Value Date   HGBA1C 5.1 09/07/2017           Assessment & Plan:   Hair Loss, Fungal Infection of Nail, Medication Side Effect:  Will check TSH and repeat liver enzymes today If liver enzymes abnormal, will need to hold off on oral antifungal Can try Penlac OTC Follow up with [facility] as scheduled  RTC as needed, will follow up after labs Webb Silversmith, NP

## 2018-04-26 MED ORDER — LEVOTHYROXINE SODIUM 112 MCG PO TABS
112.0000 ug | ORAL_TABLET | Freq: Every day | ORAL | 0 refills | Status: DC
Start: 2018-04-26 — End: 2018-05-30

## 2018-04-26 NOTE — Addendum Note (Signed)
Addended by: Lurlean Nanny on: 04/26/2018 12:52 PM   Modules accepted: Orders

## 2018-05-20 ENCOUNTER — Other Ambulatory Visit: Payer: Self-pay | Admitting: Internal Medicine

## 2018-05-20 DIAGNOSIS — E039 Hypothyroidism, unspecified: Secondary | ICD-10-CM

## 2018-05-21 NOTE — Telephone Encounter (Signed)
Pt has lab only appt scheduled will wait until results are back

## 2018-05-24 ENCOUNTER — Other Ambulatory Visit: Payer: Managed Care, Other (non HMO)

## 2018-05-24 ENCOUNTER — Other Ambulatory Visit (INDEPENDENT_AMBULATORY_CARE_PROVIDER_SITE_OTHER): Payer: Managed Care, Other (non HMO)

## 2018-05-24 DIAGNOSIS — E7800 Pure hypercholesterolemia, unspecified: Secondary | ICD-10-CM

## 2018-05-24 DIAGNOSIS — T887XXA Unspecified adverse effect of drug or medicament, initial encounter: Secondary | ICD-10-CM

## 2018-05-24 DIAGNOSIS — E039 Hypothyroidism, unspecified: Secondary | ICD-10-CM

## 2018-05-24 NOTE — Addendum Note (Signed)
Addended by: Ellamae Sia on: 05/24/2018 04:09 PM   Modules accepted: Orders

## 2018-05-25 LAB — COMPREHENSIVE METABOLIC PANEL
AG Ratio: 1.8 (calc) (ref 1.0–2.5)
ALT: 46 U/L — ABNORMAL HIGH (ref 6–29)
AST: 23 U/L (ref 10–35)
Albumin: 4.9 g/dL (ref 3.6–5.1)
Alkaline phosphatase (APISO): 83 U/L (ref 33–130)
BUN: 23 mg/dL (ref 7–25)
CO2: 29 mmol/L (ref 20–32)
Calcium: 10.6 mg/dL — ABNORMAL HIGH (ref 8.6–10.4)
Chloride: 97 mmol/L — ABNORMAL LOW (ref 98–110)
Creat: 0.83 mg/dL (ref 0.50–1.05)
Globulin: 2.8 g/dL (calc) (ref 1.9–3.7)
Glucose, Bld: 146 mg/dL — ABNORMAL HIGH (ref 65–99)
Potassium: 3.8 mmol/L (ref 3.5–5.3)
Sodium: 137 mmol/L (ref 135–146)
Total Bilirubin: 0.4 mg/dL (ref 0.2–1.2)
Total Protein: 7.7 g/dL (ref 6.1–8.1)

## 2018-05-25 LAB — LIPID PANEL
Cholesterol: 317 mg/dL — ABNORMAL HIGH (ref <200)
HDL: 46 mg/dL — ABNORMAL LOW (ref >50)
LDL Cholesterol (Calc): 219 mg/dL (calc) — ABNORMAL HIGH
Non-HDL Cholesterol (Calc): 271 mg/dL (calc) — ABNORMAL HIGH (ref <130)
Total CHOL/HDL Ratio: 6.9 (calc) — ABNORMAL HIGH (ref <5.0)
Triglycerides: 308 mg/dL — ABNORMAL HIGH (ref <150)

## 2018-05-25 LAB — TSH: TSH: 4.54 mIU/L — ABNORMAL HIGH (ref 0.40–4.50)

## 2018-05-30 ENCOUNTER — Encounter: Payer: Self-pay | Admitting: Internal Medicine

## 2018-05-30 DIAGNOSIS — I10 Essential (primary) hypertension: Secondary | ICD-10-CM

## 2018-05-30 DIAGNOSIS — E039 Hypothyroidism, unspecified: Secondary | ICD-10-CM

## 2018-05-30 MED ORDER — LEVOTHYROXINE SODIUM 112 MCG PO TABS: 112.0000 ug | ORAL_TABLET | Freq: Every day | ORAL | 0 refills | Status: DC

## 2018-05-30 MED ORDER — ROSUVASTATIN CALCIUM 10 MG PO TABS: 10.0000 mg | ORAL_TABLET | Freq: Every day | ORAL | 0 refills | Status: DC

## 2018-05-30 MED ORDER — LISINOPRIL-HYDROCHLOROTHIAZIDE 10-12.5 MG PO TABS: 1.0000 | ORAL_TABLET | Freq: Every day | ORAL | 0 refills | Status: DC

## 2018-05-30 NOTE — Addendum Note (Signed)
Addended by: Lurlean Nanny on: 05/30/2018 12:23 PM   Modules accepted: Orders

## 2018-05-30 NOTE — Addendum Note (Signed)
Addended by: Lurlean Nanny on: 05/30/2018 12:26 PM   Modules accepted: Orders

## 2018-05-31 MED ORDER — LEVOTHYROXINE SODIUM 112 MCG PO TABS: 112.0000 ug | ORAL_TABLET | Freq: Every day | ORAL | 0 refills | Status: DC

## 2018-05-31 NOTE — Addendum Note (Signed)
Addended by: Lurlean Nanny on: 05/31/2018 08:42 AM   Modules accepted: Orders

## 2018-06-29 ENCOUNTER — Ambulatory Visit (INDEPENDENT_AMBULATORY_CARE_PROVIDER_SITE_OTHER): Payer: Managed Care, Other (non HMO) | Admitting: Family Medicine

## 2018-06-29 ENCOUNTER — Encounter: Payer: Self-pay | Admitting: Family Medicine

## 2018-06-29 VITALS — BP 128/86 | HR 94 | Temp 98.0°F | Ht 66.0 in | Wt 179.0 lb

## 2018-06-29 DIAGNOSIS — B9789 Other viral agents as the cause of diseases classified elsewhere: Secondary | ICD-10-CM

## 2018-06-29 DIAGNOSIS — R062 Wheezing: Secondary | ICD-10-CM

## 2018-06-29 DIAGNOSIS — J069 Acute upper respiratory infection, unspecified: Secondary | ICD-10-CM

## 2018-06-29 MED ORDER — ALBUTEROL SULFATE HFA 108 (90 BASE) MCG/ACT IN AERS: 2.0000 | INHALATION_SPRAY | RESPIRATORY_TRACT | 1 refills | Status: DC | PRN

## 2018-06-29 MED ORDER — HYDROCODONE-HOMATROPINE 5-1.5 MG/5ML PO SYRP: 5.0000 mL | ORAL_SOLUTION | Freq: Three times a day (TID) | ORAL | 0 refills | Status: DC | PRN

## 2018-06-29 NOTE — Patient Instructions (Signed)
Good to see you today  You have a viral illness, could be mild case of flu  For nasal congestion you can use Afrin nasal spray for 3 days max, saline nasal spray (generic is fine for all). For cough, I have sent in a prescription cough syrup- may make you sleepy. For wheeze, I have sent in an inhaler, you can use every 4-6 hours as needed for cough/wheeze Drink enough fluids to make your urine light yellow. For fever/chill/muscle aches you can take over the counter acetaminophen or ibuprofen.  Please come back in if you are not better in 5-7 days or if you develop wheezing, shortness of breath or persistent vomiting.

## 2018-06-29 NOTE — Progress Notes (Signed)
Subjective:    Patient ID: Mary Schaefer, female    DOB: 1960/11/04, 58 y.o.   MRN: [id number]  HPI This is a 58 yo female who presents today with 4 days of fever. Highest fever 101.5. She has had cough x 4-5 days, chest congestion. Dry cough. Feels achy. Little nasal drainage. Headache. Some wheeze at night. Has used inhaler in past, does not have one currently. No SOB. Mucinex DM and tylenol/ibuprofen with some relief.  Sick contacts at work. Had flu shot this season.   Past Medical History:  Diagnosis Date  . Blood in stool   . Childhood asthma   . Endometriosis 1997  . Hypertension   . Thyroid disease    Past Surgical History:  Procedure Laterality Date  . TUBAL LIGATION  1998   Family History  Problem Relation Age of Onset  . Ovarian cancer Mother   . Hyperlipidemia Mother   . Alcohol abuse Father   . Hyperlipidemia Father   . Heart disease Father   . Stroke Father   . Hypertension Father    Social History   Tobacco Use  . Smoking status: Never Smoker  . Smokeless tobacco: Never Used  Substance Use Topics  . Alcohol use: Yes    Comment: occasional  . Drug use: Yes      Review of Systems Per HPI    Objective:   Physical Exam Vitals signs reviewed.  Constitutional:      General: She is not in acute distress.    Appearance: Normal appearance. She is ill-appearing. She is not toxic-appearing.  HENT:     Head: Atraumatic.     Right Ear: Tympanic membrane, ear canal and external ear normal.     Left Ear: Tympanic membrane, ear canal and external ear normal.     Nose: Congestion and rhinorrhea present.     Mouth/Throat:     Mouth: Mucous membranes are moist.     Pharynx: Oropharynx is clear.  Eyes:     Conjunctiva/sclera: Conjunctivae normal.  Neck:     Musculoskeletal: Normal range of motion and neck supple. No neck rigidity or muscular tenderness.  Cardiovascular:     Rate and Rhythm: Normal rate and regular rhythm.     Heart sounds: Normal heart  sounds.  Pulmonary:     Effort: Pulmonary effort is normal.     Breath sounds: Normal breath sounds.     Comments: Occasional dry cough.  Lymphadenopathy:     Cervical: No cervical adenopathy.  Skin:    General: Skin is warm and dry.  Neurological:     Mental Status: She is alert and oriented to person, place, and time.  Psychiatric:        Mood and Affect: Mood normal.        Behavior: Behavior normal.        Thought Content: Thought content normal.        Judgment: Judgment normal.       BP 128/86   Pulse 94   Temp 98 F (36.7 C) (Oral)   Ht 5\' 6"  (1.676 m)   Wt 179 lb (81.2 kg)   LMP 09/10/2016   SpO2 97%   BMI 28.89 kg/m      Assessment & Plan:  1. Viral URI with cough - Provided written and verbal information regarding diagnosis and treatment. -  Patient Instructions  Good to see you today  You have a viral illness, could be mild case of flu  For nasal congestion you can use Afrin nasal spray for 3 days max, saline nasal spray (generic is fine for all). For cough, I have sent in a prescription cough syrup- may make you sleepy. For wheeze, I have sent in an inhaler, you can use every 4-6 hours as needed for cough/wheeze Drink enough fluids to make your urine light yellow. For fever/chill/muscle aches you can take over the counter acetaminophen or ibuprofen.  Please come back in if you are not better in 5-7 days or if you develop wheezing, shortness of breath or persistent vomiting.    - HYDROcodone-homatropine (HYCODAN) 5-1.5 MG/5ML syrup; Take 5 mLs by mouth every 8 (eight) hours as needed for cough.  Dispense: 120 mL; Refill: 0  2. Wheeze - albuterol (PROVENTIL HFA;VENTOLIN HFA) 108 (90 Base) MCG/ACT inhaler; Inhale 2 puffs into the lungs every 4 (four) hours as needed for wheezing or shortness of breath (cough, shortness of breath or wheezing.).  Dispense: 1 Inhaler; Refill: [place], FNP-BC  [facility]  06/29/2018 10:32 AM

## 2018-08-12 ENCOUNTER — Other Ambulatory Visit: Payer: Self-pay | Admitting: Internal Medicine

## 2018-08-12 DIAGNOSIS — I10 Essential (primary) hypertension: Secondary | ICD-10-CM

## 2018-08-12 MED ORDER — LISINOPRIL-HYDROCHLOROTHIAZIDE 10-12.5 MG PO TABS: 1.0000 | ORAL_TABLET | Freq: Every day | ORAL | 0 refills | Status: DC

## 2018-08-18 ENCOUNTER — Other Ambulatory Visit: Payer: Self-pay | Admitting: Internal Medicine

## 2018-08-18 DIAGNOSIS — E039 Hypothyroidism, unspecified: Secondary | ICD-10-CM

## 2018-08-19 MED ORDER — LEVOTHYROXINE SODIUM 112 MCG PO TABS: 112.0000 ug | ORAL_TABLET | Freq: Every day | ORAL | 0 refills | Status: DC

## 2018-08-21 ENCOUNTER — Encounter: Payer: Self-pay | Admitting: Internal Medicine

## 2018-08-21 DIAGNOSIS — K602 Anal fissure, unspecified: Secondary | ICD-10-CM

## 2018-08-30 ENCOUNTER — Telehealth: Payer: Self-pay | Admitting: Gastroenterology

## 2018-08-30 NOTE — Telephone Encounter (Signed)
There is a referral for this pt for anal fissure.  Please see pt message regarding referral.  Pt has not seen GI doctor.  Dr. Fuller Plan DOD 3.20.20.  Please advise.

## 2018-08-30 NOTE — Telephone Encounter (Signed)
Pt scheduled for consult on 3/24 at 8:45 am with Dr. Fuller Plan.

## 2018-08-30 NOTE — Telephone Encounter (Signed)
Patient can schedule with APP or MD for next week

## 2018-09-03 ENCOUNTER — Ambulatory Visit: Payer: Managed Care, Other (non HMO) | Admitting: Gastroenterology

## 2018-10-11 ENCOUNTER — Ambulatory Visit: Payer: Managed Care, Other (non HMO) | Admitting: Gastroenterology

## 2018-10-13 ENCOUNTER — Other Ambulatory Visit: Payer: Self-pay | Admitting: Internal Medicine

## 2018-10-22 ENCOUNTER — Telehealth: Payer: Self-pay | Admitting: General Surgery

## 2018-10-22 ENCOUNTER — Encounter: Payer: Self-pay | Admitting: General Surgery

## 2018-10-22 NOTE — Progress Notes (Signed)
  Covid-19 travel screening questions   Have you traveled in the last 14 days?  If yes where? no  Do you now or have you had a fever in the last 14 days? no  Do you have any respiratory symptoms of shortness of breath or cough now or in the last 14 days? no  Do you have a medical history of Congestive Heart Failure?  No Do you have a medical history of lung disease?  no Do you have any family members or close contacts with diagnosed or suspected Covid-19?  no

## 2018-10-22 NOTE — Telephone Encounter (Signed)
Pt returned your call.  

## 2018-10-22 NOTE — Telephone Encounter (Signed)
Left voicemail for the patient to call to Covid screen for office visit.

## 2018-10-23 ENCOUNTER — Other Ambulatory Visit: Payer: Self-pay

## 2018-10-23 ENCOUNTER — Ambulatory Visit (INDEPENDENT_AMBULATORY_CARE_PROVIDER_SITE_OTHER): Payer: Managed Care, Other (non HMO) | Admitting: Gastroenterology

## 2018-10-23 ENCOUNTER — Encounter: Payer: Self-pay | Admitting: Gastroenterology

## 2018-10-23 VITALS — BP 146/84 | Temp 98.9°F | Ht 66.0 in | Wt 189.0 lb

## 2018-10-23 DIAGNOSIS — K921 Melena: Secondary | ICD-10-CM

## 2018-10-23 DIAGNOSIS — K648 Other hemorrhoids: Secondary | ICD-10-CM

## 2018-10-23 MED ORDER — DILTIAZEM GEL 2 %: 1.0000 "application " | Freq: Three times a day (TID) | CUTANEOUS | 0 refills | Status: DC

## 2018-10-23 NOTE — Progress Notes (Signed)
History of Present Illness: This is a 58 year old female referred by Jearld Fenton, NP for the evaluation of rectal bleeding and rectal pain.  She relates a history of recurrent anal fissures and internal hemorrhoids.  She was evaluated by Dr. Evalyn Casco, colorectal surgeon with [facility], and diagnosed with an anal fissure in November 2017 and treated through March 2018.  She used 2% diltiazem cream.  When symptoms recurred she was offered the option of Botox injections or sphincterotomy.  She states she saw a Radiographer, therapeutic in [place] several years before that and was treated with an in office injection therapy, probably for internal hemorrhoids.  She states she underwent colonoscopy at [facility] in November 2014 which showed only internal hemorrhoids.  She had the onset of a fair amount of red rectal bleeding with mild pain and her symptoms persisted for 5 days and then completely resolved.  She is currently asymptomatic.  She states the symptoms often recur when she has any straining. Denies weight loss, abdominal pain, constipation, diarrhea, change in stool caliber, melena, nausea, vomiting, dysphagia, reflux symptoms, chest pain.     Allergies  Allergen Reactions  . Aspirin Shortness Of Breath and Hives  . Penicillins Hives   Outpatient Medications Prior to Visit  Medication Sig Dispense Refill  . acetaminophen (TYLENOL) 500 MG tablet Take 1,000 mg by mouth as needed for mild pain.    . hydrOXYzine (ATARAX/VISTARIL) 25 MG tablet Take 1 tablet (25 mg total) by mouth 3 (three) times daily as needed. 30 tablet 2  . levothyroxine (SYNTHROID, LEVOTHROID) 112 MCG tablet Take 1 tablet (112 mcg total) by mouth daily. 90 tablet 0  . lisinopril-hydrochlorothiazide (PRINZIDE,ZESTORETIC) 10-12.5 MG tablet Take 1 tablet by mouth daily. MUST SCHEDULE ANNUAL PHYSICAL 90 tablet 0  . naproxen sodium (ALEVE) 220 MG tablet Take 440 mg by mouth as needed (pain).    . prednisoLONE acetate (PRED FORTE) 1 %  ophthalmic suspension INSTILL 1 DROP INTO LEFT EYE 4 TIMES A DAY    . ranitidine (ZANTAC) 150 MG tablet Take 150 mg by mouth as needed for heartburn.    . rosuvastatin (CRESTOR) 10 MG tablet TAKE 1 TABLET BY MOUTH EVERY DAY 90 tablet 0  . albuterol (PROVENTIL HFA;VENTOLIN HFA) 108 (90 Base) MCG/ACT inhaler Inhale 2 puffs into the lungs every 4 (four) hours as needed for wheezing or shortness of breath (cough, shortness of breath or wheezing.). (Patient not taking: Reported on 10/22/2018) 1 Inhaler 1  . clobetasol cream (TEMOVATE) 9.56 % Apply 1 application topically 2 (two) times daily. (Patient not taking: Reported on 10/22/2018) 30 g 0  . HYDROcodone-homatropine (HYCODAN) 5-1.5 MG/5ML syrup Take 5 mLs by mouth every 8 (eight) hours as needed for cough. (Patient not taking: Reported on 10/22/2018) 120 mL 0  . ketorolac (TORADOL) 10 MG tablet Take 1 tablet (10 mg total) by mouth every 8 (eight) hours as needed. (Patient not taking: Reported on 10/22/2018) 30 tablet 0  . LYRICA 75 MG capsule TAKE 1 CAP BY MOUTH 2 TIMES DAILY MAY INCREASE TO 150 MG TWICE DAILY WITHIN 1 WEEK  1  . meloxicam (MOBIC) 15 MG tablet Take 15 mg by mouth daily.     No facility-administered medications prior to visit.    Past Medical History:  Diagnosis Date  . Blood in stool   . Childhood asthma   . Endometriosis 1997  . Hypertension   . Thyroid disease    Past Surgical History:  Procedure Laterality Date  .  CORNEAL TRANSPLANT    . TUBAL LIGATION  1998   Social History   Socioeconomic History  . Marital status: Unknown    Spouse name: Not on file  . Number of children: Not on file  . Years of education: Not on file  . Highest education level: Not on file  Occupational History  . Not on file  Social Needs  . Financial resource strain: Not on file  . Food insecurity:    Worry: Not on file    Inability: Not on file  . Transportation needs:    Medical: Not on file    Non-medical: Not on file  Tobacco Use  .  Smoking status: Never Smoker  . Smokeless tobacco: Never Used  Substance and Sexual Activity  . Alcohol use: Yes    Comment: occasional  . Drug use: Yes  . Sexual activity: Yes  Lifestyle  . Physical activity:    Days per week: Not on file    Minutes per session: Not on file  . Stress: Not on file  Relationships  . Social connections:    Talks on phone: Not on file    Gets together: Not on file    Attends religious service: Not on file    Active member of club or organization: Not on file    Attends meetings of clubs or organizations: Not on file    Relationship status: Not on file  Other Topics Concern  . Not on file  Social History Narrative  . Not on file   Family History  Problem Relation Age of Onset  . Ovarian cancer Mother   . Hyperlipidemia Mother   . Alcohol abuse Father   . Hyperlipidemia Father   . Heart disease Father   . Stroke Father   . Hypertension Father        Review of Systems: Pertinent positive and negative review of systems were noted in the above HPI section. All other review of systems were otherwise negative.    Physical Exam: General: Well developed, well nourished, no acute distress Head: Normocephalic and atraumatic Eyes:  sclerae anicteric, EOMI Ears: Normal auditory acuity Mouth: No deformity or lesions Neck: Supple, no masses or thyromegaly Lungs: Clear throughout to auscultation Heart: Regular rate and rhythm; no murmurs, rubs or bruits Abdomen: Soft, non tender and non distended. No masses, hepatosplenomegaly or hernias noted. Normal Bowel sounds Rectal: No external abnormalities, no fissure, no anal canal tenderness, Hemoccult negative brown stool in the vault. Musculoskeletal: Symmetrical with no gross deformities  Skin: No lesions on visible extremities Pulses:  Normal pulses noted Extremities: No clubbing, cyanosis, edema or deformities noted Neurological: Alert oriented x 4, grossly nonfocal Cervical Nodes:  No  significant cervical adenopathy Inguinal Nodes: No significant inguinal adenopathy Psychological:  Alert and cooperative. Normal mood and affect  Anoscopy: Grade 1 internal hemorrhoids, 3 columns, no other abnormalities   Assessment and Recommendations:  1.  Internal hemorrhoids with bleeding.  Likely cause of her recent symptoms. Maintain a high-fiber diet with a daily fiber supplement and adequate water intake. Avoid straining.  Preparation H suppositories at bedtime for 5 days when symptoms flare.  We discussed the option of hemorrhoidal banding. She will consider this option and contact us if she wishes to proceed.  Request records from prior colonoscopy at [facility].  If she has persistent rectal bleeding recommend colonoscopy for further evaluation.  2.  History of recurrent anal fissure.  No fissure at today's exam.  Same dietary recommendations as  above.  Diltiazem 2% gel 3 times daily for 8 weeks if an anal fissure recurs.  If her fissure is recurrent and refractory to diltiazem consider colorectal surgical referral for possible Botox, possible sphincterotomy   cc: Jearld Fenton, NP [address] [place],  [id number]

## 2018-10-23 NOTE — Patient Instructions (Signed)
You can use over the counter preparation H suppositories daily x 5 days.   We have sent a prescription for diltiazem gel to [facility]. You should apply a pea size amount to your rectum three times daily x 6-8 weeks.  [facility]'s information is below: Address: [address], [place], [place] [address]  [phone]  *Please DO NOT go directly from our office to pick up this medication! Give the pharmacy 1 day to process the prescription as this is compounded at takes time to make.  Call back if your symptoms persist or if you want to schedule a hemorrhoid banding.   Thank you for choosing me and [facility].  Pricilla Riffle. Dagoberto Ligas., MD., Marval Regal

## 2018-11-18 ENCOUNTER — Other Ambulatory Visit: Payer: Self-pay | Admitting: Internal Medicine

## 2018-11-18 DIAGNOSIS — E039 Hypothyroidism, unspecified: Secondary | ICD-10-CM

## 2018-11-18 DIAGNOSIS — I10 Essential (primary) hypertension: Secondary | ICD-10-CM

## 2018-11-19 MED ORDER — LISINOPRIL-HYDROCHLOROTHIAZIDE 10-12.5 MG PO TABS: 1.0000 | ORAL_TABLET | Freq: Every day | ORAL | 0 refills | Status: DC

## 2018-12-24 ENCOUNTER — Encounter: Payer: Managed Care, Other (non HMO) | Admitting: Internal Medicine

## 2019-01-21 ENCOUNTER — Encounter: Payer: Managed Care, Other (non HMO) | Admitting: Internal Medicine

## 2019-02-04 ENCOUNTER — Encounter: Payer: Self-pay | Admitting: Internal Medicine

## 2019-02-04 ENCOUNTER — Telehealth: Payer: Self-pay | Admitting: Internal Medicine

## 2019-02-04 NOTE — Telephone Encounter (Signed)
I'm sorry, I didn't see this before I left this office. Can we see if she went to UC? And how she's doing?

## 2019-02-04 NOTE — Telephone Encounter (Signed)
Patient called stating she is throwing up and would like to know if there is anything you could send in for her before she decided to go to an urgent care tonight,   C/B # [phone]    [facility]

## 2019-02-05 NOTE — Telephone Encounter (Signed)
Noted, will follow results

## 2019-02-05 NOTE — Telephone Encounter (Signed)
Pt did go to UC yesterday was tested for Covid and was given Zofran for nausea.Marland KitchenMarland Kitchen

## 2019-02-09 ENCOUNTER — Other Ambulatory Visit: Payer: Self-pay | Admitting: Internal Medicine

## 2019-02-10 ENCOUNTER — Other Ambulatory Visit: Payer: Self-pay | Admitting: Internal Medicine

## 2019-02-10 DIAGNOSIS — I10 Essential (primary) hypertension: Secondary | ICD-10-CM

## 2019-02-12 ENCOUNTER — Ambulatory Visit (INDEPENDENT_AMBULATORY_CARE_PROVIDER_SITE_OTHER): Payer: Managed Care, Other (non HMO) | Admitting: Internal Medicine

## 2019-02-12 ENCOUNTER — Other Ambulatory Visit: Payer: Self-pay

## 2019-02-12 ENCOUNTER — Encounter: Payer: Self-pay | Admitting: Internal Medicine

## 2019-02-12 VITALS — BP 130/84 | HR 97 | Temp 98.7°F | Ht 66.0 in | Wt 186.0 lb

## 2019-02-12 DIAGNOSIS — E039 Hypothyroidism, unspecified: Secondary | ICD-10-CM | POA: Diagnosis not present

## 2019-02-12 DIAGNOSIS — E7800 Pure hypercholesterolemia, unspecified: Secondary | ICD-10-CM

## 2019-02-12 DIAGNOSIS — Z0000 Encounter for general adult medical examination without abnormal findings: Secondary | ICD-10-CM | POA: Diagnosis not present

## 2019-02-12 DIAGNOSIS — E1165 Type 2 diabetes mellitus with hyperglycemia: Secondary | ICD-10-CM

## 2019-02-12 DIAGNOSIS — Z23 Encounter for immunization: Secondary | ICD-10-CM

## 2019-02-12 DIAGNOSIS — R748 Abnormal levels of other serum enzymes: Secondary | ICD-10-CM

## 2019-02-12 DIAGNOSIS — N809 Endometriosis, unspecified: Secondary | ICD-10-CM | POA: Diagnosis not present

## 2019-02-12 DIAGNOSIS — I10 Essential (primary) hypertension: Secondary | ICD-10-CM | POA: Diagnosis not present

## 2019-02-12 DIAGNOSIS — E559 Vitamin D deficiency, unspecified: Secondary | ICD-10-CM

## 2019-02-12 DIAGNOSIS — F411 Generalized anxiety disorder: Secondary | ICD-10-CM

## 2019-02-12 DIAGNOSIS — E119 Type 2 diabetes mellitus without complications: Secondary | ICD-10-CM | POA: Insufficient documentation

## 2019-02-12 NOTE — Progress Notes (Signed)
Subjective:    Patient ID: Mary Schaefer, female    DOB: [birth year], 58 y.o.   MRN: [id number]  HPI  Pt presents to the clinic today for her annual exam. She is also due to follow up chronic conditions.  HTN: Her BP today is 130/84. She is taking Lisinopril HCT as prescribed. There is no ECG on file.  Hypothyroidism: She denies any issues on her current dose of Levothyroxine. She is due for repeat labs today.  GERD: Triggered by sparkling water. She is takes Tums as needed with good relief. There is no upper GI on file.   HLD: Her last LDL was 219, 05/2018. She was started on Rosuvastatin which she has been taking as prescribed. She denies myalgias. She never came back for her repeat labs. She is trying to consume a low fat diet.  GAD: Intermittent. Controlled with Hydroxyzine prn. She is not seeing a therapist. She denies depression, SI/HI.  DM 2: She reports this is a new diagnosis. She recently had labs for insurance. A1C was 10.1%. A1C 1 year ago was 5.1%. She had a history of gestational diabetes. She is not taking any diabetic medication at this time.  Flu: 02/2018 Tetanus: 01/2014 Pap Smear: 06/2018 Mammogram: 11/2017 Colon Screening: 04/2013 Vision Screening: annually Dentist: annually  Diet: She does eat meat. She consumes fruits and veggies daily. She does eat some fried foods. She drinks mostly water. Exercise: Walking.  Review of Systems      Past Medical History:  Diagnosis Date  . Blood in stool   . Childhood asthma   . Endometriosis 1997  . Hypertension   . Thyroid disease     Current Outpatient Medications  Medication Sig Dispense Refill  . acetaminophen (TYLENOL) 500 MG tablet Take 1,000 mg by mouth as needed for mild pain.    [name] diltiazem 2 % GEL Apply 1 application topically 3 (three) times daily. 30 g 0  . hydrOXYzine (ATARAX/VISTARIL) 25 MG tablet Take 1 tablet (25 mg total) by mouth 3 (three) times daily as needed. 30 tablet 2  . levothyroxine  (SYNTHROID) 112 MCG tablet TAKE 1 TABLET BY MOUTH EVERY DAY 90 tablet 0  . lisinopril-hydrochlorothiazide (ZESTORETIC) 10-12.5 MG tablet Take 1 tablet by mouth daily. MUST SCHEDULE ANNUAL PHYSICAL 90 tablet 0  . naproxen sodium (ALEVE) 220 MG tablet Take 440 mg by mouth as needed (pain).    . prednisoLONE acetate (PRED FORTE) 1 % ophthalmic suspension INSTILL 1 DROP INTO LEFT EYE 4 TIMES A DAY    . ranitidine (ZANTAC) 150 MG tablet Take 150 mg by mouth as needed for heartburn.    . rosuvastatin (CRESTOR) 10 MG tablet TAKE 1 TABLET BY MOUTH EVERY DAY 90 tablet 0   No current facility-administered medications for this visit.     Allergies  Allergen Reactions  . Aspirin Shortness Of Breath and Hives  . Penicillins Hives    Family History  Problem Relation Age of Onset  . Ovarian cancer Mother   . Hyperlipidemia Mother   . Alcohol abuse Father   . Hyperlipidemia Father   . Heart disease Father   . Stroke Father   . Hypertension Father     Social History   Socioeconomic History  . Marital status: Unknown    Spouse name: Not on file  . Number of children: Not on file  . Years of education: Not on file  . Highest education level: Not on file  Occupational History  . Not  on file  Social Needs  . Financial resource strain: Not on file  . Food insecurity    Worry: Not on file    Inability: Not on file  . Transportation needs    Medical: Not on file    Non-medical: Not on file  Tobacco Use  . Smoking status: Never Smoker  . Smokeless tobacco: Never Used  Substance and Sexual Activity  . Alcohol use: Yes    Comment: occasional  . Drug use: Yes  . Sexual activity: Yes  Lifestyle  . Physical activity    Days per week: Not on file    Minutes per session: Not on file  . Stress: Not on file  Relationships  . Social Herbalist on phone: Not on file    Gets together: Not on file    Attends religious service: Not on file    Active member of club or organization:  Not on file    Attends meetings of clubs or organizations: Not on file    Relationship status: Not on file  . Intimate partner violence    Fear of current or ex partner: Not on file    Emotionally abused: Not on file    Physically abused: Not on file    Forced sexual activity: Not on file  Other Topics Concern  . Not on file  Social History Narrative  . Not on file     Constitutional: Denies fever, malaise, fatigue, headache or abrupt weight changes.  HEENT: Pt reports decreased vision left eye (s/p corneal transplant). Denies eye pain, eye redness, ear pain, ringing in the ears, wax buildup, runny nose, nasal congestion, bloody nose, or sore throat. Respiratory: Denies difficulty breathing, shortness of breath, cough or sputum production.   Cardiovascular: Denies chest pain, chest tightness, palpitations or swelling in the hands or feet.  Gastrointestinal: Pt reports intermittent reflux. Denies abdominal pain, bloating, constipation, diarrhea or blood in the stool.  GU: Denies urgency, frequency, pain with urination, burning sensation, blood in urine, odor or discharge. Musculoskeletal: Denies decrease in range of motion, difficulty with gait, muscle pain or joint pain and swelling.  Skin: Denies redness, rashes, lesions or ulcercations.  Neurological: Pt reports burning sensation in feet. Denies dizziness, difficulty with memory, difficulty with speech or problems with balance and coordination.  Psych: Pt has a history of anxiety. Denies anxiety, depression, SI/HI.  No other specific complaints in a complete review of systems (except as listed in HPI above).  Objective:   Physical Exam  BP 130/84   Pulse 97   Temp 98.7 F (37.1 C) (Temporal)   Ht 5\' 6"  (1.676 m)   Wt 186 lb (84.4 kg)   LMP 09/10/2016   SpO2 99%   BMI 30.02 kg/m  Wt Readings from Last 3 Encounters:  02/12/19 186 lb (84.4 kg)  10/23/18 189 lb (85.7 kg)  06/29/18 179 lb (81.2 kg)    General: Appears her  stated age, well developed, well nourished in NAD. Skin: Warm, dry and intact. No ulcerations noted. HEENT: Head: normal shape and size; Eyes: sclera white, no icterus, conjunctiva pink, PERRLA and EOMs intact; Ears: Tm's gray and intact, normal light reflex;  Neck:  Neck supple, trachea midline. No masses, lumps or thyromegaly present.  Cardiovascular: Normal rate and rhythm. S1,S2 noted.  No murmur, rubs or gallops noted. No JVD or BLE edema. No carotid bruits noted. Pulmonary/Chest: Normal effort and positive vesicular breath sounds. No respiratory distress. No wheezes, rales or  ronchi noted.  Abdomen: Soft and nontender. Normal bowel sounds. No distention or masses noted. Liver, spleen and kidneys non palpable. Musculoskeletal:Strength 5/5 BUE/BLE. No difficulty with gait.  Neurological: Alert and oriented. Cranial nerves II-XII grossly intact. Coordination normal.  Psychiatric: Mood and affect normal. Behavior is normal. Judgment and thought content normal.     BMET    Component Value Date/Time   NA 137 05/24/2018 1609   K 3.8 05/24/2018 1609   CL 97 (L) 05/24/2018 1609   CO2 29 05/24/2018 1609   GLUCOSE 146 (H) 05/24/2018 1609   BUN 23 05/24/2018 1609   CREATININE 0.83 05/24/2018 1609   CALCIUM 10.6 (H) 05/24/2018 1609   GFRNONAA >60 09/17/2017 0732   GFRAA >60 09/17/2017 0732    Lipid Panel     Component Value Date/Time   CHOL 317 (H) 05/24/2018 1609   TRIG 308 (H) 05/24/2018 1609   HDL 46 (L) 05/24/2018 1609   CHOLHDL 6.9 (H) 05/24/2018 1609   VLDL 62.4 (H) 02/01/2018 0758   LDLCALC 219 (H) 05/24/2018 1609    CBC    Component Value Date/Time   WBC 6.0 09/17/2017 0732   RBC 4.85 09/17/2017 0732   HGB 14.4 09/17/2017 0732   HCT 44.0 09/17/2017 0732   PLT 225 09/17/2017 0732   MCV 90.7 09/17/2017 0732   MCH 29.7 09/17/2017 0732   MCHC 32.7 09/17/2017 0732   RDW 13.1 09/17/2017 0732    Hgb A1C Lab Results  Component Value Date   HGBA1C 5.1 09/07/2017            Assessment & Plan:   Preventative Health Maintenance:  Flu shot today Tetanus UTD Pap smear UTD Mammogram Colon screening Encouraged her to consume a balanced diet and exercise regimen Advised her to see an eye doctor and dentist annually Will check CBC, CMET, TSH, Free T4, Lipid, A1C, urine microalbumin and Vit D today  Elevated GGT:  Repeat GGT today  RTC in 1 year, sooner if needed Webb Silversmith, NP

## 2019-02-12 NOTE — Assessment & Plan Note (Signed)
Controlled on Lisinopril HCT CBC and CMET today Reinforced DASH diet and exercise for weight loss

## 2019-02-12 NOTE — Patient Instructions (Signed)

## 2019-02-12 NOTE — Assessment & Plan Note (Signed)
A1C and urine microalbumin today Encouraged her to consume a low carb diet and exercise for weight loss If A1C elevated, will start Metformin 1000 mg BID Encouraged routine eye exams Foot exam today Flu shot today Consider pneumovax at next visit

## 2019-02-12 NOTE — Assessment & Plan Note (Signed)
CMET and Lipid profile today Encouraged her to consume a low fat diet Continue Rosuvastatin for now 

## 2019-02-12 NOTE — Assessment & Plan Note (Signed)
TSH and Free T4 today Will adjust Levothyroxine if needed based on labs 

## 2019-02-12 NOTE — Assessment & Plan Note (Signed)
Controlled off meds  Will monitor 

## 2019-02-13 ENCOUNTER — Other Ambulatory Visit: Payer: Managed Care, Other (non HMO)

## 2019-02-13 DIAGNOSIS — R739 Hyperglycemia, unspecified: Secondary | ICD-10-CM

## 2019-02-13 LAB — LDL CHOLESTEROL, DIRECT: Direct LDL: 161 mg/dL

## 2019-02-13 LAB — COMPREHENSIVE METABOLIC PANEL
ALT: 58 U/L — ABNORMAL HIGH (ref 0–35)
AST: 49 U/L — ABNORMAL HIGH (ref 0–37)
Albumin: 4.5 g/dL (ref 3.5–5.2)
Alkaline Phosphatase: 87 U/L (ref 39–117)
BUN: 18 mg/dL (ref 6–23)
CO2: 27 mEq/L (ref 19–32)
Calcium: 9.4 mg/dL (ref 8.4–10.5)
Chloride: 95 mEq/L — ABNORMAL LOW (ref 96–112)
Creatinine, Ser: 0.75 mg/dL (ref 0.40–1.20)
GFR: 79.61 mL/min (ref >60.00)
Glucose, Bld: 190 mg/dL — ABNORMAL HIGH (ref 70–99)
Potassium: 3.8 mEq/L (ref 3.5–5.1)
Sodium: 135 mEq/L (ref 135–145)
Total Bilirubin: 0.6 mg/dL (ref 0.2–1.2)
Total Protein: 7.1 g/dL (ref 6.0–8.3)

## 2019-02-13 LAB — TSH: TSH: 1.96 u[IU]/mL (ref 0.35–4.50)

## 2019-02-13 LAB — MICROALBUMIN / CREATININE URINE RATIO
Creatinine,U: 75.8 mg/dL
Microalb Creat Ratio: 1.1 mg/g (ref 0.0–30.0)
Microalb, Ur: 0.8 mg/dL (ref 0.0–1.9)

## 2019-02-13 LAB — LIPID PANEL
Cholesterol: 265 mg/dL — ABNORMAL HIGH (ref 0–200)
HDL: 33 mg/dL — ABNORMAL LOW (ref >39.00)
Total CHOL/HDL Ratio: 8
Triglycerides: 646 mg/dL — ABNORMAL HIGH (ref 0.0–149.0)

## 2019-02-13 LAB — CBC
HCT: 45.4 % (ref 36.0–46.0)
Hemoglobin: 15.2 g/dL — ABNORMAL HIGH (ref 12.0–15.0)
MCHC: 33.6 g/dL (ref 30.0–36.0)
MCV: 89.6 fl (ref 78.0–100.0)
Platelets: 277 10*3/uL (ref 150.0–400.0)
RBC: 5.07 Mil/uL (ref 3.87–5.11)
RDW: 13.3 % (ref 11.5–15.5)
WBC: 5.8 10*3/uL (ref 4.0–10.5)

## 2019-02-13 LAB — T4, FREE: Free T4: 1.29 ng/dL (ref 0.60–1.60)

## 2019-02-13 LAB — VITAMIN D 25 HYDROXY (VIT D DEFICIENCY, FRACTURES): VITD: 23.57 ng/mL — ABNORMAL LOW (ref 30.00–100.00)

## 2019-02-13 LAB — GAMMA GT: GGT: 392 U/L — ABNORMAL HIGH (ref 7–51)

## 2019-02-14 ENCOUNTER — Other Ambulatory Visit: Payer: Self-pay | Admitting: Internal Medicine

## 2019-02-14 ENCOUNTER — Encounter: Payer: Self-pay | Admitting: Internal Medicine

## 2019-02-14 LAB — HEMOGLOBIN A1C
Hgb A1c MFr Bld: 10.7 % of total Hgb — ABNORMAL HIGH (ref <5.7)
Mean Plasma Glucose: 260 (calc)
eAG (mmol/L): 14.4 (calc)

## 2019-02-14 MED ORDER — METFORMIN HCL 1000 MG PO TABS: ORAL_TABLET | ORAL | 2 refills | Status: DC

## 2019-02-14 NOTE — Progress Notes (Signed)
m °

## 2019-02-18 ENCOUNTER — Other Ambulatory Visit: Payer: Self-pay | Admitting: Internal Medicine

## 2019-02-18 ENCOUNTER — Encounter: Payer: Self-pay | Admitting: Internal Medicine

## 2019-02-18 DIAGNOSIS — E039 Hypothyroidism, unspecified: Secondary | ICD-10-CM

## 2019-02-18 DIAGNOSIS — I10 Essential (primary) hypertension: Secondary | ICD-10-CM

## 2019-02-18 MED ORDER — HYDROXYZINE HCL 25 MG PO TABS: 25.0000 mg | ORAL_TABLET | Freq: Three times a day (TID) | ORAL | 2 refills | Status: DC | PRN

## 2019-02-18 MED ORDER — LEVOTHYROXINE SODIUM 112 MCG PO TABS: 112.0000 ug | ORAL_TABLET | Freq: Every day | ORAL | 2 refills | Status: DC

## 2019-02-18 MED ORDER — LISINOPRIL-HYDROCHLOROTHIAZIDE 10-12.5 MG PO TABS: 1.0000 | ORAL_TABLET | Freq: Every day | ORAL | 2 refills | Status: DC

## 2019-02-18 NOTE — Telephone Encounter (Signed)
Last filled 03/2018... please advise

## 2019-02-19 ENCOUNTER — Encounter: Payer: Self-pay | Admitting: Internal Medicine

## 2019-02-19 DIAGNOSIS — E7800 Pure hypercholesterolemia, unspecified: Secondary | ICD-10-CM

## 2019-02-19 DIAGNOSIS — E1165 Type 2 diabetes mellitus with hyperglycemia: Secondary | ICD-10-CM

## 2019-02-20 ENCOUNTER — Other Ambulatory Visit: Payer: Self-pay

## 2019-02-20 ENCOUNTER — Ambulatory Visit (INDEPENDENT_AMBULATORY_CARE_PROVIDER_SITE_OTHER): Payer: Managed Care, Other (non HMO) | Admitting: Internal Medicine

## 2019-02-20 ENCOUNTER — Encounter: Payer: Self-pay | Admitting: Internal Medicine

## 2019-02-20 VITALS — BP 128/84 | HR 84 | Temp 98.2°F | Wt 183.0 lb

## 2019-02-20 DIAGNOSIS — R1011 Right upper quadrant pain: Secondary | ICD-10-CM | POA: Diagnosis not present

## 2019-02-20 DIAGNOSIS — E1165 Type 2 diabetes mellitus with hyperglycemia: Secondary | ICD-10-CM

## 2019-02-20 DIAGNOSIS — Z23 Encounter for immunization: Secondary | ICD-10-CM | POA: Diagnosis not present

## 2019-02-20 MED ORDER — VITAMIN D (ERGOCALCIFEROL) 1.25 MG (50000 UNIT) PO CAPS: 50000.0000 [IU] | ORAL_CAPSULE | ORAL | 0 refills | Status: DC

## 2019-02-20 NOTE — Patient Instructions (Signed)

## 2019-02-21 ENCOUNTER — Encounter: Payer: Self-pay | Admitting: *Deleted

## 2019-02-21 ENCOUNTER — Encounter: Payer: Managed Care, Other (non HMO) | Attending: Internal Medicine | Admitting: *Deleted

## 2019-02-21 ENCOUNTER — Encounter: Payer: Self-pay | Admitting: Internal Medicine

## 2019-02-21 VITALS — BP 122/86 | Ht 66.0 in | Wt 183.3 lb

## 2019-02-21 DIAGNOSIS — E1165 Type 2 diabetes mellitus with hyperglycemia: Secondary | ICD-10-CM | POA: Diagnosis not present

## 2019-02-21 DIAGNOSIS — E7800 Pure hypercholesterolemia, unspecified: Secondary | ICD-10-CM | POA: Insufficient documentation

## 2019-02-21 MED ORDER — GLUCOSE BLOOD VI STRP: ORAL_STRIP | 6 refills | Status: DC

## 2019-02-21 NOTE — Progress Notes (Signed)
Diabetes Self-Management Education  Visit Type: First/Initial  Appt. Start Time: 1310 Appt. End Time: 1400  02/21/2019  [name], identified by name and date of birth, is a 58 y.o. female with a diagnosis of Diabetes: Type 2.   ASSESSMENT  Blood pressure 122/86, height 5\' 6"  (1.676 m), weight 183 lb 4.8 oz (83.1 kg), last menstrual period 09/10/2016. Body mass index is 29.59 kg/m.  Diabetes Self-Management Education - 02/21/19 1551      Visit Information   Visit Type  First/Initial      Initial Visit   Diabetes Type  Type 2    Are you currently following a meal plan?  Yes    What type of meal plan do you follow?  "low carb, protein"    Are you taking your medications as prescribed?  Yes    Date Diagnosed  August 2020      Health Coping   How would you rate your overall health?  Good      Psychosocial Assessment   Patient Belief/Attitude about Diabetes  Motivated to manage diabetes   "worried about long term effects"   Self-care barriers  None    Self-management support  Doctor's office;Family    Patient Concerns  Nutrition/Meal planning;Glycemic Control;Weight Control;Healthy Lifestyle    Special Needs  None    Preferred Learning Style  Auditory;Visual    Learning Readiness  Change in progress    How often do you need to have someone help you when you read instructions, pamphlets, or other written materials from your doctor or pharmacy?  1 - Never    What is the last grade level you completed in school?  college      Pre-Education Assessment   Patient understands the diabetes disease and treatment process.  Needs Review    Patient understands incorporating nutritional management into lifestyle.  Needs Instruction    Patient undertands incorporating physical activity into lifestyle.  Needs Review    Patient understands using medications safely.  Needs Instruction    Patient understands monitoring blood glucose, interpreting and using results  Needs Review     Patient understands prevention, detection, and treatment of acute complications.  Needs Instruction    Patient understands prevention, detection, and treatment of chronic complications.  Needs Instruction    Patient understands how to develop strategies to address psychosocial issues.  Needs Instruction    Patient understands how to develop strategies to promote health/change behavior.  Needs Instruction      Complications   Last HgB A1C per patient/outside source  10.7 %   02/13/2019   How often do you check your blood sugar?  3-4 times/day   Provided new meter per her request. Instructed on use of One Touch Verio Flex.   Fasting Blood glucose range (mg/dL)  130-179   pt reports FBG's 150's mg/dL   Postprandial Blood glucose range (mg/dL)  130-179;180-200;>200   pt reports 160-220 mg/dL at random times   Have you had a dilated eye exam in the past 12 months?  Yes    Have you had a dental exam in the past 12 months?  No    Are you checking your feet?  Yes    How many days per week are you checking your feet?  7      Dietary Intake   Breakfast  Greek vanilla yogurt with few blueberries; [place] bacon or sausage with egg    Snack (morning)  cheese or nuts    Lunch  left-overs from supper    Snack (afternoon)  cheese, yogurt, nuts    Dinner  chicken, green beans, zucchini lasagna with [place], cheese, tomatoes; cabbage, avacodos, hummus; salads with tomatoes, cuccumbers    Snack (evening)  sf jello    Beverage(s)  water      Exercise   Exercise Type  Light (walking / raking leaves)    How many days per week to you exercise?  6    How many minutes per day do you exercise?  30    Total minutes per week of exercise  180      Patient Education   Previous Diabetes Education  Yes (please comment)   gestational diabetes   Disease state   Definition of diabetes, type 1 and 2, and the diagnosis of diabetes;Factors that contribute to the development of diabetes    Nutrition management   Role of  diet in the treatment of diabetes and the relationship between the three main macronutrients and blood glucose level;Food label reading, portion sizes and measuring food.;Carbohydrate counting;Reviewed blood glucose goals for pre and post meals and how to evaluate the patients' food intake on their blood glucose level.    Physical activity and exercise   Role of exercise on diabetes management, blood pressure control and cardiac health.    Medications  Reviewed patients medication for diabetes, action, purpose, timing of dose and side effects.    Monitoring  Taught/evaluated SMBG meter.;Purpose and frequency of SMBG.;Taught/discussed recording of test results and interpretation of SMBG.;Identified appropriate SMBG and/or A1C goals.    Chronic complications  Relationship between chronic complications and blood glucose control    Psychosocial adjustment  Role of stress on diabetes;Identified and addressed patients feelings and concerns about diabetes      Individualized Goals (developed by patient)   Reducing Risk  Improve blood sugars Lose weight Lead a healthier lifestyle Become more fit     Outcomes   Expected Outcomes  Demonstrated interest in learning. Expect positive outcomes       Individualized Plan for Diabetes Self-Management Training:   Learning Objective:  Patient will have a greater understanding of diabetes self-management. Patient education plan is to attend individual and/or group sessions per assessed needs and concerns.   Plan:   Patient Instructions  Check blood sugars 2 x day before breakfast and 2 hrs after supper every day Bring blood sugar records to the next class Call your doctor for a prescription for:  1. Meter strips (type) One Touch Verio   checking 2  times per day  2. Lancets (type) One Touch Delica  checking 2  times per day Exercise: Continue walking for  30  minutes   5-6 days a week Eat 3 meals day,  2-3  snacks a day Space meals 4-6 hours  apart Allow 2-3 hours between meals and snacks  Expected Outcomes:  Demonstrated interest in learning. Expect positive outcomes  Education material provided:  General Meal Planning Guidelines Simple Meal Plan Meter = One Touch Verio Flex  If problems or questions, patient to contact team via:  Johny Drilling, [place], [place], CDE [phone]  Future DSME appointment:  February 27, 2019 for Diabetes Class 1

## 2019-02-21 NOTE — Patient Instructions (Addendum)
Check blood sugars 2 x day before breakfast and 2 hrs after supper every day Bring blood sugar records to the next class  Call your doctor for a prescription for:  1. Meter strips (type) One Touch Verio   checking 2  times per day  2. Lancets (type) One Touch Delica  checking 2  times per day  Exercise: Continue walking for  30  minutes   5-6 days a week  Eat 3 meals day,  2-3  snacks a day Space meals 4-6 hours apart Allow 2-3 hours between meals and snacks  Return for classes on:

## 2019-02-26 ENCOUNTER — Telehealth: Payer: Self-pay | Admitting: Internal Medicine

## 2019-02-26 ENCOUNTER — Telehealth: Payer: Self-pay

## 2019-02-26 DIAGNOSIS — R1011 Right upper quadrant pain: Secondary | ICD-10-CM

## 2019-02-26 NOTE — Telephone Encounter (Signed)
Pt is agreeable to have [place]

## 2019-02-26 NOTE — Telephone Encounter (Signed)
Advised pt insurance has denied authorization for her CT Abdomen&Pelvis.  Will await next step from PCP.

## 2019-02-26 NOTE — Telephone Encounter (Signed)
-----   Message from Jearld Fenton, NP sent at 02/26/2019 12:45 PM EDT ----- Regarding: FW: Denied CT Abd/Pelvis Call pt. CT abdomen denied. They want to get an ultrasound first. Is she agreeable? ----- Message ----- From: Lillia Pauls Sent: 02/25/2019   9:56 AM EDT To: Jearld Fenton, NP Subject: Denied CT Abd/Pelvis                           Regina  Pt's case was denied authorization.  Please see denial rationalization below.  "Based on [facility] Abdomen Imaging Guidelines Section: AB 2.3 Right Upper Quadrant Pain including Suspected Gallbladder Disease, we cannot approve this request. Your records show that you have pain in the right upper region of your belly (abdomen). The reason this request cannot be approved is because: 1. Guidelines support a recent ultrasound for the clinical indication(s) presented. Ultrasound may help confirm the diagnosis or may help determine the most appropriate next imaging test. The requested procedure might be indicated when recent ultrasounds have been performed that are technically limited or non-diagnostic. The clinical information provided does not describe these results and, therefore, the request is not indicated at this time."  Please advise.  Thanks  Charmaine

## 2019-02-26 NOTE — Telephone Encounter (Signed)
There is another phone note regarding this. CMA to call and see if pt agreeable to ultrasound.

## 2019-02-27 ENCOUNTER — Encounter: Payer: Managed Care, Other (non HMO) | Admitting: Dietician

## 2019-02-27 ENCOUNTER — Encounter: Payer: Self-pay | Admitting: Dietician

## 2019-02-27 ENCOUNTER — Other Ambulatory Visit: Payer: Self-pay

## 2019-02-27 VITALS — Ht 66.0 in | Wt 181.3 lb

## 2019-02-27 DIAGNOSIS — E1165 Type 2 diabetes mellitus with hyperglycemia: Secondary | ICD-10-CM

## 2019-02-27 NOTE — Addendum Note (Signed)
Addended by: Jearld Fenton on: 02/27/2019 11:06 AM   Modules accepted: Orders

## 2019-02-27 NOTE — Progress Notes (Signed)

## 2019-02-28 ENCOUNTER — Encounter: Payer: Self-pay | Admitting: Internal Medicine

## 2019-02-28 NOTE — Assessment & Plan Note (Signed)
New onset Discussed diabetes and standards of medical care Encouraged low carb diet, exercise for weight loss On Metformin 1000 mg BID Continue to monitor sugars and let me know if consistently > 200 Foot exam today Encouraged routine eye exam Flu shot UTD Pneumovax today Referral to diabetes education and nutrition

## 2019-02-28 NOTE — Progress Notes (Signed)
Subjective:    Patient ID: Mary Schaefer, female    DOB: [birth year], 58 y.o.   MRN: [id number]  HPI  Pt presents to the clinic today to discuss new onset DM 2. Her most recent A1C was 10.7 up from 5.1. She reports her sugars are ranging 200-300. She had gestational diabetes but has never been told that she has diabetes. She has a family history of diabetes. Since diagnosis, she has been on a Keto diet. She has already lost 5 lbs. 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 She denies increased thirst, urinary frequency, nausea, vomiting, bloating, or non healing wounds. She does have some neuropathic pain in her hands and feet but does not feel like she needs medications to control this.   She does report intermittent RUQ pain. This comes and goes. She describes the pain as pressure. She reports associated nausea but denies vomiting, constipation, diarrhea or blood in her stool. She is concerned as she used to be on oral Voriconazole for a long time.  Review of Systems      Past Medical History:  Diagnosis Date  . Blood in stool   . Childhood asthma   . Diabetes mellitus without complication ([place])   . Endometriosis 1997  . Hypertension   . Thyroid disease     Current Outpatient Medications  Medication Sig Dispense Refill  . acetaminophen (TYLENOL) 500 MG tablet Take 1,000 mg by mouth as needed for mild pain.    . hydrOXYzine (ATARAX/VISTARIL) 25 MG tablet Take 1 tablet (25 mg total) by mouth 3 (three) times daily as needed. 30 tablet 2  . levothyroxine (SYNTHROID) 112 MCG tablet Take 1 tablet (112 mcg total) by mouth daily. 90 tablet 2  . lisinopril-hydrochlorothiazide (ZESTORETIC) 10-12.5 MG tablet Take 1 tablet by mouth daily. 90 tablet 2  . metFORMIN (GLUCOPHAGE) 1000 MG tablet Take 0.5 tablets (500 mg total) by mouth daily with breakfast for 7 days, THEN 0.5 tablets (500 mg total) 2 (two) times daily with a meal for 7 days, THEN 1 tablet (1,000  mg total) 2 (two) times daily with a meal. 60 tablet 2  . naproxen sodium (ALEVE) 220 MG tablet Take 440 mg by mouth as needed (pain).    . prednisoLONE acetate (PRED FORTE) 1 % ophthalmic suspension INSTILL 1 DROP INTO LEFT EYE 4 TIMES A DAY    . rosuvastatin (CRESTOR) 10 MG tablet TAKE 1 TABLET BY MOUTH EVERY DAY (Patient not taking: Reported on 02/21/2019) 90 tablet 0  . Ascorbic Acid (VITAMIN C PO) Take 1 tablet by mouth daily.    . cyanocobalamin 2000 MCG tablet Take 2,000 mcg by mouth 2 (two) times daily.    [name] glucose blood test strip Check sugar BID as needed. DX E11.9 100 each 6  . ofloxacin (OCUFLOX) 0.3 % ophthalmic solution Apply 1 drop to eye daily.    . Omega-3 Fatty Acids (FISH OIL) 1200 MG CAPS Take 1 capsule by mouth 3 (three) times daily.    . Vitamin D, Ergocalciferol, (DRISDOL) 1.25 MG (50000 UT) CAPS capsule Take 1 capsule (50,000 Units total) by mouth every 7 (seven) days. 12 capsule 0   No current facility-administered medications for this visit.     Allergies  Allergen Reactions  . Aspirin Shortness Of Breath and Hives  . Penicillins Hives    Family History  Problem Relation Age of Onset  . Ovarian cancer Mother   . Hyperlipidemia Mother   .  Alcohol abuse Father   . Hyperlipidemia Father   . Heart disease Father   . Stroke Father   . Hypertension Father   . Diabetes Sister     Social History   Socioeconomic History  . Marital status: Unknown    Spouse name: Not on file  . Number of children: Not on file  . Years of education: Not on file  . Highest education level: Not on file  Occupational History  . Not on file  Social Needs  . Financial resource strain: Not on file  . Food insecurity    Worry: Not on file    Inability: Not on file  . Transportation needs    Medical: Not on file    Non-medical: Not on file  Tobacco Use  . Smoking status: Never Smoker  . Smokeless tobacco: Never Used  Substance and Sexual Activity  . Alcohol use: Not  Currently    Alcohol/week: 1.0 - 2.0 standard drinks    Types: 1 - 2 Glasses of wine per week    Comment: occasional  . Drug use: Yes  . Sexual activity: Yes  Lifestyle  . Physical activity    Days per week: Not on file    Minutes per session: Not on file  . Stress: Not on file  Relationships  . Social Herbalist on phone: Not on file    Gets together: Not on file    Attends religious service: Not on file    Active member of club or organization: Not on file    Attends meetings of clubs or organizations: Not on file    Relationship status: Not on file  . Intimate partner violence    Fear of current or ex partner: Not on file    Emotionally abused: Not on file    Physically abused: Not on file    Forced sexual activity: Not on file  Other Topics Concern  . Not on file  Social History Narrative  . Not on file     Constitutional: Denies fever, malaise, fatigue, headache or abrupt weight changes.  Respiratory: Denies difficulty breathing, shortness of breath, cough or sputum production.   Cardiovascular: Denies chest pain, chest tightness, palpitations or swelling in the hands or feet.  Gastrointestinal: Denies abdominal pain, bloating, constipation, diarrhea or blood in the stool.  Skin: Denies redness, rashes, lesions or ulcercations.  Neurological: Pt reports neuropathic pain of feet. Denies dizziness, difficulty with memory, difficulty with speech or problems with balance and coordination.   No other specific complaints in a complete review of systems (except as listed in HPI above).  Objective:   Physical Exam  BP 128/84   Pulse 84   Temp 98.2 F (36.8 C) (Temporal)   Wt 183 lb (83 kg)   LMP 09/10/2016   SpO2 98%   BMI 29.54 kg/m  Wt Readings from Last 3 Encounters:  02/27/19 181 lb 4.8 oz (82.2 kg)  02/21/19 183 lb 4.8 oz (83.1 kg)  02/20/19 183 lb (83 kg)    General: Appears her stated age, well developed, well nourished in NAD. Skin: Warm, dry  and intact. No  ulcerations noted. Cardiovascular: Normal rate and rhythm.  Pulmonary/Chest: Normal effort and positive vesicular breath sounds. No respiratory distress. No wheezes, rales or ronchi noted.  Abdomen: Soft and nontender. Normal bowel sounds.  Neurological: Alert and oriented. Sensation intact to BLE.   BMET    Component Value Date/Time   NA 135 02/12/2019 1508  K 3.8 02/12/2019 1508   CL 95 (L) 02/12/2019 1508   CO2 27 02/12/2019 1508   GLUCOSE 190 (H) 02/12/2019 1508   BUN 18 02/12/2019 1508   CREATININE 0.75 02/12/2019 1508   CREATININE 0.83 05/24/2018 1609   CALCIUM 9.4 02/12/2019 1508   GFRNONAA >60 09/17/2017 0732   GFRAA >60 09/17/2017 0732    Lipid Panel     Component Value Date/Time   CHOL 265 (H) 02/12/2019 1508   TRIG (H) 02/12/2019 1508    646.0 Triglyceride is over 400; calculations on Lipids are invalid.   HDL 33.00 (L) 02/12/2019 1508   CHOLHDL 8 02/12/2019 1508   VLDL 62.4 (H) 02/01/2018 0758   LDLCALC 219 (H) 05/24/2018 1609    CBC    Component Value Date/Time   WBC 5.8 02/12/2019 1508   RBC 5.07 02/12/2019 1508   HGB 15.2 (H) 02/12/2019 1508   HCT 45.4 02/12/2019 1508   PLT 277.0 02/12/2019 1508   MCV 89.6 02/12/2019 1508   MCH 29.7 09/17/2017 0732   MCHC 33.6 02/12/2019 1508   RDW 13.3 02/12/2019 1508    Hgb A1C Lab Results  Component Value Date   HGBA1C 10.7 (H) 02/13/2019            Assessment & Plan:   RUQ Pain:  Will obtain abdominal ultrasound for further evaluation CT abd/pelvis was denies by insurance  RTC in 3 months, follow up DM 2 Webb Silversmith, NP

## 2019-03-06 ENCOUNTER — Other Ambulatory Visit: Payer: Self-pay

## 2019-03-06 ENCOUNTER — Encounter: Payer: Self-pay | Admitting: *Deleted

## 2019-03-06 ENCOUNTER — Encounter: Payer: Managed Care, Other (non HMO) | Admitting: *Deleted

## 2019-03-06 VITALS — Wt 178.5 lb

## 2019-03-06 DIAGNOSIS — E1165 Type 2 diabetes mellitus with hyperglycemia: Secondary | ICD-10-CM | POA: Diagnosis not present

## 2019-03-06 DIAGNOSIS — E119 Type 2 diabetes mellitus without complications: Secondary | ICD-10-CM

## 2019-03-06 NOTE — Progress Notes (Signed)
Appt. Start Time: 0900 Appt. End Time: 1100  Patient had to leave early due to work.   Class 2 Nutritional Management - identify sources of carbohydrate, protein and fat; plan balanced meals; estimate servings of carbohydrates in meals  Psychosocial - identify DM as a source of stress; state the effects of stress on BG control  Exercise - describe the effects of exercise on blood glucose and importance of regular exercise in controlling diabetes; state a plan for personal exercise; verbalize contraindications for exercise  Self-Monitoring - state importance of SMBG; use SMBG results to effectively manage diabetes; identify importance of regular HbA1C testing and goals for results  Acute Complications - recognize hyperglycemia and hypoglycemia with causes and effects; identify blood glucose results as high, low or in control; list steps in treating and preventing high and low blood glucose  Sick Day Guidelines: state appropriate measure to manage blood glucose when ill (need for meds, HBGM plan, when to call physician, need for fluids)  Chronic Complications/Foot, Skin, Eye Dental Care - identify possible long-term complications of diabetes (retinopathy, neuropathy, nephropathy, cardiovascular disease, infections); explain steps in prevention and treatment of chronic complications; state importance of daily self-foot exams; describe how to examine feet and what to look for; explain appropriate eye and dental care  Lifestyle Changes/Goals - state benefits of making appropriate lifestyle changes; identify habits that need to change (meals, tobacco, alcohol); identify strategies to reduce risk factors for personal health  Pregnancy/Sexual Health - state importance of good blood glucose control in preventing sexual problems (impotence, vaginal dryness, infections, loss of desire)  Teaching Materials Used: Class 2 Slide Packet A1C Pamphlet Foot Care Literature Kidney Test Handout Stroke  Card Quick and "Balanced" Meal Ideas Carb Counting and Meal Planning Book Goals for Class 2

## 2019-03-07 ENCOUNTER — Ambulatory Visit
Admission: RE | Admit: 2019-03-07 | Discharge: 2019-03-07 | Disposition: A | Discharge status: Home / Self Care | Payer: Managed Care, Other (non HMO) | Source: Ambulatory Visit | Attending: Internal Medicine | Admitting: Internal Medicine

## 2019-03-07 DIAGNOSIS — R1011 Right upper quadrant pain: Secondary | ICD-10-CM

## 2019-03-13 ENCOUNTER — Other Ambulatory Visit: Payer: Self-pay

## 2019-03-13 ENCOUNTER — Telehealth: Payer: Managed Care, Other (non HMO) | Admitting: Emergency Medicine

## 2019-03-13 ENCOUNTER — Encounter: Payer: Self-pay | Admitting: Internal Medicine

## 2019-03-13 ENCOUNTER — Telehealth: Payer: Self-pay | Admitting: *Deleted

## 2019-03-13 DIAGNOSIS — J069 Acute upper respiratory infection, unspecified: Secondary | ICD-10-CM

## 2019-03-13 DIAGNOSIS — Z20822 Contact with and (suspected) exposure to covid-19: Secondary | ICD-10-CM

## 2019-03-13 NOTE — Telephone Encounter (Signed)
Received voice mail from patient regarding today's Diabetes class. Called patient back and she reports that a family member is having COVID symptoms and she will be tested today. She will call back later to reschedule Class 3.

## 2019-03-13 NOTE — Progress Notes (Signed)
E-Visit for Corona Virus Screening   Your current symptoms could be consistent with the coronavirus.  Many health care providers can now test patients at their office but not all are.  [facility] has multiple testing sites. For information on our COVID testing locations and hours go to [email or web address]  Please quarantine yourself while awaiting your test results.  We are enrolling you in our [place] for Reader . Daily you will receive a questionnaire within the [facility] website. Our COVID 19 response team willl be monitoriing your responses daily.    COVID-19 is a respiratory illness with symptoms that are similar to the flu. Symptoms are typically mild to moderate, but there have been cases of severe illness and death due to the virus. The following symptoms may appear 2-14 days after exposure: . Fever . Cough . Shortness of breath or difficulty breathing . Chills . Repeated shaking with chills . Muscle pain . Headache . Sore throat . New loss of taste or smell . Fatigue . Congestion or runny nose . Nausea or vomiting . Diarrhea  It is vitally important that if you feel that you have an infection such as this virus or any other virus that you stay home and away from places where you may spread it to others.  You should self-quarantine for 14 days if you have symptoms that could potentially be coronavirus or have been in close contact a with a person diagnosed with COVID-19 within the last 2 weeks. You should avoid contact with people age 34 and older.   You should wear a mask or cloth face covering over your nose and mouth if you must be around other people or animals, including pets (even at home). Try to stay at least 6 feet away from other people. This will protect the people around you.  You can use medication such as Nyquil or Dayquil available over the counter.  You may also take acetaminophen (Tylenol) as needed for  fever.   Reduce your risk of any infection by using the same precautions used for avoiding the common cold or flu:  [name] Wash your hands often with soap and warm water for at least 20 seconds.  If soap and water are not readily available, use an alcohol-based hand sanitizer with at least 60% alcohol.  . If coughing or sneezing, cover your mouth and nose by coughing or sneezing into the elbow areas of your shirt or coat, into a tissue or into your sleeve (not your hands). . Avoid shaking hands with others and consider head nods or verbal greetings only. . Avoid touching your eyes, nose, or mouth with unwashed hands.  . Avoid close contact with people who are sick. . Avoid places or events with large numbers of people in one location, like concerts or sporting events. . Carefully consider travel plans you have or are making. . If you are planning any travel outside or inside the [place], visit the CDC's Travelers' Health webpage for the latest health notices. . If you have some symptoms but not all symptoms, continue to monitor at home and seek medical attention if your symptoms worsen. . If you are having a medical emergency, call 911.  HOME CARE . Only take medications as instructed by your medical team. . Drink plenty of fluids and get plenty of rest. . A steam or ultrasonic humidifier can help if you have congestion.   GET HELP RIGHT AWAY IF YOU HAVE EMERGENCY WARNING SIGNS** FOR COVID-19. If you  or someone is showing any of these signs seek emergency medical care immediately. Call 911 or proceed to your closest emergency facility if: . You develop worsening high fever. . Trouble breathing . Bluish lips or face . Persistent pain or pressure in the chest . New confusion . Inability to wake or stay awake . You cough up blood. . Your symptoms become more severe  **This list is not all possible symptoms. Contact your medical provider for any symptoms that are sever or concerning to you.   MAKE  SURE YOU   Understand these instructions.  Will watch your condition.  Will get help right away if you are not doing well or get worse.  Your e-visit answers were reviewed by a board certified advanced clinical practitioner to complete your personal care plan.  Depending on the condition, your plan could have included both over the counter or prescription medications.  If there is a problem please reply once you have received a response from your provider.  Your safety is important to [place].  If you have drug allergies check your prescription carefully.    You can use MyChart to ask questions about today's visit, request a non-urgent call back, or ask for a work or school excuse for 24 hours related to this e-Visit. If it has been greater than 24 hours you will need to follow up with your provider, or enter a new e-Visit to address those concerns. You will get an e-mail in the next two days asking about your experience.  I hope that your e-visit has been valuable and will speed your recovery. Thank you for using e-visits.   Greater than 5 but less than 10 minutes spent researching, coordinating, and implementing care for this patient today

## 2019-03-14 LAB — NOVEL CORONAVIRUS, NAA: SARS-CoV-2, NAA: NOT DETECTED

## 2019-03-18 ENCOUNTER — Other Ambulatory Visit: Payer: Self-pay

## 2019-03-18 ENCOUNTER — Ambulatory Visit (INDEPENDENT_AMBULATORY_CARE_PROVIDER_SITE_OTHER): Payer: Managed Care, Other (non HMO) | Admitting: Gastroenterology

## 2019-03-18 ENCOUNTER — Other Ambulatory Visit (INDEPENDENT_AMBULATORY_CARE_PROVIDER_SITE_OTHER): Payer: Managed Care, Other (non HMO)

## 2019-03-18 ENCOUNTER — Encounter: Payer: Self-pay | Admitting: Gastroenterology

## 2019-03-18 VITALS — BP 110/72 | HR 94 | Temp 97.4°F | Ht 66.5 in | Wt 174.4 lb

## 2019-03-18 DIAGNOSIS — R7989 Other specified abnormal findings of blood chemistry: Secondary | ICD-10-CM

## 2019-03-18 DIAGNOSIS — K760 Fatty (change of) liver, not elsewhere classified: Secondary | ICD-10-CM

## 2019-03-18 LAB — HEPATIC FUNCTION PANEL
ALT: 38 U/L — ABNORMAL HIGH (ref 0–35)
AST: 22 U/L (ref 0–37)
Albumin: 4.9 g/dL (ref 3.5–5.2)
Alkaline Phosphatase: 58 U/L (ref 39–117)
Bilirubin, Direct: 0.1 mg/dL (ref 0.0–0.3)
Total Bilirubin: 0.5 mg/dL (ref 0.2–1.2)
Total Protein: 7.7 g/dL (ref 6.0–8.3)

## 2019-03-18 LAB — PROTIME-INR
INR: 1.1 ratio — ABNORMAL HIGH (ref 0.8–1.0)
Prothrombin Time: 12.7 s (ref 9.6–13.1)

## 2019-03-18 LAB — IBC + FERRITIN
Ferritin: 168.7 ng/mL (ref 10.0–291.0)
Iron: 75 ug/dL (ref 42–145)
Saturation Ratios: 18.6 % — ABNORMAL LOW (ref 20.0–50.0)
Transferrin: 288 mg/dL (ref 212.0–360.0)

## 2019-03-18 MED ORDER — IRON 325 (65 FE) MG PO TABS: 325.0000 mg | ORAL_TABLET | Freq: Every day | ORAL | 0 refills | Status: DC

## 2019-03-18 NOTE — Patient Instructions (Signed)
Your provider has requested that you go to the basement level for lab work before leaving today. Press "B" on the elevator. The lab is located at the first door on the left as you exit the elevator.  Thank you for choosing me and [facility].  Malcolm T. Stark, Jr., MD., FACG  

## 2019-03-18 NOTE — Progress Notes (Signed)
iorn

## 2019-03-18 NOTE — Progress Notes (Signed)
    History of Present Illness: This is a 58 year old female with mild transaminase elevations for the past 2 years.  Abdominal imaging studies as below.  Gamma GT has also been elevated in the 3 80-3 90 range.  Her hemoglobin A1c had risen up to 10 and she has focused her attention on tighter diabetic control with a substantial improvement in blood sugars over the past several weeks.  When her hemoglobin AC was in the 10 range she had some mild postprandial right upper quadrant pain but this has completely resolved for the past couple months.  She states she underwent colonoscopy at [facility] in November 2014 which showed only internal hemorrhoids. Denies weight loss, constipation, diarrhea, change in stool caliber, melena, hematochezia, nausea, vomiting, dysphagia, reflux symptoms, chest pain.    AST 0 - 37 U/L 49High   23 R  32   ALT 0 - 35 U/L 58High   46High  R  64High    Abd [place] 03/07/2019 IMPRESSION: 1. Diffuse increase in liver echogenicity, a finding indicative of hepatic steatosis. While no focal liver lesions are evident on this study, it must be cautioned that the sensitivity of ultrasound for detection of focal liver lesions is diminished in this circumstance.  2. Portions of pancreas obscured by gas. Visualized portions of pancreas appear normal.  3.  Study otherwise unremarkable.  CT AP 09/17/2017 IMPRESSION: Mild fatty infiltration of the liver. Very mild fullness of the right renal pelvis without definitive obstructive change. This is likely related to extrarenal pelvis  Current Medications, Allergies, Past Medical History, Past Surgical History, Family History and Social History were reviewed in [employer] record.   Physical Exam: General: Well developed, well nourished, no acute distress Head: Normocephalic and atraumatic Eyes:  sclerae anicteric, EOMI Ears: Normal auditory acuity Mouth: No deformity or lesions Lungs: Clear throughout to  auscultation Heart: Regular rate and rhythm; no murmurs, rubs or bruits Abdomen: Soft, non tender and non distended. No masses, hepatosplenomegaly or hernias noted. Normal Bowel sounds Rectal: Not done Musculoskeletal: Symmetrical with no gross deformities  Pulses:  Normal pulses noted Extremities: No clubbing, cyanosis, edema or deformities noted Neurological: Alert oriented x 4, grossly nonfocal Psychological:  Alert and cooperative. Normal mood and affect   Assessment and Recommendations:  1. Elevated transaminases and gamma GT likely due to hepatic steatosis.  Rule out other causes of transaminase elevation including statin drugs, hepatitis and other liver diseases.  Send standard serologies and repeat LFTs.  Long-term appropriate management of DM, lipids and weight per her PCP.  Long-term carb modified, fat modified diet.  Discuss with PCP regarding 2-3 month hold of statin drugs to help exclude that as a potential etiology. REV in 6 weeks.

## 2019-03-20 ENCOUNTER — Telehealth: Payer: Self-pay | Admitting: *Deleted

## 2019-03-20 NOTE — Telephone Encounter (Signed)
Phone call to patient to follow up on how she was feeling. She was not able to come to Class 3 last week due to suspected COVID. Also she wanted to reschedule Diabetes Class 3. No answer after multiple rings.

## 2019-03-21 LAB — ANTI-SMOOTH MUSCLE ANTIBODY, IGG: Actin (Smooth Muscle) Antibody (IGG): <20 U (ref <20)

## 2019-03-21 LAB — HEPATITIS C ANTIBODY
Hepatitis C Ab: NONREACTIVE
SIGNAL TO CUT-OFF: 0.02 (ref <1.00)

## 2019-03-21 LAB — HEPATITIS B SURFACE ANTIBODY,QUALITATIVE: Hep B S Ab: NONREACTIVE

## 2019-03-21 LAB — HEPATITIS B SURFACE ANTIGEN: Hepatitis B Surface Ag: NONREACTIVE

## 2019-03-21 LAB — ANA: Anti Nuclear Antibody (ANA): NEGATIVE

## 2019-03-21 LAB — ALPHA-1-ANTITRYPSIN: A-1 Antitrypsin, Ser: 138 mg/dL (ref 83–199)

## 2019-03-21 LAB — MITOCHONDRIAL ANTIBODIES: Mitochondrial M2 Ab, IgG: <=20 U

## 2019-03-21 LAB — CERULOPLASMIN: Ceruloplasmin: 32 mg/dL (ref 18–53)

## 2019-03-31 ENCOUNTER — Telehealth: Payer: Self-pay | Admitting: *Deleted

## 2019-03-31 NOTE — Telephone Encounter (Signed)
Phone call to follow up with patient. She reports that she tested negative for COVID but her husband tested positive after 2 tests. He had mild symptoms and she will still be in quarantine for another week. She reports that she has lost 20 lbs and her husband has lost 22 lbs by following the diabetes meal planning guidelines and exercising. She will call when she can return for her last class.

## 2019-04-03 ENCOUNTER — Other Ambulatory Visit: Payer: Self-pay

## 2019-04-03 DIAGNOSIS — Z20822 Contact with and (suspected) exposure to covid-19: Secondary | ICD-10-CM

## 2019-04-05 LAB — NOVEL CORONAVIRUS, NAA: SARS-CoV-2, NAA: NOT DETECTED

## 2019-04-08 ENCOUNTER — Encounter: Payer: Self-pay | Admitting: Internal Medicine

## 2019-04-23 LAB — HM DIABETES EYE EXAM

## 2019-05-01 ENCOUNTER — Ambulatory Visit: Payer: Managed Care, Other (non HMO) | Admitting: Gastroenterology

## 2019-05-15 ENCOUNTER — Other Ambulatory Visit: Payer: Self-pay

## 2019-05-15 ENCOUNTER — Ambulatory Visit (INDEPENDENT_AMBULATORY_CARE_PROVIDER_SITE_OTHER): Payer: Managed Care, Other (non HMO) | Admitting: Internal Medicine

## 2019-05-15 ENCOUNTER — Encounter: Payer: Self-pay | Admitting: Internal Medicine

## 2019-05-15 VITALS — BP 122/82 | HR 78 | Temp 98.0°F | Wt 165.0 lb

## 2019-05-15 DIAGNOSIS — E782 Mixed hyperlipidemia: Secondary | ICD-10-CM | POA: Diagnosis not present

## 2019-05-15 DIAGNOSIS — Z20828 Contact with and (suspected) exposure to other viral communicable diseases: Secondary | ICD-10-CM | POA: Diagnosis not present

## 2019-05-15 DIAGNOSIS — E559 Vitamin D deficiency, unspecified: Secondary | ICD-10-CM | POA: Diagnosis not present

## 2019-05-15 DIAGNOSIS — E1165 Type 2 diabetes mellitus with hyperglycemia: Secondary | ICD-10-CM

## 2019-05-15 DIAGNOSIS — Z20822 Contact with and (suspected) exposure to covid-19: Secondary | ICD-10-CM

## 2019-05-15 LAB — COMPREHENSIVE METABOLIC PANEL
ALT: 22 U/L (ref 0–35)
AST: 17 U/L (ref 0–37)
Albumin: 4.7 g/dL (ref 3.5–5.2)
Alkaline Phosphatase: 58 U/L (ref 39–117)
BUN: 17 mg/dL (ref 6–23)
CO2: 29 mEq/L (ref 19–32)
Calcium: 10 mg/dL (ref 8.4–10.5)
Chloride: 102 mEq/L (ref 96–112)
Creatinine, Ser: 0.69 mg/dL (ref 0.40–1.20)
GFR: 87.57 mL/min (ref >60.00)
Glucose, Bld: 93 mg/dL (ref 70–99)
Potassium: 4.3 mEq/L (ref 3.5–5.1)
Sodium: 141 mEq/L (ref 135–145)
Total Bilirubin: 0.5 mg/dL (ref 0.2–1.2)
Total Protein: 7 g/dL (ref 6.0–8.3)

## 2019-05-15 LAB — LIPID PANEL
Cholesterol: 197 mg/dL (ref 0–200)
HDL: 40.1 mg/dL (ref >39.00)
LDL Cholesterol: 122 mg/dL — ABNORMAL HIGH (ref 0–99)
NonHDL: 157.1
Total CHOL/HDL Ratio: 5
Triglycerides: 178 mg/dL — ABNORMAL HIGH (ref 0.0–149.0)
VLDL: 35.6 mg/dL (ref 0.0–40.0)

## 2019-05-15 LAB — VITAMIN D 25 HYDROXY (VIT D DEFICIENCY, FRACTURES): VITD: 45.22 ng/mL (ref 30.00–100.00)

## 2019-05-15 LAB — HEMOGLOBIN A1C: Hgb A1c MFr Bld: 4.3 % — ABNORMAL LOW (ref 4.6–6.5)

## 2019-05-15 NOTE — Assessment & Plan Note (Signed)
C met and lipid profile today Encouraged her to consume a low-fat diet Continue Simvastatin and Fish Oil for now

## 2019-05-15 NOTE — Assessment & Plan Note (Signed)
A1c today No urine microalbumin secondary to ACE therapy Encouraged her to consume a low carb diet and exercise for weight loss, congratulated her on 20 pound weight loss Continue Metformin for now, advised her to consume with food to decrease GI upset Foot exam today We will obtain copy of dilated eye exam from November 2020 Flu and Pneumovax UTD

## 2019-05-15 NOTE — Patient Instructions (Signed)
Fat and Cholesterol Restricted Eating Plan Getting too much fat and cholesterol in your diet may cause health problems. Choosing the right foods helps keep your fat and cholesterol at normal levels. This can keep you from getting certain diseases. Your doctor may recommend an eating plan that includes:  Total fat: ______% or less of total calories a day.  Saturated fat: ______% or less of total calories a day.  Cholesterol: less than _________mg a day.  Fiber: ______g a day. What are tips for following this plan? Meal planning  At meals, divide your plate into four equal parts: ? Fill one-half of your plate with vegetables and green salads. ? Fill one-fourth of your plate with whole grains. ? Fill one-fourth of your plate with low-fat (lean) protein foods.  Eat fish that is high in omega-3 fats at least two times a week. This includes mackerel, tuna, sardines, and salmon.  Eat foods that are high in fiber, such as whole grains, beans, apples, broccoli, carrots, peas, and barley. General tips   Work with your doctor to lose weight if you need to.  Avoid: ? Foods with added sugar. ? Fried foods. ? Foods with partially hydrogenated oils.  Limit alcohol intake to no more than 1 drink a day for nonpregnant women and 2 drinks a day for men. One drink equals 12 oz of beer, 5 oz of wine, or 1 oz of hard liquor. Reading food labels  Check food labels for: ? Trans fats. ? Partially hydrogenated oils. ? Saturated fat (g) in each serving. ? Cholesterol (mg) in each serving. ? Fiber (g) in each serving.  Choose foods with healthy fats, such as: ? Monounsaturated fats. ? Polyunsaturated fats. ? Omega-3 fats.  Choose grain products that have whole grains. Look for the word "whole" as the first word in the ingredient list. Cooking  Cook foods using low-fat methods. These include baking, boiling, grilling, and broiling.  Eat more home-cooked foods. Eat at restaurants and buffets  less often.  Avoid cooking using saturated fats, such as butter, cream, palm oil, palm kernel oil, and coconut oil. Recommended foods  Fruits  All fresh, canned (in natural juice), or frozen fruits. Vegetables  Fresh or frozen vegetables (raw, steamed, roasted, or grilled). Green salads. Grains  Whole grains, such as whole wheat or whole grain breads, crackers, cereals, and pasta. Unsweetened oatmeal, bulgur, barley, quinoa, or brown rice. Corn or whole wheat flour tortillas. Meats and other protein foods  Ground beef (85% or leaner), grass-fed beef, or beef trimmed of fat. Skinless chicken or turkey. Ground chicken or turkey. Pork trimmed of fat. All fish and seafood. Egg whites. Dried beans, peas, or lentils. Unsalted nuts or seeds. Unsalted canned beans. Nut butters without added sugar or oil. Dairy  Low-fat or nonfat dairy products, such as skim or 1% milk, 2% or reduced-fat cheeses, low-fat and fat-free ricotta or cottage cheese, or plain low-fat and nonfat yogurt. Fats and oils  Tub margarine without trans fats. Light or reduced-fat mayonnaise and salad dressings. Avocado. Olive, canola, sesame, or safflower oils. The items listed above may not be a complete list of foods and beverages you can eat. Contact a dietitian for more information. Foods to avoid Fruits  Canned fruit in heavy syrup. Fruit in cream or butter sauce. Fried fruit. Vegetables  Vegetables cooked in cheese, cream, or butter sauce. Fried vegetables. Grains  White bread. White pasta. White rice. Cornbread. Bagels, pastries, and croissants. Crackers and snack foods that contain trans fat   and hydrogenated oils. Meats and other protein foods  Fatty cuts of meat. Ribs, chicken wings, bacon, sausage, bologna, salami, chitterlings, fatback, hot dogs, bratwurst, and packaged lunch meats. Liver and organ meats. Whole eggs and egg yolks. Chicken and turkey with skin. Fried meat. Dairy  Whole or 2% milk, cream,  half-and-half, and cream cheese. Whole milk cheeses. Whole-fat or sweetened yogurt. Full-fat cheeses. Nondairy creamers and whipped toppings. Processed cheese, cheese spreads, and cheese curds. Beverages  Alcohol. Sugar-sweetened drinks such as sodas, lemonade, and fruit drinks. Fats and oils  Butter, stick margarine, lard, shortening, ghee, or bacon fat. Coconut, palm kernel, and palm oils. Sweets and desserts  Corn syrup, sugars, honey, and molasses. Candy. Jam and jelly. Syrup. Sweetened cereals. Cookies, pies, cakes, donuts, muffins, and ice cream. The items listed above may not be a complete list of foods and beverages you should avoid. Contact a dietitian for more information. Summary  Choosing the right foods helps keep your fat and cholesterol at normal levels. This can keep you from getting certain diseases.  At meals, fill one-half of your plate with vegetables and green salads.  Eat high-fiber foods, like whole grains, beans, apples, carrots, peas, and barley.  Limit added sugar, saturated fats, alcohol, and fried foods. This information is not intended to replace advice given to you by your health care provider. Make sure you discuss any questions you have with your health care provider. Document Released: 11/28/2011 Document Revised: 01/30/2018 Document Reviewed: 02/13/2017 Elsevier Patient Education  2020 Elsevier Inc.  

## 2019-05-15 NOTE — Progress Notes (Signed)
Subjective:    Patient ID: Mary Schaefer, female    DOB: [birth year], 58 y.o.   MRN: [id number]  HPI  Pt presents to the clinic today for 3 month follow up of DM 2 and HLD. Her last A1C was 10.7, 02/2019. Her total cholesterol was 265, triglycerides 646, HDL 33, and LDL 161, 02/2019. She was started on Metformin, Rosuvastatin and Fish Oil. She had a bump in her liver enzymes so GI changed Rosuvastatin to Simvastatin.  She is not taking the Ezetimibe and Fenofibrate. She has been taking these medications as prescribed and denies GI issues or myalgias. Her sugars average 134. She has had some hypoglycemia when exercising but has been keeping some apple juice with.She has vision changes to fungal keratitis and numbness in her feet but the increase thirst and urinary frequency has resolved.  She checks her feet routinely. Her last eye exam was 04/2019. Flu and pneumovax 02/2019. She has been trying to consume a low carb, low fat diet.   Of note, her last Vit D was low. She took Ergocalciferol 50000 units weekly x 12 weeks and finished that course. She is now taking Calcium and Vit D OTC.  She would like to be tested for COVID antibodies. Her husband has been COVID but she never tested positive.  Review of Systems      Past Medical History:  Diagnosis Date  . Blood in stool   . Childhood asthma   . Diabetes mellitus without complication ([place])   . Endometriosis 1997  . Hypertension   . Thyroid disease     Current Outpatient Medications  Medication Sig Dispense Refill  . acetaminophen (TYLENOL) 500 MG tablet Take 1,000 mg by mouth as needed for mild pain.    . Ascorbic Acid (VITAMIN C PO) Take 1 tablet by mouth daily.    . cyanocobalamin 2000 MCG tablet Take 2,000 mcg by mouth 2 (two) times daily.    . Ferrous Sulfate (IRON) 325 (65 Fe) MG TABS Take 1 tablet (325 mg total) by mouth daily. 30 tablet 0  . glucose blood test strip Check sugar BID as needed. DX E11.9 100 each 6  . hydrOXYzine  (ATARAX/VISTARIL) 25 MG tablet Take 1 tablet (25 mg total) by mouth 3 (three) times daily as needed. 30 tablet 2  . levothyroxine (SYNTHROID) 112 MCG tablet Take 1 tablet (112 mcg total) by mouth daily. 90 tablet 2  . lisinopril-hydrochlorothiazide (ZESTORETIC) 10-12.5 MG tablet Take 1 tablet by mouth daily. 90 tablet 2  . metFORMIN (GLUCOPHAGE) 1000 MG tablet Take 0.5 tablets (500 mg total) by mouth daily with breakfast for 7 days, THEN 0.5 tablets (500 mg total) 2 (two) times daily with a meal for 7 days, THEN 1 tablet (1,000 mg total) 2 (two) times daily with a meal. 60 tablet 2  . naproxen sodium (ALEVE) 220 MG tablet Take 440 mg by mouth as needed (pain).    [name] ofloxacin (OCUFLOX) 0.3 % ophthalmic solution Apply 1 drop to eye daily.    . Omega-3 Fatty Acids (FISH OIL) 1200 MG CAPS Take 1 capsule by mouth 3 (three) times daily.    . prednisoLONE acetate (PRED FORTE) 1 % ophthalmic suspension INSTILL 1 DROP INTO LEFT EYE 4 TIMES A DAY    . rosuvastatin (CRESTOR) 10 MG tablet TAKE 1 TABLET BY MOUTH EVERY DAY (Patient not taking: Reported on 03/18/2019) 90 tablet 0  . simvastatin (ZOCOR) 10 MG tablet Take 10 mg by mouth daily.    [name]  Vitamin D, Ergocalciferol, (DRISDOL) 1.25 MG (50000 UT) CAPS capsule Take 1 capsule (50,000 Units total) by mouth every 7 (seven) days. 12 capsule 0   No current facility-administered medications for this visit.     Allergies  Allergen Reactions  . Aspirin Shortness Of Breath and Hives  . Penicillins Hives    Family History  Problem Relation Age of Onset  . Ovarian cancer Mother   . Hyperlipidemia Mother   . Alcohol abuse Father   . Hyperlipidemia Father   . Heart disease Father   . Stroke Father   . Hypertension Father   . Diabetes Sister   . Colon cancer Neg Hx   . Esophageal cancer Neg Hx     Social History   Socioeconomic History  . Marital status: Unknown    Spouse name: Not on file  . Number of children: Not on file  . Years of education: Not  on file  . Highest education level: Not on file  Occupational History  . Not on file  Social Needs  . Financial resource strain: Not on file  . Food insecurity    Worry: Not on file    Inability: Not on file  . Transportation needs    Medical: Not on file    Non-medical: Not on file  Tobacco Use  . Smoking status: Never Smoker  . Smokeless tobacco: Never Used  Substance and Sexual Activity  . Alcohol use: Yes    Alcohol/week: 1.0 - 2.0 standard drinks    Types: 1 - 2 Glasses of wine per week    Comment: rare  . Drug use: Not Currently  . Sexual activity: Yes  Lifestyle  . Physical activity    Days per week: Not on file    Minutes per session: Not on file  . Stress: Not on file  Relationships  . Social Herbalist on phone: Not on file    Gets together: Not on file    Attends religious service: Not on file    Active member of club or organization: Not on file    Attends meetings of clubs or organizations: Not on file    Relationship status: Not on file  . Intimate partner violence    Fear of current or ex partner: Not on file    Emotionally abused: Not on file    Physically abused: Not on file    Forced sexual activity: Not on file  Other Topics Concern  . Not on file  Social History Narrative  . Not on file     Constitutional: Denies fever, malaise, fatigue, headache or abrupt weight changes.  HEENT: Pt reports vision changes in left eye. Denies eye pain, eye redness, ear pain, ringing in the ears, wax buildup, runny nose, nasal congestion, bloody nose, or sore throat. Respiratory: Denies difficulty breathing, shortness of breath, cough or sputum production.   Cardiovascular: Denies chest pain, chest tightness, palpitations or swelling in the hands or feet.  Gastrointestinal: Denies abdominal pain, bloating, constipation, diarrhea or blood in the stool.  GU: Denies urgency, frequency, pain with urination, burning sensation, blood in urine, odor or  discharge. Skin: Denies redness, rashes, lesions or ulcercations.  Neurological: Pt reports mild numbness in feet. Denies dizziness, difficulty with memory, difficulty with speech or problems with balance and coordination.    No other specific complaints in a complete review of systems (except as listed in HPI above).  Objective:   Physical Exam  BP 122/82  Pulse 78   Temp 98 F (36.7 C) (Temporal)   Wt 165 lb (74.8 kg)   LMP 09/10/2016   SpO2 98%   BMI 26.23 kg/m   Wt Readings from Last 3 Encounters:  03/18/19 174 lb 6 oz (79.1 kg)  03/06/19 178 lb 8 oz (81 kg)  02/27/19 181 lb 4.8 oz (82.2 kg)    General: Appears her stated age, well developed, well nourished in NAD. Skin: Warm, dry and intact. No ulcerations noted. Cardiovascular: Normal rate and rhythm. S1,S2 noted.  No murmur, rubs or gallops noted. No JVD or BLE edema. No carotid bruits noted. Pulmonary/Chest: Normal effort and positive vesicular breath sounds. No respiratory distress. No wheezes, rales or ronchi noted.  Neurological: Alert and oriented. Sensation intact to BLE.    BMET    Component Value Date/Time   NA 135 02/12/2019 1508   K 3.8 02/12/2019 1508   CL 95 (L) 02/12/2019 1508   CO2 27 02/12/2019 1508   GLUCOSE 190 (H) 02/12/2019 1508   BUN 18 02/12/2019 1508   CREATININE 0.75 02/12/2019 1508   CREATININE 0.83 05/24/2018 1609   CALCIUM 9.4 02/12/2019 1508   GFRNONAA >60 09/17/2017 0732   GFRAA >60 09/17/2017 0732    Lipid Panel     Component Value Date/Time   CHOL 265 (H) 02/12/2019 1508   TRIG (H) 02/12/2019 1508    646.0 Triglyceride is over 400; calculations on Lipids are invalid.   HDL 33.00 (L) 02/12/2019 1508   CHOLHDL 8 02/12/2019 1508   VLDL 62.4 (H) 02/01/2018 0758   LDLCALC 219 (H) 05/24/2018 1609    CBC    Component Value Date/Time   WBC 5.8 02/12/2019 1508   RBC 5.07 02/12/2019 1508   HGB 15.2 (H) 02/12/2019 1508   HCT 45.4 02/12/2019 1508   PLT 277.0 02/12/2019 1508    MCV 89.6 02/12/2019 1508   MCH 29.7 09/17/2017 0732   MCHC 33.6 02/12/2019 1508   RDW 13.3 02/12/2019 1508    Hgb A1C Lab Results  Component Value Date   HGBA1C 10.7 (H) 02/13/2019            Assessment & Plan:  Exposure to Covid 19:  We will obtain SARS 2 antibodies IgG/IgM  Webb Silversmith, NP This visit occurred during the SARS-CoV-2 public health emergency.  Safety protocols were in place, including screening questions prior to the visit, additional usage of staff PPE, and extensive cleaning of exam room while observing appropriate contact time as indicated for disinfecting solutions.

## 2019-05-15 NOTE — Assessment & Plan Note (Signed)
Vitamin D level today Continue Calcium and Vitamin D OTC

## 2019-05-16 LAB — SARS COV-2 SEROLOGY(COVID-19)AB(IGG,IGM),IMMUNOASSAY
SARS CoV-2 AB IgG: NEGATIVE
SARS CoV-2 IgM: NEGATIVE

## 2019-05-19 ENCOUNTER — Encounter: Payer: Self-pay | Admitting: Internal Medicine

## 2019-05-19 MED ORDER — METFORMIN HCL 500 MG PO TABS: 500.0000 mg | ORAL_TABLET | Freq: Two times a day (BID) | ORAL | 0 refills | Status: DC

## 2019-05-27 ENCOUNTER — Ambulatory Visit: Payer: Managed Care, Other (non HMO) | Admitting: Gastroenterology

## 2019-05-30 ENCOUNTER — Other Ambulatory Visit: Payer: Self-pay | Admitting: Internal Medicine

## 2019-05-31 ENCOUNTER — Other Ambulatory Visit: Payer: Self-pay | Admitting: Internal Medicine

## 2019-06-16 ENCOUNTER — Encounter: Payer: Self-pay | Admitting: Internal Medicine

## 2019-06-18 ENCOUNTER — Encounter: Payer: Self-pay | Admitting: *Deleted

## 2019-06-24 ENCOUNTER — Encounter: Payer: Self-pay | Admitting: Internal Medicine

## 2019-06-24 ENCOUNTER — Other Ambulatory Visit: Payer: Self-pay

## 2019-06-24 ENCOUNTER — Ambulatory Visit (INDEPENDENT_AMBULATORY_CARE_PROVIDER_SITE_OTHER): Payer: 59 | Admitting: Internal Medicine

## 2019-06-24 VITALS — BP 124/78 | HR 94 | Temp 98.2°F | Wt 162.0 lb

## 2019-06-24 DIAGNOSIS — R300 Dysuria: Secondary | ICD-10-CM | POA: Diagnosis not present

## 2019-06-24 DIAGNOSIS — R350 Frequency of micturition: Secondary | ICD-10-CM

## 2019-06-24 DIAGNOSIS — R3915 Urgency of urination: Secondary | ICD-10-CM | POA: Diagnosis not present

## 2019-06-24 LAB — POC URINALSYSI DIPSTICK (AUTOMATED)
Bilirubin, UA: NEGATIVE
Glucose, UA: NEGATIVE
Nitrite, UA: POSITIVE
Protein, UA: POSITIVE — AB
Spec Grav, UA: >=1.03 — AB (ref 1.010–1.025)
Urobilinogen, UA: 0.2 E.U./dL
pH, UA: 5.5 (ref 5.0–8.0)

## 2019-06-24 MED ORDER — NITROFURANTOIN MONOHYD MACRO 100 MG PO CAPS: 100.0000 mg | ORAL_CAPSULE | Freq: Two times a day (BID) | ORAL | 0 refills | Status: DC

## 2019-06-24 NOTE — Patient Instructions (Signed)
Urinary Tract Infection, Adult A urinary tract infection (UTI) is an infection of any part of the urinary tract. The urinary tract includes:  The kidneys.  The ureters.  The bladder.  The urethra. These organs make, store, and get rid of pee (urine) in the body. What are the causes? This is caused by germs (bacteria) in your genital area. These germs grow and cause swelling (inflammation) of your urinary tract. What increases the risk? You are more likely to develop this condition if:  You have a small, thin tube (catheter) to drain pee.  You cannot control when you pee or poop (incontinence).  You are female, and: ? You use these methods to prevent pregnancy:  A medicine that kills sperm (spermicide).  A device that blocks sperm (diaphragm). ? You have low levels of a female hormone (estrogen). ? You are pregnant.  You have genes that add to your risk.  You are sexually active.  You take antibiotic medicines.  You have trouble peeing because of: ? A prostate that is bigger than normal, if you are female. ? A blockage in the part of your body that drains pee from the bladder (urethra). ? A kidney stone. ? A nerve condition that affects your bladder (neurogenic bladder). ? Not getting enough to drink. ? Not peeing often enough.  You have other conditions, such as: ? Diabetes. ? A weak disease-fighting system (immune system). ? Sickle cell disease. ? Gout. ? Injury of the spine. What are the signs or symptoms? Symptoms of this condition include:  Needing to pee right away (urgently).  Peeing often.  Peeing small amounts often.  Pain or burning when peeing.  Blood in the pee.  Pee that smells bad or not like normal.  Trouble peeing.  Pee that is cloudy.  Fluid coming from the vagina, if you are female.  Pain in the belly or lower back. Other symptoms include:  Throwing up (vomiting).  No urge to eat.  Feeling mixed up (confused).  Being tired  and grouchy (irritable).  A fever.  Watery poop (diarrhea). How is this treated? This condition may be treated with:  Antibiotic medicine.  Other medicines.  Drinking enough water. Follow these instructions at home:  Medicines  Take over-the-counter and prescription medicines only as told by your doctor.  If you were prescribed an antibiotic medicine, take it as told by your doctor. Do not stop taking it even if you start to feel better. General instructions  Make sure you: ? Pee until your bladder is empty. ? Do not hold pee for a long time. ? Empty your bladder after sex. ? Wipe from front to back after pooping if you are a female. Use each tissue one time when you wipe.  Drink enough fluid to keep your pee pale yellow.  Keep all follow-up visits as told by your doctor. This is important. Contact a doctor if:  You do not get better after 1-2 days.  Your symptoms go away and then come back. Get help right away if:  You have very bad back pain.  You have very bad pain in your lower belly.  You have a fever.  You are sick to your stomach (nauseous).  You are throwing up. Summary  A urinary tract infection (UTI) is an infection of any part of the urinary tract.  This condition is caused by germs in your genital area.  There are many risk factors for a UTI. These include having a small, thin   tube to drain pee and not being able to control when you pee or poop.  Treatment includes antibiotic medicines for germs.  Drink enough fluid to keep your pee pale yellow. This information is not intended to replace advice given to you by your health care provider. Make sure you discuss any questions you have with your health care provider. Document Revised: 05/16/2018 Document Reviewed: 12/06/2017 Elsevier Patient Education  2020 [employer] Inc.  

## 2019-06-24 NOTE — Progress Notes (Signed)
HPI  Pt presents to the clinic today with c/o urgency, frequency, dysuria and bladder pressure. This started yesterday. She denies blood in her urine. She denies vaginal discharge, odor, irritation or abnormal bleeding. She denies fever, chills, nausea or low back pain. She has not tried anything OTC for her symptoms.    Review of Systems  Past Medical History:  Diagnosis Date  . Blood in stool   . Childhood asthma   . Diabetes mellitus without complication ([place])   . Endometriosis 1997  . Hypertension   . Thyroid disease     Family History  Problem Relation Age of Onset  . Ovarian cancer Mother   . Hyperlipidemia Mother   . Alcohol abuse Father   . Hyperlipidemia Father   . Heart disease Father   . Stroke Father   . Hypertension Father   . Diabetes Sister   . Colon cancer Neg Hx   . Esophageal cancer Neg Hx     Social History   Socioeconomic History  . Marital status: Unknown    Spouse name: Not on file  . Number of children: Not on file  . Years of education: Not on file  . Highest education level: Not on file  Occupational History  . Not on file  Tobacco Use  . Smoking status: Never Smoker  . Smokeless tobacco: Never Used  Substance and Sexual Activity  . Alcohol use: Yes    Alcohol/week: 1.0 - 2.0 standard drinks    Types: 1 - 2 Glasses of wine per week    Comment: rare  . Drug use: Not Currently  . Sexual activity: Yes  Other Topics Concern  . Not on file  Social History Narrative  . Not on file   Social Determinants of Health   Financial Resource Strain:   . Difficulty of Paying Living Expenses: Not on file  Food Insecurity:   . Worried About Charity fundraiser in the Last Year: Not on file  . Ran Out of Food in the Last Year: Not on file  Transportation Needs:   . Lack of Transportation (Medical): Not on file  . Lack of Transportation (Non-Medical): Not on file  Physical Activity:   . Days of Exercise per Week: Not on file  . Minutes of  Exercise per Session: Not on file  Stress:   . Feeling of Stress : Not on file  Social Connections:   . Frequency of Communication with Friends and Family: Not on file  . Frequency of Social Gatherings with Friends and Family: Not on file  . Attends Religious Services: Not on file  . Active Member of Clubs or Organizations: Not on file  . Attends Archivist Meetings: Not on file  . Marital Status: Not on file  Intimate Partner Violence:   . Fear of Current or Ex-Partner: Not on file  . Emotionally Abused: Not on file  . Physically Abused: Not on file  . Sexually Abused: Not on file    Allergies  Allergen Reactions  . Aspirin Shortness Of Breath and Hives  . Penicillins Hives     Constitutional: Denies fever, malaise, fatigue, headache or abrupt weight changes.   GU: Pt reports urgency, frequency and pain with urination. Denies burning sensation, blood in urine, odor or discharge. Skin: Denies redness, rashes, lesions or ulcercations.   No other specific complaints in a complete review of systems (except as listed in HPI above).    Objective:   Physical Exam BP  124/78   Pulse 94   Temp 98.2 F (36.8 C) (Temporal)   Wt 162 lb (73.5 kg)   LMP 09/10/2016   SpO2 98%   BMI 25.76 kg/m   Wt Readings from Last 3 Encounters:  05/15/19 165 lb (74.8 kg)  03/18/19 174 lb 6 oz (79.1 kg)  03/06/19 178 lb 8 oz (81 kg)    General: Appears her stated age, well developed, well nourished in NAD. Cardiovascular: Normal rate and rhythm. S1,S2 noted.   Pulmonary/Chest: Normal effort and positive vesicular breath sounds. No respiratory distress. No wheezes, rales or ronchi noted.  Abdomen: Soft. Normal bowel sounds. No distention or masses noted.  Tender to palpation over the bladder area. No CVA tenderness.        Assessment & Plan:   Urgency, Frequency, Dysuria:  Urinalysis: 3+ leuks, pos nitrites, 1+ leuks Will send urine culture eRx sent if for Macrobid 100 mg BID x  5 days OK to take AZO OTC Drink plenty of fluids  RTC as needed or if symptoms persist. Webb Silversmith, NP This visit occurred during the SARS-CoV-2 public health emergency.  Safety protocols were in place, including screening questions prior to the visit, additional usage of staff PPE, and extensive cleaning of exam room while observing appropriate contact time as indicated for disinfecting solutions.

## 2019-06-26 LAB — URINE CULTURE
MICRO NUMBER:: 10032878
SPECIMEN QUALITY:: ADEQUATE

## 2019-08-13 ENCOUNTER — Other Ambulatory Visit: Payer: Self-pay | Admitting: Internal Medicine

## 2019-08-13 DIAGNOSIS — E1165 Type 2 diabetes mellitus with hyperglycemia: Secondary | ICD-10-CM

## 2019-08-13 DIAGNOSIS — E782 Mixed hyperlipidemia: Secondary | ICD-10-CM

## 2019-08-29 ENCOUNTER — Other Ambulatory Visit: Payer: 59

## 2019-09-01 ENCOUNTER — Other Ambulatory Visit: Payer: 59

## 2019-09-02 ENCOUNTER — Other Ambulatory Visit (INDEPENDENT_AMBULATORY_CARE_PROVIDER_SITE_OTHER): Payer: 59

## 2019-09-02 ENCOUNTER — Other Ambulatory Visit: Payer: 59

## 2019-09-02 ENCOUNTER — Other Ambulatory Visit: Payer: Self-pay

## 2019-09-02 DIAGNOSIS — R7309 Other abnormal glucose: Secondary | ICD-10-CM

## 2019-09-02 DIAGNOSIS — E782 Mixed hyperlipidemia: Secondary | ICD-10-CM

## 2019-09-02 DIAGNOSIS — E1165 Type 2 diabetes mellitus with hyperglycemia: Secondary | ICD-10-CM

## 2019-09-02 LAB — LIPID PANEL
Cholesterol: 237 mg/dL — ABNORMAL HIGH (ref 0–200)
HDL: 43.4 mg/dL (ref >39.00)
NonHDL: 193.84
Total CHOL/HDL Ratio: 5
Triglycerides: 216 mg/dL — ABNORMAL HIGH (ref 0.0–149.0)
VLDL: 43.2 mg/dL — ABNORMAL HIGH (ref 0.0–40.0)

## 2019-09-02 LAB — LDL CHOLESTEROL, DIRECT: Direct LDL: 154 mg/dL

## 2019-09-03 ENCOUNTER — Encounter: Payer: Self-pay | Admitting: Internal Medicine

## 2019-09-03 LAB — HEMOGLOBIN A1C
Hgb A1c MFr Bld: 6 % of total Hgb — ABNORMAL HIGH (ref <5.7)
Mean Plasma Glucose: 126 (calc)
eAG (mmol/L): 7 (calc)

## 2019-09-03 NOTE — Telephone Encounter (Signed)
See result note.  

## 2019-09-04 MED ORDER — METFORMIN HCL ER 500 MG PO TB24: 500.0000 mg | ORAL_TABLET | Freq: Every day | ORAL | 2 refills | Status: DC

## 2019-11-08 ENCOUNTER — Encounter: Payer: Self-pay | Admitting: Internal Medicine

## 2019-11-11 ENCOUNTER — Other Ambulatory Visit: Payer: Self-pay | Admitting: Internal Medicine

## 2019-11-11 DIAGNOSIS — I10 Essential (primary) hypertension: Secondary | ICD-10-CM

## 2019-11-11 MED ORDER — METFORMIN HCL ER 500 MG PO TB24: 500.0000 mg | ORAL_TABLET | Freq: Two times a day (BID) | ORAL | 2 refills | Status: DC

## 2019-11-12 ENCOUNTER — Encounter: Payer: Self-pay | Admitting: Internal Medicine

## 2019-11-17 DIAGNOSIS — Z947 Corneal transplant status: Secondary | ICD-10-CM | POA: Diagnosis not present

## 2020-02-03 ENCOUNTER — Other Ambulatory Visit: Payer: Self-pay | Admitting: Internal Medicine

## 2020-02-04 ENCOUNTER — Other Ambulatory Visit: Payer: Self-pay | Admitting: Internal Medicine

## 2020-02-04 DIAGNOSIS — I10 Essential (primary) hypertension: Secondary | ICD-10-CM

## 2020-02-15 DIAGNOSIS — Z20822 Contact with and (suspected) exposure to covid-19: Secondary | ICD-10-CM | POA: Diagnosis not present

## 2020-02-25 ENCOUNTER — Other Ambulatory Visit: Payer: Self-pay | Admitting: Internal Medicine

## 2020-02-25 DIAGNOSIS — E039 Hypothyroidism, unspecified: Secondary | ICD-10-CM

## 2020-02-26 ENCOUNTER — Telehealth: Payer: Self-pay

## 2020-02-26 NOTE — Telephone Encounter (Signed)
aware

## 2020-02-26 NOTE — Telephone Encounter (Signed)
Pt has burning upon urination and urine odor; pt is going out of town tomorrow and wants urine specimen checked. I advised pt would need appt to be seen and offered appt this morning but pt could not schedule due to work obligation.  Advised pt could go to UC for eval and testing. Pt voiced understanding and will go if needed but pt request appt on Mon morning.Pt has no covid symptoms, no travel and no known exposure to + covid.  Pt scheduled appt with DR Tower on 03/01/20 at 9 AM. UC precautions given and pt voiced understanding. FYI to Dr Glori Bickers.

## 2020-02-27 ENCOUNTER — Encounter: Payer: Self-pay | Admitting: Internal Medicine

## 2020-02-27 DIAGNOSIS — E039 Hypothyroidism, unspecified: Secondary | ICD-10-CM

## 2020-02-27 MED ORDER — LEVOTHYROXINE SODIUM 112 MCG PO TABS: 112.0000 ug | ORAL_TABLET | Freq: Every day | ORAL | 0 refills | Status: DC

## 2020-03-01 ENCOUNTER — Ambulatory Visit: Payer: 59 | Admitting: Family Medicine

## 2020-03-01 DIAGNOSIS — Z0289 Encounter for other administrative examinations: Secondary | ICD-10-CM

## 2020-03-08 ENCOUNTER — Telehealth: Payer: Self-pay | Admitting: Physician Assistant

## 2020-03-08 ENCOUNTER — Ambulatory Visit: Payer: Self-pay

## 2020-03-08 ENCOUNTER — Ambulatory Visit
Admission: EM | Admit: 2020-03-08 | Discharge: 2020-03-08 | Disposition: A | Discharge status: Home / Self Care | Payer: Self-pay | Attending: Family Medicine | Admitting: Family Medicine

## 2020-03-08 DIAGNOSIS — W540XXA Bitten by dog, initial encounter: Secondary | ICD-10-CM

## 2020-03-08 DIAGNOSIS — M79642 Pain in left hand: Secondary | ICD-10-CM

## 2020-03-08 MED ORDER — DOXYCYCLINE HYCLATE 100 MG PO CAPS: 100.0000 mg | ORAL_CAPSULE | Freq: Two times a day (BID) | ORAL | 0 refills | Status: DC

## 2020-03-08 MED ORDER — METRONIDAZOLE 500 MG PO TABS: 500.0000 mg | ORAL_TABLET | Freq: Two times a day (BID) | ORAL | 0 refills | Status: DC

## 2020-03-08 MED ORDER — FLUCONAZOLE 200 MG PO TABS: 200.0000 mg | ORAL_TABLET | Freq: Once | ORAL | 0 refills | Status: AC

## 2020-03-08 NOTE — Discharge Instructions (Signed)
I have sent in doxycycline for you to take twice a day for 7 days  I have also sent in Flagyl for you to take twice a day for 7 days  Have sent in fluconazole in case of yeast.  Take 1 tablet at the onset of symptoms, if symptoms are still present 3 days later take the second tablet.  Follow-up with the ER for trouble swallowing, trouble breathing, high fever, redness streaking up your arm, other concerning symptoms

## 2020-03-08 NOTE — Progress Notes (Signed)
Based on what you shared with me, I feel your condition warrants further evaluation and I recommend that you be seen for a face to face office visit.  Dog bites run a very high risk of becoming infected, especially on the hand. I feel that you would benefit from being seen in person at your regular doctor, urgent care, or in the emergency department so that you can have your wound thoroughly cleansed and be started on antibiotics. You will also need to have your tetanus shot updated and be evaluated for the need for rabies injections if the dog's vaccination status is unknown.   NOTE: If you entered your credit card information for this eVisit, you will not be charged. You may see a "hold" on your card for the $35 but that hold will drop off and you will not have a charge processed.   If you are having a true medical emergency please call 911.      For an urgent face to face visit, [facility] has five urgent care centers for your convenience:      NEW:  [facility]gent [place] [facility] [place] Get Driving Directions [phone] [place], [place] [address] . 10 am - 6pm Monday - Friday    [employer] [facility] [facility] [place] [facility]) Get Driving Directions [phone] [address] [place], [place] [address] . 10 am to 8 pm Monday-Friday . 12 pm to 8 pm [facility]gent [facility] at [facility] Get Driving Directions [phone] [place], [place], [place] [address] . 8 am to 8 pm Monday-Friday . 9 am to 6 pm Saturday . 11 am to 6 pm Sunday     [facility] at [facility] Get Driving Directions  [phone] [address] [place], [place] [address] . 8 am to 8 pm Monday-Friday . 8 am to 4 pm [facility] Urgent Care at  Get Driving Directions [phone] [address] [place], [place] [address] . 12 pm to 6 pm Monday-Friday       Your e-visit answers were reviewed by a board certified advanced clinical practitioner to complete your personal care plan.  Thank you for using e-Visits.   Approximately 5 minutes was spent documenting and reviewing patient's chart.

## 2020-03-08 NOTE — ED Triage Notes (Signed)
Patient reports she was bitten by her own animal last night. Reports the dog is fully vaccinated.  Now states that the pain in her hand is 6/10, extremity is swollen, and patient reports pain radiates up her arm.

## 2020-03-08 NOTE — ED Provider Notes (Signed)
[facility]   [id number] 03/08/20 Arrival Time: 1101  CC: RASH  SUBJECTIVE:  Mary Schaefer is a 59 y.o. female who presents with a skin complaint that began.  Reports that her dog bit her left hand.  Reports that she has puncture wound and swelling with some oozing to the top of her left hand.  Reports that she has been using Neosporin to the area.  Reports that she has been taking ibuprofen for the pain, with relief.  Reports that the dog has had rabies vaccinations and she is not interested in getting these at this time. Denies similar symptoms in the past. Denies fever, chills, nausea, vomiting, erythema, oral lesions, SOB, chest pain, abdominal pain, changes in bowel or bladder function.    ROS: As per HPI.  All other pertinent ROS negative.     Past Medical History:  Diagnosis Date  . Blood in stool   . Childhood asthma   . Diabetes mellitus without complication ([place])   . Endometriosis 1997  . Hypertension   . Thyroid disease    Past Surgical History:  Procedure Laterality Date  . CORNEAL TRANSPLANT    . TUBAL LIGATION  1998   Allergies  Allergen Reactions  . Aspirin Shortness Of Breath and Hives  . Penicillins Hives   No current facility-administered medications on file prior to encounter.   Current Outpatient Medications on File Prior to Encounter  Medication Sig Dispense Refill  . acetaminophen (TYLENOL) 500 MG tablet Take 1,000 mg by mouth as needed for mild pain.    . Ascorbic Acid (VITAMIN C PO) Take 1 tablet by mouth daily.    . Calcium Carbonate-Vitamin D3 (CALCIUM 600/VITAMIN D) 600-400 MG-UNIT TABS Take by mouth.    . cyanocobalamin 2000 MCG tablet Take 2,000 mcg by mouth 2 (two) times daily.    . Ferrous Sulfate (IRON) 325 (65 Fe) MG TABS Take 1 tablet (325 mg total) by mouth daily. 30 tablet 0  . glucose blood test strip Check sugar BID as needed. DX E11.9 100 each 6  . hydrOXYzine (ATARAX/VISTARIL) 25 MG tablet Take 1 tablet (25 mg total) by  mouth 3 (three) times daily as needed. 30 tablet 2  . levothyroxine (SYNTHROID) 112 MCG tablet Take 1 tablet (112 mcg total) by mouth daily. 90 tablet 0  . lisinopril-hydrochlorothiazide (ZESTORETIC) 10-12.5 MG tablet Take 1 tablet by mouth daily. MUST SCHEDULE PHYSICAL EXAM 90 tablet 0  . metFORMIN (GLUCOPHAGE-XR) 500 MG 24 hr tablet Take 1 tablet (500 mg total) by mouth 2 (two) times daily. MUST SCHEDULE PHYSICAL 180 tablet 0  . naproxen sodium (ALEVE) 220 MG tablet Take 440 mg by mouth as needed (pain).    . nitrofurantoin, macrocrystal-monohydrate, (MACROBID) 100 MG capsule Take 1 capsule (100 mg total) by mouth 2 (two) times daily. 10 capsule 0  . ofloxacin (OCUFLOX) 0.3 % ophthalmic solution Apply 1 drop to eye daily.    . Omega-3 Fatty Acids (FISH OIL) 1200 MG CAPS Take 1 capsule by mouth 2 (two) times daily.     . prednisoLONE acetate (PRED FORTE) 1 % ophthalmic suspension INSTILL 1 DROP INTO LEFT EYE 4 TIMES A DAY    . rosuvastatin (CRESTOR) 10 MG tablet TAKE 1 TABLET BY MOUTH EVERY DAY 90 tablet 0  . simvastatin (ZOCOR) 40 MG tablet Take 40 mg by mouth daily.     Social History   Socioeconomic History  . Marital status: Married    Spouse name: Not on file  .  Number of children: Not on file  . Years of education: Not on file  . Highest education level: Not on file  Occupational History  . Not on file  Tobacco Use  . Smoking status: Never Smoker  . Smokeless tobacco: Never Used  Vaping Use  . Vaping Use: Never used  Substance and Sexual Activity  . Alcohol use: Yes    Alcohol/week: 1.0 - 2.0 standard drink    Types: 1 - 2 Glasses of wine per week    Comment: rare  . Drug use: Not Currently  . Sexual activity: Yes  Other Topics Concern  . Not on file  Social History Narrative  . Not on file   Social Determinants of Health   Financial Resource Strain:   . Difficulty of Paying Living Expenses: Not on file  Food Insecurity:   . Worried About Charity fundraiser in the  Last Year: Not on file  . Ran Out of Food in the Last Year: Not on file  Transportation Needs:   . Lack of Transportation (Medical): Not on file  . Lack of Transportation (Non-Medical): Not on file  Physical Activity:   . Days of Exercise per Week: Not on file  . Minutes of Exercise per Session: Not on file  Stress:   . Feeling of Stress : Not on file  Social Connections:   . Frequency of Communication with Friends and Family: Not on file  . Frequency of Social Gatherings with Friends and Family: Not on file  . Attends Religious Services: Not on file  . Active Member of Clubs or Organizations: Not on file  . Attends Archivist Meetings: Not on file  . Marital Status: Not on file  Intimate Partner Violence:   . Fear of Current or Ex-Partner: Not on file  . Emotionally Abused: Not on file  . Physically Abused: Not on file  . Sexually Abused: Not on file   Family History  Problem Relation Age of Onset  . Ovarian cancer Mother   . Hyperlipidemia Mother   . Alcohol abuse Father   . Hyperlipidemia Father   . Heart disease Father   . Stroke Father   . Hypertension Father   . Diabetes Sister   . Colon cancer Neg Hx   . Esophageal cancer Neg Hx     OBJECTIVE: Vitals:   03/08/20 1122  BP: 117/85  Pulse: 87  Resp: 16  Temp: 98.8 F (37.1 C)  SpO2: 98%    General appearance: alert; no distress Head: NCAT Lungs: clear to auscultation bilaterally Heart: regular rate and rhythm.  Radial pulse 2+ bilaterally Extremities: no edema Skin: warm and dry; puncture wound to dorsal surface of the left hand, the area is swollen and tender to touch, not bleeding no drainage noted Psychological: alert and cooperative; normal mood and affect  ASSESSMENT & PLAN:  1. Dog bite, initial encounter   2. Left hand pain     Meds ordered this encounter  Medications  . doxycycline (VIBRAMYCIN) 100 MG capsule    Sig: Take 1 capsule (100 mg total) by mouth 2 (two) times daily.     Dispense:  14 capsule    Refill:  0    Order Specific Question:   Supervising Provider    Answer:   Chase Picket [id number]  . metroNIDAZOLE (FLAGYL) 500 MG tablet    Sig: Take 1 tablet (500 mg total) by mouth 2 (two) times daily.    Dispense:  14 tablet    Refill:  0    Order Specific Question:   Supervising Provider    Answer:   Chase Picket [id number]  . fluconazole (DIFLUCAN) 200 MG tablet    Sig: Take 1 tablet (200 mg total) by mouth once for 1 dose.    Dispense:  2 tablet    Refill:  0    Order Specific Question:   Supervising Provider    Answer:   Chase Picket [[id number]]    Prescribe Doxy twice daily x7 days Prescribed Flagyl twice daily x7 days Prescribed fluconazole in case of yeast Take as prescribed and to completion Avoid hot showers/ baths Moisturize skin daily  Follow up with PCP if symptoms persists Return or go to the ER if you have any new or worsening symptoms such as fever, chills, nausea, vomiting, redness, swelling, discharge, if symptoms do not improve with medications  Reviewed expectations re: course of current medical issues. Questions answered. Outlined signs and symptoms indicating need for more acute intervention. Patient verbalized understanding. After Visit Summary given.   Faustino Congress, NP 03/08/20 1149

## 2020-03-10 DIAGNOSIS — Z1231 Encounter for screening mammogram for malignant neoplasm of breast: Secondary | ICD-10-CM | POA: Diagnosis not present

## 2020-03-10 DIAGNOSIS — Z8041 Family history of malignant neoplasm of ovary: Secondary | ICD-10-CM | POA: Diagnosis not present

## 2020-03-11 ENCOUNTER — Ambulatory Visit: Payer: 59 | Admitting: Internal Medicine

## 2020-04-06 DIAGNOSIS — M62830 Muscle spasm of back: Secondary | ICD-10-CM | POA: Diagnosis not present

## 2020-04-06 DIAGNOSIS — M62411 Contracture of muscle, right shoulder: Secondary | ICD-10-CM | POA: Diagnosis not present

## 2020-04-06 DIAGNOSIS — M62412 Contracture of muscle, left shoulder: Secondary | ICD-10-CM | POA: Diagnosis not present

## 2020-04-06 DIAGNOSIS — M9902 Segmental and somatic dysfunction of thoracic region: Secondary | ICD-10-CM | POA: Diagnosis not present

## 2020-04-06 DIAGNOSIS — M9901 Segmental and somatic dysfunction of cervical region: Secondary | ICD-10-CM | POA: Diagnosis not present

## 2020-04-06 DIAGNOSIS — R293 Abnormal posture: Secondary | ICD-10-CM | POA: Diagnosis not present

## 2020-04-07 DIAGNOSIS — M9902 Segmental and somatic dysfunction of thoracic region: Secondary | ICD-10-CM | POA: Diagnosis not present

## 2020-04-07 DIAGNOSIS — M62830 Muscle spasm of back: Secondary | ICD-10-CM | POA: Diagnosis not present

## 2020-04-07 DIAGNOSIS — M62412 Contracture of muscle, left shoulder: Secondary | ICD-10-CM | POA: Diagnosis not present

## 2020-04-07 DIAGNOSIS — M9901 Segmental and somatic dysfunction of cervical region: Secondary | ICD-10-CM | POA: Diagnosis not present

## 2020-04-07 DIAGNOSIS — R293 Abnormal posture: Secondary | ICD-10-CM | POA: Diagnosis not present

## 2020-04-07 DIAGNOSIS — M62411 Contracture of muscle, right shoulder: Secondary | ICD-10-CM | POA: Diagnosis not present

## 2020-04-12 ENCOUNTER — Telehealth: Payer: Self-pay | Admitting: Internal Medicine

## 2020-04-12 ENCOUNTER — Encounter: Payer: 59 | Admitting: Internal Medicine

## 2020-04-12 ENCOUNTER — Encounter: Payer: Self-pay | Admitting: Internal Medicine

## 2020-04-12 DIAGNOSIS — E1165 Type 2 diabetes mellitus with hyperglycemia: Secondary | ICD-10-CM

## 2020-04-12 NOTE — Telephone Encounter (Signed)
Opened in error

## 2020-04-12 NOTE — Telephone Encounter (Signed)
I spoke with [facility] CMA and she said to send note to Avie Echevaria NP and [facility] CMA.

## 2020-04-12 NOTE — Telephone Encounter (Signed)
[facility] - Client TELEPHONE ADVICE RECORD [facility] Patient Name: Mary Schaefer Gender: Female DOB: [birth year] Age: 59 Y 3 M 18 D Return Phone Number: [phone] (Primary) Address: City/State/Zip: [place] [address] Client [facility] - Client Client Site [facility] - Day Physician Webb Silversmith - NP Contact Type Call Who Is Calling Patient / Member / Family / Caregiver Call Type Triage / Clinical Relationship To Patient Self Return Phone Number [phone] (Primary) Chief Complaint Back Pain - General Reason for Call Symptomatic / Request for Health Information Initial Comment Caller states she has back pain on left side radiating to the front. [facility] Not Superior Translation No Nurse Assessment Nurse: Lucky Cowboy, RN, Levada Dy Date/Time Eilene Ghazi Time): 04/12/2020 9:54:26 AM Confirm and document reason for call. If symptomatic, describe symptoms. ---Caller stated that she has left back pain that radiates to the front. No burning on urination, fever, blood in urine, or rash. Does the patient have any new or worsening symptoms? ---Yes Will a triage be completed? ---Yes Related visit to physician within the last 2 weeks? ---No Does the PT have any chronic conditions? (i.e. diabetes, asthma, this includes High risk factors for pregnancy, etc.) ---Yes List chronic conditions. ---diabetes, HTN, hypothyroidism Is this a behavioral health or substance abuse call? ---No Guidelines Guideline Title Affirmed Question Affirmed Notes Nurse Date/Time (Eastern Time) Back Pain [1] Age > 70 AND [2] no history of prior similar back pain Dew, RN, Levada Dy 04/12/2020 9:56:25 AM Disp. Time Eilene Ghazi Time) Disposition Final User 04/12/2020 9:59:57 AM SEE PCP WITHIN 3 DAYS Yes Dew, RN, Marin Shutter Disagree/Comply Comply Caller Understands Yes PLEASE NOTE: All timestamps contained within this  report are represented as [place] Standard Time. CONFIDENTIALTY NOTICE: This fax transmission is intended only for the addressee. It contains information that is legally privileged, confidential or otherwise protected from use or disclosure. If you are not the intended recipient, you are strictly prohibited from reviewing, disclosing, copying using or disseminating any of this information or taking any action in reliance on or regarding this information. If you have received this fax in error, please notify us immediately by telephone so that we can arrange for its return to [place]. Phone: [phone], Toll-Free: [phone], Fax: [phone] Page: 2 of 2 Call Id: [id number] PreDisposition Go to Urgent Care/Walk-In Clinic Care Advice Given Per Guideline SEE PCP WITHIN 3 DAYS: * You need to be seen within 2 or 3 days. * PCP VISIT: Call your doctor (or NP/PA) during regular office hours and make an appointment. A clinic or urgent care center are good places to go for care if your doctor's office is closed or you can't get an appointment. NOTE: If office will be open tomorrow, tell caller to call then, not in 3 days. PAIN MEDICINES: * For pain relief, you can take either acetaminophen, ibuprofen, or naproxen. * They are over-the-counter (OTC) pain drugs. You can buy them at the drugstore. CALL BACK IF: * Numbness or weakness occurs, or bowel/bladder problems * There are any urine symptoms or fever * You become worse CARE ADVICE given per Back Pain (Adult) guideline. Referrals GO TO FACILITY OTHER - SPECIFY

## 2020-04-13 ENCOUNTER — Ambulatory Visit: Payer: Self-pay

## 2020-04-13 ENCOUNTER — Encounter: Payer: Self-pay | Admitting: Emergency Medicine

## 2020-04-13 ENCOUNTER — Other Ambulatory Visit: Payer: Self-pay

## 2020-04-13 ENCOUNTER — Ambulatory Visit
Admission: EM | Admit: 2020-04-13 | Discharge: 2020-04-13 | Disposition: A | Discharge status: Home / Self Care | Payer: BC Managed Care – PPO | Attending: Emergency Medicine | Admitting: Emergency Medicine

## 2020-04-13 DIAGNOSIS — R109 Unspecified abdominal pain: Secondary | ICD-10-CM | POA: Insufficient documentation

## 2020-04-13 DIAGNOSIS — M9902 Segmental and somatic dysfunction of thoracic region: Secondary | ICD-10-CM | POA: Diagnosis not present

## 2020-04-13 DIAGNOSIS — M9901 Segmental and somatic dysfunction of cervical region: Secondary | ICD-10-CM | POA: Diagnosis not present

## 2020-04-13 DIAGNOSIS — M62830 Muscle spasm of back: Secondary | ICD-10-CM | POA: Diagnosis not present

## 2020-04-13 DIAGNOSIS — R81 Glycosuria: Secondary | ICD-10-CM | POA: Insufficient documentation

## 2020-04-13 DIAGNOSIS — M62412 Contracture of muscle, left shoulder: Secondary | ICD-10-CM | POA: Diagnosis not present

## 2020-04-13 DIAGNOSIS — M62411 Contracture of muscle, right shoulder: Secondary | ICD-10-CM | POA: Diagnosis not present

## 2020-04-13 DIAGNOSIS — R293 Abnormal posture: Secondary | ICD-10-CM | POA: Diagnosis not present

## 2020-04-13 LAB — POCT URINALYSIS DIP (MANUAL ENTRY)
Bilirubin, UA: NEGATIVE
Blood, UA: NEGATIVE
Glucose, UA: 100 mg/dL — AB
Ketones, POC UA: NEGATIVE mg/dL
Nitrite, UA: NEGATIVE
Protein Ur, POC: NEGATIVE mg/dL
Spec Grav, UA: >=1.03 — AB (ref 1.010–1.025)
Urobilinogen, UA: 0.2 E.U./dL
pH, UA: 5.5 (ref 5.0–8.0)

## 2020-04-13 LAB — POCT FASTING CBG KUC MANUAL ENTRY: POCT Glucose (KUC): 135 mg/dL — AB (ref 70–99)

## 2020-04-13 MED ORDER — NITROFURANTOIN MONOHYD MACRO 100 MG PO CAPS: 100.0000 mg | ORAL_CAPSULE | Freq: Two times a day (BID) | ORAL | 0 refills | Status: DC

## 2020-04-13 NOTE — ED Provider Notes (Signed)
Mary Schaefer    CSN: [id number] Arrival date & time: 04/13/20  0901      History   Chief Complaint Chief Complaint  Patient presents with  . Flank Pain    HPI Mary Schaefer is a 59 y.o. female.   Patient presents with left flank pain x3 days.  The pain is constant, nagging, improves with Tylenol or ibuprofen, radiates around to abdomen.  She denies hematuria, dysuria, rash, lesions, fever, chills, pelvic pain, vaginal discharge, vomiting, diarrhea, constipation, or other symptoms.  Her medical history includes diabetes, hypertension, hypothyroidism, endometriosis, anxiety.  The history is provided by the patient.    Past Medical History:  Diagnosis Date  . Blood in stool   . Childhood asthma   . Diabetes mellitus without complication ([place])   . Endometriosis 1997  . Hypertension   . Thyroid disease     Patient Active Problem List   Diagnosis Date Noted  . Vitamin D deficiency 05/15/2019  . Type 2 diabetes mellitus with hyperglycemia, without long-term current use of insulin ([place]) 02/12/2019  . HLD (hyperlipidemia) 09/06/2017  . Essential hypertension 03/31/2016  . Acquired hypothyroidism 03/31/2016  . Endometriosis 03/31/2016  . Generalized anxiety disorder 03/31/2016    Past Surgical History:  Procedure Laterality Date  . CORNEAL TRANSPLANT    . TUBAL LIGATION  1998    OB History   No obstetric history on file.      Home Medications    Prior to Admission medications   Medication Sig Start Date End Date Taking? Authorizing Provider  acetaminophen (TYLENOL) 500 MG tablet Take 1,000 mg by mouth as needed for mild pain.   Yes [provider]  Ascorbic Acid (VITAMIN C PO) Take 1 tablet by mouth daily.   Yes [provider]  Calcium Carbonate-Vitamin D3 (CALCIUM 600/VITAMIN D) 600-400 MG-UNIT TABS Take by mouth.   Yes [provider]  cyanocobalamin 2000 MCG tablet Take 2,000 mcg by mouth 2 (two) times daily.   Yes  [provider]  levothyroxine (SYNTHROID) 112 MCG tablet Take 1 tablet (112 mcg total) by mouth daily. 02/27/20  Yes Baity, Coralie Keens, NP  lisinopril-hydrochlorothiazide (ZESTORETIC) 10-12.5 MG tablet Take 1 tablet by mouth daily. MUST SCHEDULE PHYSICAL EXAM 02/05/20  Yes Baity, Coralie Keens, NP  metFORMIN (GLUCOPHAGE-XR) 500 MG 24 hr tablet Take 1 tablet (500 mg total) by mouth 2 (two) times daily. MUST SCHEDULE PHYSICAL 02/04/20  Yes Jearld Fenton, NP  naproxen sodium (ALEVE) 220 MG tablet Take 440 mg by mouth as needed (pain).   Yes [provider]  ofloxacin (OCUFLOX) 0.3 % ophthalmic solution Apply 1 drop to eye daily.   Yes [provider]  Omega-3 Fatty Acids (FISH OIL) 1200 MG CAPS Take 1 capsule by mouth 2 (two) times daily.    Yes [provider]  doxycycline (VIBRAMYCIN) 100 MG capsule Take 1 capsule (100 mg total) by mouth 2 (two) times daily. 03/08/20   Faustino Congress, NP  Ferrous Sulfate (IRON) 325 (65 Fe) MG TABS Take 1 tablet (325 mg total) by mouth daily. 03/18/19   Ladene Artist, MD  glucose blood test strip Check sugar BID as needed. DX E11.9 02/21/19   Jearld Fenton, NP  hydrOXYzine (ATARAX/VISTARIL) 25 MG tablet Take 1 tablet (25 mg total) by mouth 3 (three) times daily as needed. 02/18/19   Jearld Fenton, NP  metroNIDAZOLE (FLAGYL) 500 MG tablet Take 1 tablet (500 mg total) by mouth 2 (two) times  daily. 03/08/20   Faustino Congress, NP  nitrofurantoin, macrocrystal-monohydrate, (MACROBID) 100 MG capsule Take 1 capsule (100 mg total) by mouth 2 (two) times daily. 04/13/20   Sharion Balloon, NP  prednisoLONE acetate (PRED FORTE) 1 % ophthalmic suspension INSTILL 1 DROP INTO LEFT EYE 4 TIMES A DAY 10/02/18   [provider]  rosuvastatin (CRESTOR) 10 MG tablet TAKE 1 TABLET BY MOUTH EVERY DAY 05/30/19   Jearld Fenton, NP  simvastatin (ZOCOR) 40 MG tablet Take 40 mg by mouth daily.    [provider]    Family History Family  History  Problem Relation Age of Onset  . Ovarian cancer Mother   . Hyperlipidemia Mother   . Alcohol abuse Father   . Hyperlipidemia Father   . Heart disease Father   . Stroke Father   . Hypertension Father   . Diabetes Sister   . Colon cancer Neg Hx   . Esophageal cancer Neg Hx     Social History Social History   Tobacco Use  . Smoking status: Never Smoker  . Smokeless tobacco: Never Used  Vaping Use  . Vaping Use: Never used  Substance Use Topics  . Alcohol use: Yes    Alcohol/week: 1.0 - 2.0 standard drink    Types: 1 - 2 Glasses of wine per week    Comment: rare  . Drug use: Not Currently     Allergies   Aspirin and Penicillins   Review of Systems Review of Systems  Constitutional: Negative for chills and fever.  HENT: Negative for ear pain and sore throat.   Eyes: Negative for pain and visual disturbance.  Respiratory: Negative for cough and shortness of breath.   Cardiovascular: Negative for chest pain and palpitations.  Gastrointestinal: Negative for abdominal pain, diarrhea, nausea and vomiting.  Genitourinary: Positive for flank pain. Negative for dysuria, hematuria, pelvic pain and vaginal discharge.  Musculoskeletal: Negative for arthralgias and back pain.  Skin: Negative for color change and rash.  Neurological: Negative for seizures and syncope.  All other systems reviewed and are negative.    Physical Exam Triage Vital Signs ED Triage Vitals  Enc Vitals Group     BP 04/13/20 0911 121/85     Pulse Rate 04/13/20 0911 82     Resp 04/13/20 0911 17     Temp 04/13/20 0911 98.5 F (36.9 C)     Temp Source 04/13/20 0911 Oral     SpO2 04/13/20 0911 96 %     Weight --      Height --      Head Circumference --      Peak Flow --      Pain Score 04/13/20 0907 4     Pain Loc --      Pain Edu? --      Excl. in [place]? --    No data found.  Updated Vital Signs BP 121/85 (BP Location: Left Arm)   Pulse 82   Temp 98.5 F (36.9 C) (Oral)   Resp 17    LMP 09/10/2016   SpO2 96%   Visual Acuity Right Eye Distance:   Left Eye Distance:   Bilateral Distance:    Right Eye Near:   Left Eye Near:    Bilateral Near:     Physical Exam Vitals and nursing note reviewed.  Constitutional:      General: She is not in acute distress.    Appearance: She is well-developed. She is not ill-appearing.  HENT:  Head: Normocephalic and atraumatic.     Mouth/Throat:     Mouth: Mucous membranes are moist.  Eyes:     Conjunctiva/sclera: Conjunctivae normal.  Cardiovascular:     Rate and Rhythm: Normal rate and regular rhythm.     Heart sounds: No murmur heard.   Pulmonary:     Effort: Pulmonary effort is normal. No respiratory distress.     Breath sounds: Normal breath sounds.  Abdominal:     General: Bowel sounds are normal.     Palpations: Abdomen is soft.     Tenderness: There is no abdominal tenderness. There is no right CVA tenderness, left CVA tenderness, guarding or rebound.  Musculoskeletal:     Cervical back: Neck supple.  Skin:    General: Skin is warm and dry.     Findings: No bruising, erythema, lesion or rash.  Neurological:     General: No focal deficit present.     Mental Status: She is alert and oriented to person, place, and time.     Gait: Gait normal.  Psychiatric:        Mood and Affect: Mood normal.        Behavior: Behavior normal.      UC Treatments / Results  Labs (all labs ordered are listed, but only abnormal results are displayed) Labs Reviewed  POCT URINALYSIS DIP (MANUAL ENTRY) - Abnormal; Notable for the following components:      Result Value   Clarity, UA cloudy (*)    Glucose, UA =100 (*)    Spec Grav, UA >=1.030 (*)    Leukocytes, UA Small (1+) (*)    All other components within normal limits  POCT FASTING CBG [facility] MANUAL ENTRY - Abnormal; Notable for the following components:   POCT Glucose ([facility]) 135 (*)    All other components within normal limits  URINE CULTURE     EKG   Radiology No results found.  Procedures Procedures (including critical care time)  Medications Ordered in UC Medications - No data to display  Initial Impression / Assessment and Plan / UC Course  I have reviewed the triage vital signs and the nursing notes.  Pertinent labs & imaging results that were available during my care of the patient were reviewed by me and considered in my medical decision making (see chart for details).   Left flank pain.  Urine culture pending.  Treating with Macrobid.  Instructed patient to increase her water intake.  Discussed that she has glucose in her urine today and that she needs to discuss this with her PCP.  Last A1c was 6.0 on 09/02/2019.  Instructed her to go to the ED if her pain persists or worsens or if she develops new symptoms such as fever.  Patient agrees to plan of care.   Final Clinical Impressions(s) / UC Diagnoses   Final diagnoses:  Left flank pain  Glucosuria     Discharge Instructions     Take the Macrobid as directed.  Increase your water intake.    Go to the emergency department if your pain persists or worsens.  Or if you develop new symptoms such as fever or other concerns.        ED Prescriptions    Medication Sig Dispense Auth. Provider   nitrofurantoin, macrocrystal-monohydrate, (MACROBID) 100 MG capsule Take 1 capsule (100 mg total) by mouth 2 (two) times daily. 10 capsule Sharion Balloon, NP     PDMP not reviewed this encounter.   Barkley Boards  H, NP 04/13/20 [phone]

## 2020-04-13 NOTE — Telephone Encounter (Signed)
Sorry had to cancel the physical. Can she schedule a 15 minute appt to discuss? We can work her in if need be.

## 2020-04-13 NOTE — ED Triage Notes (Signed)
Patient c/o LFT sided flank pain that radiates to the front of ABD since Saturday night. Patient states she's had periods of "sweating".   Patient has used Ibuprofen and Tylenol w/ some relief of symptoms.   Patient denies any urinary symptoms.    Patient denies any fever.  Patient denies pertinent history.

## 2020-04-13 NOTE — Discharge Instructions (Addendum)
Take the Macrobid as directed.  Increase your water intake.    Go to the emergency department if your pain persists or worsens.  Or if you develop new symptoms such as fever or other concerns.

## 2020-04-14 ENCOUNTER — Other Ambulatory Visit: Payer: Self-pay | Admitting: Internal Medicine

## 2020-04-14 LAB — URINE CULTURE

## 2020-04-15 DIAGNOSIS — R293 Abnormal posture: Secondary | ICD-10-CM | POA: Diagnosis not present

## 2020-04-15 DIAGNOSIS — M9902 Segmental and somatic dysfunction of thoracic region: Secondary | ICD-10-CM | POA: Diagnosis not present

## 2020-04-15 DIAGNOSIS — M62411 Contracture of muscle, right shoulder: Secondary | ICD-10-CM | POA: Diagnosis not present

## 2020-04-15 DIAGNOSIS — M62830 Muscle spasm of back: Secondary | ICD-10-CM | POA: Diagnosis not present

## 2020-04-15 DIAGNOSIS — M9901 Segmental and somatic dysfunction of cervical region: Secondary | ICD-10-CM | POA: Diagnosis not present

## 2020-04-15 DIAGNOSIS — M62412 Contracture of muscle, left shoulder: Secondary | ICD-10-CM | POA: Diagnosis not present

## 2020-04-19 ENCOUNTER — Other Ambulatory Visit (INDEPENDENT_AMBULATORY_CARE_PROVIDER_SITE_OTHER): Payer: BC Managed Care – PPO

## 2020-04-19 ENCOUNTER — Other Ambulatory Visit: Payer: Self-pay

## 2020-04-19 DIAGNOSIS — E1165 Type 2 diabetes mellitus with hyperglycemia: Secondary | ICD-10-CM

## 2020-04-19 LAB — POCT GLYCOSYLATED HEMOGLOBIN (HGB A1C): Hemoglobin A1C: 7.1 % — AB (ref 4.0–5.6)

## 2020-04-21 DIAGNOSIS — M62412 Contracture of muscle, left shoulder: Secondary | ICD-10-CM | POA: Diagnosis not present

## 2020-04-21 DIAGNOSIS — R293 Abnormal posture: Secondary | ICD-10-CM | POA: Diagnosis not present

## 2020-04-21 DIAGNOSIS — M62411 Contracture of muscle, right shoulder: Secondary | ICD-10-CM | POA: Diagnosis not present

## 2020-04-21 DIAGNOSIS — M9901 Segmental and somatic dysfunction of cervical region: Secondary | ICD-10-CM | POA: Diagnosis not present

## 2020-04-21 DIAGNOSIS — M9902 Segmental and somatic dysfunction of thoracic region: Secondary | ICD-10-CM | POA: Diagnosis not present

## 2020-04-21 DIAGNOSIS — M62830 Muscle spasm of back: Secondary | ICD-10-CM | POA: Diagnosis not present

## 2020-04-22 DIAGNOSIS — M9902 Segmental and somatic dysfunction of thoracic region: Secondary | ICD-10-CM | POA: Diagnosis not present

## 2020-04-22 DIAGNOSIS — M9901 Segmental and somatic dysfunction of cervical region: Secondary | ICD-10-CM | POA: Diagnosis not present

## 2020-04-22 DIAGNOSIS — R293 Abnormal posture: Secondary | ICD-10-CM | POA: Diagnosis not present

## 2020-04-22 DIAGNOSIS — M62830 Muscle spasm of back: Secondary | ICD-10-CM | POA: Diagnosis not present

## 2020-04-22 DIAGNOSIS — M62411 Contracture of muscle, right shoulder: Secondary | ICD-10-CM | POA: Diagnosis not present

## 2020-04-22 DIAGNOSIS — M62412 Contracture of muscle, left shoulder: Secondary | ICD-10-CM | POA: Diagnosis not present

## 2020-04-26 ENCOUNTER — Encounter: Payer: Self-pay | Admitting: Internal Medicine

## 2020-04-26 ENCOUNTER — Ambulatory Visit (INDEPENDENT_AMBULATORY_CARE_PROVIDER_SITE_OTHER): Payer: BC Managed Care – PPO | Admitting: Internal Medicine

## 2020-04-26 ENCOUNTER — Other Ambulatory Visit: Payer: Self-pay

## 2020-04-26 VITALS — BP 122/76 | HR 93 | Temp 98.2°F | Wt 182.0 lb

## 2020-04-26 DIAGNOSIS — F419 Anxiety disorder, unspecified: Secondary | ICD-10-CM

## 2020-04-26 DIAGNOSIS — M9901 Segmental and somatic dysfunction of cervical region: Secondary | ICD-10-CM | POA: Diagnosis not present

## 2020-04-26 DIAGNOSIS — M62412 Contracture of muscle, left shoulder: Secondary | ICD-10-CM | POA: Diagnosis not present

## 2020-04-26 DIAGNOSIS — R293 Abnormal posture: Secondary | ICD-10-CM | POA: Diagnosis not present

## 2020-04-26 DIAGNOSIS — M62411 Contracture of muscle, right shoulder: Secondary | ICD-10-CM | POA: Diagnosis not present

## 2020-04-26 DIAGNOSIS — R5383 Other fatigue: Secondary | ICD-10-CM | POA: Diagnosis not present

## 2020-04-26 DIAGNOSIS — M9902 Segmental and somatic dysfunction of thoracic region: Secondary | ICD-10-CM | POA: Diagnosis not present

## 2020-04-26 DIAGNOSIS — E039 Hypothyroidism, unspecified: Secondary | ICD-10-CM

## 2020-04-26 DIAGNOSIS — M62830 Muscle spasm of back: Secondary | ICD-10-CM | POA: Diagnosis not present

## 2020-04-26 LAB — TSH: TSH: 1.75 u[IU]/mL (ref 0.35–4.50)

## 2020-04-26 LAB — VITAMIN D 25 HYDROXY (VIT D DEFICIENCY, FRACTURES): VITD: 24.86 ng/mL — ABNORMAL LOW (ref 30.00–100.00)

## 2020-04-26 LAB — T4, FREE: Free T4: 1.16 ng/dL (ref 0.60–1.60)

## 2020-04-26 LAB — VITAMIN B12: Vitamin B-12: 804 pg/mL (ref 211–911)

## 2020-04-26 MED ORDER — BUSPIRONE HCL 5 MG PO TABS: 5.0000 mg | ORAL_TABLET | Freq: Two times a day (BID) | ORAL | 0 refills | Status: DC | PRN

## 2020-04-26 NOTE — Progress Notes (Signed)
Subjective:    Patient ID: Mary Schaefer, female    DOB: [birth year], 59 y.o.   MRN: [id number]  HPI  Pt presents to the clinic today with c/o anxiety. She noticed this 2-3 months ago. She reports fatigue, feeling hot all the time, feeling on edge, chest tightness and more moody. She is having more trouble sleeping. She has no idea what is triggering this. She takes Hydroxyzine as needed but does not feel like it is effective. She was on antidepressants in the past, but does not like the way they made her feel. She is not currently seeing a therapist. She denies depression, SI/HI.   Review of Systems      Past Medical History:  Diagnosis Date  . Blood in stool   . Childhood asthma   . Diabetes mellitus without complication ([place])   . Endometriosis 1997  . Hypertension   . Thyroid disease     Current Outpatient Medications  Medication Sig Dispense Refill  . acetaminophen (TYLENOL) 500 MG tablet Take 1,000 mg by mouth as needed for mild pain.    . Ascorbic Acid (VITAMIN C PO) Take 1 tablet by mouth daily.    . Calcium Carbonate-Vitamin D3 (CALCIUM 600/VITAMIN D) 600-400 MG-UNIT TABS Take by mouth.    . cyanocobalamin 2000 MCG tablet Take 2,000 mcg by mouth 2 (two) times daily.    [name] doxycycline (VIBRAMYCIN) 100 MG capsule Take 1 capsule (100 mg total) by mouth 2 (two) times daily. 14 capsule 0  . Ferrous Sulfate (IRON) 325 (65 Fe) MG TABS Take 1 tablet (325 mg total) by mouth daily. 30 tablet 0  . glucose blood test strip Check sugar BID as needed. DX E11.9 100 each 6  . hydrOXYzine (ATARAX/VISTARIL) 25 MG tablet TAKE 1 TABLET BY MOUTH THREE TIMES A DAY AS NEEDED 30 tablet 2  . levothyroxine (SYNTHROID) 112 MCG tablet Take 1 tablet (112 mcg total) by mouth daily. 90 tablet 0  . lisinopril-hydrochlorothiazide (ZESTORETIC) 10-12.5 MG tablet Take 1 tablet by mouth daily. MUST SCHEDULE PHYSICAL EXAM 90 tablet 0  . metFORMIN (GLUCOPHAGE-XR) 500 MG 24 hr tablet Take 1 tablet (500 mg  total) by mouth 2 (two) times daily. MUST SCHEDULE PHYSICAL 180 tablet 0  . metroNIDAZOLE (FLAGYL) 500 MG tablet Take 1 tablet (500 mg total) by mouth 2 (two) times daily. 14 tablet 0  . naproxen sodium (ALEVE) 220 MG tablet Take 440 mg by mouth as needed (pain).    . nitrofurantoin, macrocrystal-monohydrate, (MACROBID) 100 MG capsule Take 1 capsule (100 mg total) by mouth 2 (two) times daily. 10 capsule 0  . ofloxacin (OCUFLOX) 0.3 % ophthalmic solution Apply 1 drop to eye daily.    . Omega-3 Fatty Acids (FISH OIL) 1200 MG CAPS Take 1 capsule by mouth 2 (two) times daily.     . prednisoLONE acetate (PRED FORTE) 1 % ophthalmic suspension INSTILL 1 DROP INTO LEFT EYE 4 TIMES A DAY    . rosuvastatin (CRESTOR) 10 MG tablet TAKE 1 TABLET BY MOUTH EVERY DAY 90 tablet 0  . simvastatin (ZOCOR) 40 MG tablet Take 40 mg by mouth daily.     No current facility-administered medications for this visit.    Allergies  Allergen Reactions  . Aspirin Shortness Of Breath and Hives  . Penicillins Hives    Family History  Problem Relation Age of Onset  . Ovarian cancer Mother   . Hyperlipidemia Mother   . Alcohol abuse Father   . Hyperlipidemia Father   .  Heart disease Father   . Stroke Father   . Hypertension Father   . Diabetes Sister   . Colon cancer Neg Hx   . Esophageal cancer Neg Hx     Social History   Socioeconomic History  . Marital status: Married    Spouse name: Not on file  . Number of children: Not on file  . Years of education: Not on file  . Highest education level: Not on file  Occupational History  . Not on file  Tobacco Use  . Smoking status: Never Smoker  . Smokeless tobacco: Never Used  Vaping Use  . Vaping Use: Never used  Substance and Sexual Activity  . Alcohol use: Yes    Alcohol/week: 1.0 - 2.0 standard drink    Types: 1 - 2 Glasses of wine per week    Comment: rare  . Drug use: Not Currently  . Sexual activity: Yes  Other Topics Concern  . Not on file    Social History Narrative  . Not on file   Social Determinants of Health   Financial Resource Strain:   . Difficulty of Paying Living Expenses: Not on file  Food Insecurity:   . Worried About Charity fundraiser in the Last Year: Not on file  . Ran Out of Food in the Last Year: Not on file  Transportation Needs:   . Lack of Transportation (Medical): Not on file  . Lack of Transportation (Non-Medical): Not on file  Physical Activity:   . Days of Exercise per Week: Not on file  . Minutes of Exercise per Session: Not on file  Stress:   . Feeling of Stress : Not on file  Social Connections:   . Frequency of Communication with Friends and Family: Not on file  . Frequency of Social Gatherings with Friends and Family: Not on file  . Attends Religious Services: Not on file  . Active Member of Clubs or Organizations: Not on file  . Attends Archivist Meetings: Not on file  . Marital Status: Not on file  Intimate Partner Violence:   . Fear of Current or Ex-Partner: Not on file  . Emotionally Abused: Not on file  . Physically Abused: Not on file  . Sexually Abused: Not on file     Constitutional: Pt reports fatigue, weight gain. Denies fever, malaise, headache.  Respiratory: Denies difficulty breathing, shortness of breath, cough or sputum production.   Cardiovascular: Denies chest pain, chest tightness, palpitations or swelling in the hands or feet.  Psych: Pt reports anxiety. Denies depression, SI/HI.  No other specific complaints in a complete review of systems (except as listed in HPI above).  Objective:   Physical Exam  BP 122/76   Pulse 93   Temp 98.2 F (36.8 C) (Temporal)   Wt 182 lb (82.6 kg)   LMP 09/10/2016   SpO2 98%   BMI 28.94 kg/m   Wt Readings from Last 3 Encounters:  06/24/19 162 lb (73.5 kg)  05/15/19 165 lb (74.8 kg)  03/18/19 174 lb 6 oz (79.1 kg)    General: Appears her stated age, well developed, well nourished in NAD. Neck:  Neck  supple, trachea midline. No masses, lumps present.  Cardiovascular: Normal rate. Pulmonary/Chest: Normal effort. Neurological: Alert and oriented.  Psychiatric: Mood and affect normal. Behavior is normal. Judgment and thought content normal.   BMET    Component Value Date/Time   NA 141 05/15/2019 0905   K 4.3 05/15/2019 0905  CL 102 05/15/2019 0905   CO2 29 05/15/2019 0905   GLUCOSE 93 05/15/2019 0905   BUN 17 05/15/2019 0905   CREATININE 0.69 05/15/2019 0905   CREATININE 0.83 05/24/2018 1609   CALCIUM 10.0 05/15/2019 0905   GFRNONAA >60 09/17/2017 0732   GFRAA >60 09/17/2017 0732    Lipid Panel     Component Value Date/Time   CHOL 237 (H) 09/02/2019 0809   TRIG 216.0 (H) 09/02/2019 0809   HDL 43.40 09/02/2019 0809   CHOLHDL 5 09/02/2019 0809   VLDL 43.2 (H) 09/02/2019 0809   LDLCALC 122 (H) 05/15/2019 0905   LDLCALC 219 (H) 05/24/2018 1609    CBC    Component Value Date/Time   WBC 5.8 02/12/2019 1508   RBC 5.07 02/12/2019 1508   HGB 15.2 (H) 02/12/2019 1508   HCT 45.4 02/12/2019 1508   PLT 277.0 02/12/2019 1508   MCV 89.6 02/12/2019 1508   MCH 29.7 09/17/2017 0732   MCHC 33.6 02/12/2019 1508   RDW 13.3 02/12/2019 1508    Hgb A1C Lab Results  Component Value Date   HGBA1C 7.1 (A) 04/19/2020           Assessment & Plan:   Fatigue, GAD, Hypothyroidism:  Will check TSH, Free T4, Vit D and B12 today Will trial Buspar 5 mg PO BID prn She can continue Hydroxyzine as needed, but if you feel it is ineffective, you may want to consider stop taking this She is not interested in seeing a therapist at this time  Will follow up after labs, update me in a few weeks and let me know how your anxiety is doing  Webb Silversmith, NP This visit occurred during the SARS-CoV-2 public health emergency.  Safety protocols were in place, including screening questions prior to the visit, additional usage of staff PPE, and extensive cleaning of exam room while observing  appropriate contact time as indicated for disinfecting solutions.

## 2020-04-26 NOTE — Patient Instructions (Signed)

## 2020-04-27 ENCOUNTER — Encounter: Payer: Self-pay | Admitting: Internal Medicine

## 2020-05-07 ENCOUNTER — Other Ambulatory Visit: Payer: Self-pay | Admitting: Internal Medicine

## 2020-05-07 DIAGNOSIS — I10 Essential (primary) hypertension: Secondary | ICD-10-CM

## 2020-05-10 DIAGNOSIS — R293 Abnormal posture: Secondary | ICD-10-CM | POA: Diagnosis not present

## 2020-05-10 DIAGNOSIS — M62412 Contracture of muscle, left shoulder: Secondary | ICD-10-CM | POA: Diagnosis not present

## 2020-05-10 DIAGNOSIS — M9901 Segmental and somatic dysfunction of cervical region: Secondary | ICD-10-CM | POA: Diagnosis not present

## 2020-05-10 DIAGNOSIS — M62830 Muscle spasm of back: Secondary | ICD-10-CM | POA: Diagnosis not present

## 2020-05-10 DIAGNOSIS — M62411 Contracture of muscle, right shoulder: Secondary | ICD-10-CM | POA: Diagnosis not present

## 2020-05-10 DIAGNOSIS — M9902 Segmental and somatic dysfunction of thoracic region: Secondary | ICD-10-CM | POA: Diagnosis not present

## 2020-05-12 DIAGNOSIS — M62830 Muscle spasm of back: Secondary | ICD-10-CM | POA: Diagnosis not present

## 2020-05-12 DIAGNOSIS — R293 Abnormal posture: Secondary | ICD-10-CM | POA: Diagnosis not present

## 2020-05-12 DIAGNOSIS — M62411 Contracture of muscle, right shoulder: Secondary | ICD-10-CM | POA: Diagnosis not present

## 2020-05-12 DIAGNOSIS — M62412 Contracture of muscle, left shoulder: Secondary | ICD-10-CM | POA: Diagnosis not present

## 2020-05-12 DIAGNOSIS — M9901 Segmental and somatic dysfunction of cervical region: Secondary | ICD-10-CM | POA: Diagnosis not present

## 2020-05-12 DIAGNOSIS — M9902 Segmental and somatic dysfunction of thoracic region: Secondary | ICD-10-CM | POA: Diagnosis not present

## 2020-05-16 ENCOUNTER — Other Ambulatory Visit: Payer: Self-pay | Admitting: Internal Medicine

## 2020-05-16 DIAGNOSIS — E039 Hypothyroidism, unspecified: Secondary | ICD-10-CM

## 2020-05-17 DIAGNOSIS — M9901 Segmental and somatic dysfunction of cervical region: Secondary | ICD-10-CM | POA: Diagnosis not present

## 2020-05-17 DIAGNOSIS — Z947 Corneal transplant status: Secondary | ICD-10-CM | POA: Diagnosis not present

## 2020-05-17 DIAGNOSIS — M62830 Muscle spasm of back: Secondary | ICD-10-CM | POA: Diagnosis not present

## 2020-05-17 DIAGNOSIS — M9902 Segmental and somatic dysfunction of thoracic region: Secondary | ICD-10-CM | POA: Diagnosis not present

## 2020-05-17 DIAGNOSIS — M62411 Contracture of muscle, right shoulder: Secondary | ICD-10-CM | POA: Diagnosis not present

## 2020-05-17 DIAGNOSIS — R293 Abnormal posture: Secondary | ICD-10-CM | POA: Diagnosis not present

## 2020-05-17 DIAGNOSIS — M62412 Contracture of muscle, left shoulder: Secondary | ICD-10-CM | POA: Diagnosis not present

## 2020-05-18 ENCOUNTER — Other Ambulatory Visit: Payer: Self-pay | Admitting: Internal Medicine

## 2020-05-19 NOTE — Telephone Encounter (Signed)
Does she want to do 7.5 or 10 mg?

## 2020-06-14 ENCOUNTER — Other Ambulatory Visit: Payer: BC Managed Care – PPO

## 2020-06-14 DIAGNOSIS — Z20822 Contact with and (suspected) exposure to covid-19: Secondary | ICD-10-CM

## 2020-06-15 LAB — NOVEL CORONAVIRUS, NAA: SARS-CoV-2, NAA: NOT DETECTED

## 2020-06-15 LAB — SARS-COV-2, NAA 2 DAY TAT

## 2020-06-19 ENCOUNTER — Other Ambulatory Visit: Payer: Self-pay | Admitting: Internal Medicine

## 2020-06-28 ENCOUNTER — Encounter: Payer: BC Managed Care – PPO | Admitting: Internal Medicine

## 2020-07-01 ENCOUNTER — Other Ambulatory Visit: Payer: BC Managed Care – PPO

## 2020-07-01 ENCOUNTER — Other Ambulatory Visit: Payer: Self-pay

## 2020-07-01 DIAGNOSIS — Z20822 Contact with and (suspected) exposure to covid-19: Secondary | ICD-10-CM | POA: Diagnosis not present

## 2020-07-03 LAB — NOVEL CORONAVIRUS, NAA: SARS-CoV-2, NAA: NOT DETECTED

## 2020-07-03 LAB — SARS-COV-2, NAA 2 DAY TAT

## 2020-07-05 ENCOUNTER — Encounter: Payer: Self-pay | Admitting: Internal Medicine

## 2020-07-05 DIAGNOSIS — E119 Type 2 diabetes mellitus without complications: Secondary | ICD-10-CM | POA: Diagnosis not present

## 2020-07-05 DIAGNOSIS — Z947 Corneal transplant status: Secondary | ICD-10-CM | POA: Diagnosis not present

## 2020-07-05 DIAGNOSIS — H52213 Irregular astigmatism, bilateral: Secondary | ICD-10-CM | POA: Diagnosis not present

## 2020-07-05 DIAGNOSIS — H524 Presbyopia: Secondary | ICD-10-CM | POA: Diagnosis not present

## 2020-07-05 DIAGNOSIS — H5213 Myopia, bilateral: Secondary | ICD-10-CM | POA: Diagnosis not present

## 2020-07-05 LAB — HM DIABETES EYE EXAM

## 2020-07-15 ENCOUNTER — Ambulatory Visit (INDEPENDENT_AMBULATORY_CARE_PROVIDER_SITE_OTHER): Payer: BC Managed Care – PPO | Admitting: Internal Medicine

## 2020-07-15 ENCOUNTER — Other Ambulatory Visit: Payer: Self-pay

## 2020-07-15 ENCOUNTER — Encounter: Payer: Self-pay | Admitting: Internal Medicine

## 2020-07-15 VITALS — BP 120/78 | HR 107 | Temp 98.2°F | Ht 66.0 in | Wt 183.0 lb

## 2020-07-15 DIAGNOSIS — E039 Hypothyroidism, unspecified: Secondary | ICD-10-CM

## 2020-07-15 DIAGNOSIS — K219 Gastro-esophageal reflux disease without esophagitis: Secondary | ICD-10-CM

## 2020-07-15 DIAGNOSIS — D508 Other iron deficiency anemias: Secondary | ICD-10-CM | POA: Diagnosis not present

## 2020-07-15 DIAGNOSIS — F411 Generalized anxiety disorder: Secondary | ICD-10-CM | POA: Diagnosis not present

## 2020-07-15 DIAGNOSIS — I10 Essential (primary) hypertension: Secondary | ICD-10-CM

## 2020-07-15 DIAGNOSIS — Z0000 Encounter for general adult medical examination without abnormal findings: Secondary | ICD-10-CM | POA: Diagnosis not present

## 2020-07-15 DIAGNOSIS — Z0001 Encounter for general adult medical examination with abnormal findings: Secondary | ICD-10-CM

## 2020-07-15 DIAGNOSIS — E1165 Type 2 diabetes mellitus with hyperglycemia: Secondary | ICD-10-CM | POA: Diagnosis not present

## 2020-07-15 DIAGNOSIS — E782 Mixed hyperlipidemia: Secondary | ICD-10-CM

## 2020-07-15 NOTE — Progress Notes (Signed)
Subjective:    Patient ID: Mary Schaefer, female    DOB: 10/11/1960, 61 y.o.   MRN: [id number]  HPI  Pt presents to the clinic today for her annual exam. She is also due to follow up chronic conditions.  HTN: Her BP today is 120/78.  She is taking Lisinopril HCT as prescribed.  There is no ECG on file.  HLD: Her last LDL was 154, triglycerides 212, 08/2019.  She is not currently taking rosuvastatin as prescribed.  She tries to consume a low-fat diet.  GERD: Intermittent, managed with Tums as needed with good relief of symptoms.  There is no upper GI on file.  GAD: Intermittent, managed with Buspar and Hydroxyzine as needed.  She is not currently seeing a therapist.  She denies depression, SI/HI.  DM 2: Her last A1c was 7.1, 11/202.  She is taking Metformin as prescribed.  She checks her feet routinely.  She gets eye exams routinely.  Hypothyroidism: She denies any issues on her current dose of levothyroxine.  She is not following with endocrinology.  Iron Deficiency Anemia: Her last H/H was 14.8/44, 02/2019. She is currently taking an iron supplement.  Flu: 04/2020 Tetanus: 01/2014 Pneumovax: 02/2019 Covid: Pfizer x3 Pap Smear: 06/2019 Mammogram: 02/2020 Bone Density: Never Colon Screening: 04/2013 Vision Screening: Annually Dentist: Annually  Diet: She does eat meat.  She consumes fruits and vegetables daily.  She tries to avoid fried foods. Exercise: Walking  Review of Systems      Past Medical History:  Diagnosis Date  . Blood in stool   . Childhood asthma   . Diabetes mellitus without complication ([place])   . Endometriosis 1997  . Hypertension   . Thyroid disease     Current Outpatient Medications  Medication Sig Dispense Refill  . acetaminophen (TYLENOL) 500 MG tablet Take 1,000 mg by mouth as needed for mild pain.    . Ascorbic Acid (VITAMIN C PO) Take 1 tablet by mouth daily.    . busPIRone (BUSPAR) 7.5 MG tablet TAKE 1 TABLET BY MOUTH 2 TIMES DAILY. 180  tablet 0  . Calcium Carbonate-Vitamin D3 (CALCIUM 600/VITAMIN D) 600-400 MG-UNIT TABS Take by mouth.    . cyanocobalamin 2000 MCG tablet Take 2,000 mcg by mouth 2 (two) times daily.    . Ferrous Sulfate (IRON) 325 (65 Fe) MG TABS Take 1 tablet (325 mg total) by mouth daily. 30 tablet 0  . glucose blood test strip Check sugar BID as needed. DX E11.9 100 each 6  . hydrOXYzine (ATARAX/VISTARIL) 25 MG tablet TAKE 1 TABLET BY MOUTH THREE TIMES A DAY AS NEEDED 30 tablet 2  . levothyroxine (SYNTHROID) 112 MCG tablet TAKE 1 TABLET BY MOUTH EVERY DAY 90 tablet 0  . lisinopril-hydrochlorothiazide (ZESTORETIC) 10-12.5 MG tablet TAKE 1 TABLET BY MOUTH DAILY. MUST SCHEDULE PHYSICAL EXAM 90 tablet 0  . metFORMIN (GLUCOPHAGE-XR) 500 MG 24 hr tablet TAKE 1 TABLET (500 MG TOTAL) BY MOUTH 2 (TWO) TIMES DAILY. MUST SCHEDULE PHYSICAL 180 tablet 0  . naproxen sodium (ALEVE) 220 MG tablet Take 440 mg by mouth as needed (pain).    [name] ofloxacin (OCUFLOX) 0.3 % ophthalmic solution Apply 1 drop to eye daily.    . Omega-3 Fatty Acids (FISH OIL) 1200 MG CAPS Take 1 capsule by mouth 2 (two) times daily.     . prednisoLONE acetate (PRED FORTE) 1 % ophthalmic suspension INSTILL 1 DROP INTO LEFT EYE 4 TIMES A DAY    . rosuvastatin (CRESTOR) 10 MG  tablet TAKE 1 TABLET BY MOUTH EVERY DAY 90 tablet 0  . simvastatin (ZOCOR) 40 MG tablet Take 40 mg by mouth daily.     No current facility-administered medications for this visit.    Allergies  Allergen Reactions  . Aspirin Shortness Of Breath and Hives  . Penicillins Hives    Family History  Problem Relation Age of Onset  . Ovarian cancer Mother   . Hyperlipidemia Mother   . Alcohol abuse Father   . Hyperlipidemia Father   . Heart disease Father   . Stroke Father   . Hypertension Father   . Diabetes Sister   . Colon cancer Neg Hx   . Esophageal cancer Neg Hx     Social History   Socioeconomic History  . Marital status: Married    Spouse name: Not on file  .  Number of children: Not on file  . Years of education: Not on file  . Highest education level: Not on file  Occupational History  . Not on file  Tobacco Use  . Smoking status: Never Smoker  . Smokeless tobacco: Never Used  Vaping Use  . Vaping Use: Never used  Substance and Sexual Activity  . Alcohol use: Yes    Alcohol/week: 1.0 - 2.0 standard drink    Types: 1 - 2 Glasses of wine per week    Comment: rare  . Drug use: Not Currently  . Sexual activity: Yes  Other Topics Concern  . Not on file  Social History Narrative  . Not on file   Social Determinants of Health   Financial Resource Strain: Not on file  Food Insecurity: Not on file  Transportation Needs: Not on file  Physical Activity: Not on file  Stress: Not on file  Social Connections: Not on file  Intimate Partner Violence: Not on file     Constitutional: Denies fever, malaise, fatigue, headache or abrupt weight changes.  HEENT: Denies eye pain, eye redness, ear pain, ringing in the ears, wax buildup, runny nose, nasal congestion, bloody nose, or sore throat. Respiratory: Denies difficulty breathing, shortness of breath, cough or sputum production.   Cardiovascular: Denies chest pain, chest tightness, palpitations or swelling in the hands or feet.  Gastrointestinal: Patient reports intermittent reflux.  Denies abdominal pain, bloating, constipation, diarrhea or blood in the stool.  GU: Denies urgency, frequency, pain with urination, burning sensation, blood in urine, odor or discharge. Musculoskeletal: Denies decrease in range of motion, difficulty with gait, muscle pain or joint pain and swelling.  Skin: Denies redness, rashes, lesions or ulcercations.  Neurological: Denies dizziness, difficulty with memory, difficulty with speech or problems with balance and coordination.  Psych: Patient has a history of anxiety.  Denies depression, SI/HI.  No other specific complaints in a complete review of systems (except as  listed in HPI above).  Objective:   Physical Exam  BP 120/78   Pulse (!) 107   Temp 98.2 F (36.8 C) (Temporal)   Ht $R'5\' 6"'lC$  (1.676 m)   Wt 183 lb (83 kg)   LMP 09/10/2016   SpO2 97%   BMI 29.54 kg/m   Wt Readings from Last 3 Encounters:  04/26/20 182 lb (82.6 kg)  06/24/19 162 lb (73.5 kg)  05/15/19 165 lb (74.8 kg)    General: Appears her stated age, well developed, well nourished in NAD. Skin: Warm, dry and intact. No ulcerations noted. HEENT: Head: normal shape and size; Eyes: sclera white, no icterus, conjunctiva pink, PERRLA and EOMs intact;  Neck:  Neck supple, trachea midline. No masses, lumps or thyromegaly present.  Cardiovascular: Normal rate and rhythm. S1,S2 noted.  No murmur, rubs or gallops noted. No JVD or BLE edema. No carotid bruits noted. Pulmonary/Chest: Normal effort and positive vesicular breath sounds. No respiratory distress. No wheezes, rales or ronchi noted.  Abdomen: Soft and nontender. Normal bowel sounds. No distention or masses noted. Liver, spleen and kidneys non palpable. Musculoskeletal: Strength 5/5 BUE/BLE.  No difficulty with gait.  Neurological: Alert and oriented. Cranial nerves II-XII grossly intact. Coordination normal.  Psychiatric: Mood and affect normal. Behavior is normal. Judgment and thought content normal.     BMET    Component Value Date/Time   NA 141 05/15/2019 0905   K 4.3 05/15/2019 0905   CL 102 05/15/2019 0905   CO2 29 05/15/2019 0905   GLUCOSE 93 05/15/2019 0905   BUN 17 05/15/2019 0905   CREATININE 0.69 05/15/2019 0905   CREATININE 0.83 05/24/2018 1609   CALCIUM 10.0 05/15/2019 0905   GFRNONAA >60 09/17/2017 0732   GFRAA >60 09/17/2017 0732    Lipid Panel     Component Value Date/Time   CHOL 237 (H) 09/02/2019 0809   TRIG 216.0 (H) 09/02/2019 0809   HDL 43.40 09/02/2019 0809   CHOLHDL 5 09/02/2019 0809   VLDL 43.2 (H) 09/02/2019 0809   LDLCALC 122 (H) 05/15/2019 0905   LDLCALC 219 (H) 05/24/2018 1609     CBC    Component Value Date/Time   WBC 5.8 02/12/2019 1508   RBC 5.07 02/12/2019 1508   HGB 15.2 (H) 02/12/2019 1508   HCT 45.4 02/12/2019 1508   PLT 277.0 02/12/2019 1508   MCV 89.6 02/12/2019 1508   MCH 29.7 09/17/2017 0732   MCHC 33.6 02/12/2019 1508   RDW 13.3 02/12/2019 1508    Hgb A1C Lab Results  Component Value Date   HGBA1C 7.1 (A) 04/19/2020            Assessment & Plan:  Preventative Health Maintenance:  Flu shot UTD Tetanus UTD COVID UTD Pap smear UTD Mammogram UTD We will discuss bone density at next annual exam Colon screening UTD Encouraged her to consume a balanced diet and exercise regimen Advised her to see an eye doctor and dentist annually We will check CBC, c-Met, TSH, free T4, lipid, A1c, IBC panel and ferritin  RTC in 6 months, follow-up chronic conditions   Webb Silversmith, NP This visit occurred during the SARS-CoV-2 public health emergency.  Safety protocols were in place, including screening questions prior to the visit, additional usage of staff PPE, and extensive cleaning of exam room while observing appropriate contact time as indicated for disinfecting solutions.

## 2020-07-16 LAB — CBC
HCT: 44 % (ref 36.0–46.0)
Hemoglobin: 14.8 g/dL (ref 12.0–15.0)
MCHC: 33.7 g/dL (ref 30.0–36.0)
MCV: 90.6 fl (ref 78.0–100.0)
Platelets: 297 10*3/uL (ref 150.0–400.0)
RBC: 4.86 Mil/uL (ref 3.87–5.11)
RDW: 12.8 % (ref 11.5–15.5)
WBC: 8 10*3/uL (ref 4.0–10.5)

## 2020-07-16 LAB — TSH: TSH: 5.38 u[IU]/mL — ABNORMAL HIGH (ref 0.35–4.50)

## 2020-07-16 LAB — T4, FREE: Free T4: 1.1 ng/dL (ref 0.60–1.60)

## 2020-07-16 LAB — COMPREHENSIVE METABOLIC PANEL
ALT: 105 U/L — ABNORMAL HIGH (ref 0–35)
AST: 48 U/L — ABNORMAL HIGH (ref 0–37)
Albumin: 4.7 g/dL (ref 3.5–5.2)
Alkaline Phosphatase: 73 U/L (ref 39–117)
BUN: 17 mg/dL (ref 6–23)
CO2: 25 mEq/L (ref 19–32)
Calcium: 10 mg/dL (ref 8.4–10.5)
Chloride: 98 mEq/L (ref 96–112)
Creatinine, Ser: 0.89 mg/dL (ref 0.40–1.20)
GFR: 71.39 mL/min (ref >60.00)
Glucose, Bld: 207 mg/dL — ABNORMAL HIGH (ref 70–99)
Potassium: 4.1 mEq/L (ref 3.5–5.1)
Sodium: 135 mEq/L (ref 135–145)
Total Bilirubin: 0.4 mg/dL (ref 0.2–1.2)
Total Protein: 7.3 g/dL (ref 6.0–8.3)

## 2020-07-16 LAB — LIPID PANEL
Cholesterol: 326 mg/dL — ABNORMAL HIGH (ref 0–200)
HDL: 47.2 mg/dL (ref >39.00)
Total CHOL/HDL Ratio: 7
Triglycerides: 421 mg/dL — ABNORMAL HIGH (ref 0.0–149.0)

## 2020-07-16 LAB — IBC PANEL
Iron: 95 ug/dL (ref 42–145)
Saturation Ratios: 24.1 % (ref 20.0–50.0)
Transferrin: 282 mg/dL (ref 212.0–360.0)

## 2020-07-16 LAB — FERRITIN: Ferritin: 297.2 ng/mL — ABNORMAL HIGH (ref 10.0–291.0)

## 2020-07-16 LAB — HEMOGLOBIN A1C: Hgb A1c MFr Bld: 5.6 % (ref 4.6–6.5)

## 2020-07-16 LAB — LDL CHOLESTEROL, DIRECT: Direct LDL: 206 mg/dL

## 2020-07-19 ENCOUNTER — Encounter: Payer: Self-pay | Admitting: Internal Medicine

## 2020-07-19 DIAGNOSIS — K219 Gastro-esophageal reflux disease without esophagitis: Secondary | ICD-10-CM | POA: Insufficient documentation

## 2020-07-19 NOTE — Assessment & Plan Note (Signed)
A1c today No urine microalbumin secondary to ACEI therapy Continue Metformin Encouraged her to consume a low carb diet and exercise weight loss Encourage routine eye exams Encourage routine foot exams Flu, Covid and Pneumovax UTD

## 2020-07-19 NOTE — Patient Instructions (Signed)

## 2020-07-19 NOTE — Assessment & Plan Note (Signed)
C-Met and lipid profile today If LDL >100 we will need to restart statin therapy Encouraged her to consume a low-fat diet 

## 2020-07-19 NOTE — Assessment & Plan Note (Addendum)
TSH and free T4 today We will adjust Levothyroxine if needed based on labs 

## 2020-07-19 NOTE — Assessment & Plan Note (Signed)
Continue BuSpar and Hydroxyzine as needed Support offered

## 2020-07-19 NOTE — Assessment & Plan Note (Signed)
Continue Tums as needed CBC today

## 2020-07-19 NOTE — Assessment & Plan Note (Signed)
Continue Lisinopril HCT Reinforced DASH diet and exercise for weight loss C met today

## 2020-07-20 ENCOUNTER — Encounter: Payer: Self-pay | Admitting: Internal Medicine

## 2020-07-20 MED ORDER — ROSUVASTATIN CALCIUM 10 MG PO TABS: 10.0000 mg | ORAL_TABLET | Freq: Every day | ORAL | 0 refills | Status: DC

## 2020-07-26 ENCOUNTER — Encounter: Payer: Self-pay | Admitting: Internal Medicine

## 2020-07-28 MED ORDER — GABAPENTIN 100 MG PO CAPS: 100.0000 mg | ORAL_CAPSULE | Freq: Every day | ORAL | 1 refills | Status: DC

## 2020-08-10 ENCOUNTER — Other Ambulatory Visit: Payer: Self-pay | Admitting: Internal Medicine

## 2020-08-10 DIAGNOSIS — E039 Hypothyroidism, unspecified: Secondary | ICD-10-CM

## 2020-08-22 ENCOUNTER — Other Ambulatory Visit: Payer: Self-pay | Admitting: Internal Medicine

## 2020-08-22 DIAGNOSIS — I10 Essential (primary) hypertension: Secondary | ICD-10-CM

## 2020-08-24 ENCOUNTER — Other Ambulatory Visit (INDEPENDENT_AMBULATORY_CARE_PROVIDER_SITE_OTHER): Payer: BC Managed Care – PPO

## 2020-08-24 ENCOUNTER — Other Ambulatory Visit: Payer: Self-pay

## 2020-08-24 DIAGNOSIS — E039 Hypothyroidism, unspecified: Secondary | ICD-10-CM

## 2020-08-24 LAB — TSH: TSH: 5.13 u[IU]/mL — ABNORMAL HIGH (ref 0.35–4.50)

## 2020-09-01 ENCOUNTER — Encounter: Payer: Self-pay | Admitting: Internal Medicine

## 2020-09-10 ENCOUNTER — Other Ambulatory Visit: Payer: Self-pay | Admitting: Internal Medicine

## 2020-09-10 DIAGNOSIS — E039 Hypothyroidism, unspecified: Secondary | ICD-10-CM

## 2020-09-21 ENCOUNTER — Other Ambulatory Visit: Payer: Self-pay | Admitting: Internal Medicine

## 2020-09-30 ENCOUNTER — Other Ambulatory Visit: Payer: Self-pay | Admitting: Internal Medicine

## 2020-10-06 ENCOUNTER — Other Ambulatory Visit: Payer: Self-pay

## 2020-10-06 ENCOUNTER — Ambulatory Visit (INDEPENDENT_AMBULATORY_CARE_PROVIDER_SITE_OTHER): Payer: BC Managed Care – PPO | Admitting: Dermatology

## 2020-10-06 ENCOUNTER — Encounter: Payer: Self-pay | Admitting: Dermatology

## 2020-10-06 DIAGNOSIS — L659 Nonscarring hair loss, unspecified: Secondary | ICD-10-CM | POA: Diagnosis not present

## 2020-10-06 NOTE — Progress Notes (Signed)
   New Patient Visit  Subjective  Mary Schaefer is a 60 y.o. female who presents for the following: Alopecia (New patient here today to talk about hair loss. Patient advises she has noticed thinning at part and increased hair loss during the last 3 months. Patient with no recent illnesses or surgeries but she thinks she had Covid in January but did not test, had all of the symptoms and was around 4 people who were confirmed. Patient also advises her thyroid is borderline high and has lab results in chart. She has started 1000mg  Biotin but has not noticed any change.).  The following portions of the chart were reviewed this encounter and updated as appropriate:   Tobacco  Allergies  Meds  Problems  Med Hx  Surg Hx  Fam Hx      Review of Systems:  No other skin or systemic complaints except as noted in HPI or Assessment and Plan.  Objective  Well appearing patient in no apparent distress; mood and affect are within normal limits.  A focused examination was performed including scalp. Relevant physical exam findings are noted in the Assessment and Plan.  Objective  Scalp: Diffuse thinning of hair, positive hair pull test.    Assessment & Plan  Alopecia Scalp C/w Telogen Effluvium  Labs reviewed including vitamin D, TSH, ferritin Taking vitamin D daily but pt does not know dose. Pt has known elevated TSH, monitoring currently.   Patient has follow up for thyroid next month. Recommend patient send MyChart message with daily dosage of Vitamin D - was low when labs were done.  Recommend stopping biotin due to lack of good evidence for benefit and also interference with troponin and TSH assays  Recommend minoxidil 5% (Rogaine for men) solution or foam to be applied to the scalp and left in. This should ideally be used twice daily for best results but it helps with hair regrowth when used at least three times per week. Rogaine initially can cause increased hair shedding for the  first few weeks but this will stop with continued use. In studies, people who used minoxidil (Rogaine) for at least 6 months had thicker hair than people who did not. Minoxidil topical (Rogaine) only works as long as it continues to be used. If if it is no longer used then the hair it has been helping to regrow can fall out. Minoxidil topical (Rogaine) can cause increased facial hair growth which can usually be managed easily with a battery-operated hair trimmer. If facial hair growth is bothersome, switching to the 2% women's version can decrease the risk of unwanted facial hair growth.   Return in about 3 months (around 01/05/2021) for Alopecia.  Graciella Belton, RMA, am acting as scribe for Forest Gleason, MD .  Documentation: I have reviewed the above documentation for accuracy and completeness, and I agree with the above.  Forest Gleason, MD

## 2020-10-06 NOTE — Patient Instructions (Addendum)
If you have any questions or concerns for your doctor, please call our main line at [phone] and press option 4 to reach your doctor's medical assistant. If no one answers, please leave a voicemail as directed and we will return your call as soon as possible. Messages left after 4 pm will be answered the following business day.   You may also send [place] a message via [place]. We typically respond to MyChart messages within 1-2 business days.  For prescription refills, please ask your pharmacy to contact our office. Our fax number is [phone].  If you have an urgent issue when the clinic is closed that cannot wait until the next business day, you can page your doctor at the number below.    Please note that while we do our best to be available for urgent issues outside of office hours, we are not available 24/7.   If you have an urgent issue and are unable to reach [place], you may choose to seek medical care at your doctor's office, retail clinic, urgent care center, or emergency room.  If you have a medical emergency, please immediately call 911 or go to the emergency department.  Pager Numbers  - Dr. Nehemiah Massed: [phone]  - Dr. Laurence Ferrari: [phone]  - Dr. Nicole Kindred: [phone]  In the event of inclement weather, please call our main line at [phone] for an update on the status of any delays or closures.  Dermatology Medication Tips: Please keep the boxes that topical medications come in in order to help keep track of the instructions about where and how to use these. Pharmacies typically print the medication instructions only on the boxes and not directly on the medication tubes.   If your medication is too expensive, please contact our office at [phone] option 4 or send [place] a message through [place].   We are unable to tell what your co-pay for medications will be in advance as this is different depending on your insurance coverage. However, we may be able to find a substitute  medication at lower cost or fill out paperwork to get insurance to cover a needed medication.   If a prior authorization is required to get your medication covered by your insurance company, please allow [place] 1-2 business days to complete this process.  Drug prices often vary depending on where the prescription is filled and some pharmacies may offer cheaper prices.  The website www.goodrx.com contains coupons for medications through different pharmacies. The prices here do not account for what the cost may be with help from insurance (it may be cheaper with your insurance), but the website can give you the price if you did not use any insurance.  - You can print the associated coupon and take it with your prescription to the pharmacy.  - You may also stop by our office during regular business hours and pick up a GoodRx coupon card.  - If you need your prescription sent electronically to a different pharmacy, notify our office through [facility] or by phone at [phone] option 4.   Recommend minoxidil 5% (Rogaine for men) solution or foam to be applied to the scalp and left in. This should ideally be used twice daily for best results but it helps with hair regrowth when used at least three times per week. Rogaine initially can cause increased hair shedding for the first few weeks but this will stop with continued use. In studies, people who used minoxidil (Rogaine) for at least 6 months had  thicker hair than people who did not. Minoxidil topical (Rogaine) only works as long as it continues to be used. If if it is no longer used then the hair it has been helping to regrow can fall out. Minoxidil topical (Rogaine) can cause increased facial hair growth which can usually be managed easily with a battery-operated hair trimmer. If facial hair growth is bothersome, switching to the 2% women's version can decrease the risk of unwanted facial hair growth.

## 2020-10-07 ENCOUNTER — Other Ambulatory Visit: Payer: Self-pay

## 2020-10-07 DIAGNOSIS — L659 Nonscarring hair loss, unspecified: Secondary | ICD-10-CM

## 2020-10-12 ENCOUNTER — Other Ambulatory Visit: Payer: Self-pay | Admitting: Internal Medicine

## 2020-10-12 ENCOUNTER — Encounter: Payer: Self-pay | Admitting: Dermatology

## 2020-10-12 ENCOUNTER — Encounter: Payer: Self-pay | Admitting: Internal Medicine

## 2020-10-12 DIAGNOSIS — E039 Hypothyroidism, unspecified: Secondary | ICD-10-CM

## 2020-10-18 ENCOUNTER — Other Ambulatory Visit: Payer: Self-pay | Admitting: Internal Medicine

## 2020-10-18 ENCOUNTER — Other Ambulatory Visit: Payer: BC Managed Care – PPO

## 2020-10-18 ENCOUNTER — Other Ambulatory Visit: Payer: Self-pay

## 2020-10-18 DIAGNOSIS — E039 Hypothyroidism, unspecified: Secondary | ICD-10-CM

## 2020-10-18 DIAGNOSIS — E559 Vitamin D deficiency, unspecified: Secondary | ICD-10-CM | POA: Diagnosis not present

## 2020-10-19 ENCOUNTER — Ambulatory Visit
Admission: RE | Admit: 2020-10-19 | Discharge: 2020-10-19 | Disposition: A | Discharge status: Home / Self Care | Payer: BC Managed Care – PPO | Source: Ambulatory Visit | Attending: Internal Medicine | Admitting: Internal Medicine

## 2020-10-19 ENCOUNTER — Ambulatory Visit (INDEPENDENT_AMBULATORY_CARE_PROVIDER_SITE_OTHER): Payer: BC Managed Care – PPO | Admitting: Internal Medicine

## 2020-10-19 ENCOUNTER — Ambulatory Visit
Admission: RE | Admit: 2020-10-19 | Discharge: 2020-10-19 | Disposition: A | Discharge status: Home / Self Care | Payer: BC Managed Care – PPO | Attending: Internal Medicine | Admitting: Internal Medicine

## 2020-10-19 ENCOUNTER — Encounter: Payer: Self-pay | Admitting: Internal Medicine

## 2020-10-19 ENCOUNTER — Other Ambulatory Visit: Payer: Self-pay

## 2020-10-19 VITALS — BP 120/75 | HR 80 | Temp 97.3°F | Resp 16 | Ht 66.0 in | Wt 185.0 lb

## 2020-10-19 DIAGNOSIS — M25552 Pain in left hip: Secondary | ICD-10-CM | POA: Diagnosis not present

## 2020-10-19 DIAGNOSIS — M545 Low back pain, unspecified: Secondary | ICD-10-CM | POA: Diagnosis not present

## 2020-10-19 LAB — VITAMIN D 25 HYDROXY (VIT D DEFICIENCY, FRACTURES): Vit D, 25-Hydroxy: 33 ng/mL (ref 30–100)

## 2020-10-19 LAB — TSH: TSH: 2.87 mIU/L (ref 0.40–4.50)

## 2020-10-19 NOTE — Progress Notes (Signed)
Subjective:    Patient ID: Mary Schaefer, female    DOB: 10-01-1960, 60 y.o.   MRN: [id number]  HPI  Patient presents the clinic today with complaint of left low back pain. She reports this started about 1 month ago. It was intermittent but has now become more consistent. She describes the pain as achy and throbbing. The pain is worse with ambulation and walking up stares. The pain radiates into her left hip. She denies numbness, tingling or weakness of the left lower extremity. She denies any injury to the area. She denies loss of bowel or bladder control. She has taken Ibuprofen OTC with some relief of symptoms.  Review of Systems  Past Medical History:  Diagnosis Date  . Blood in stool   . Childhood asthma   . Diabetes mellitus without complication ([place])   . Endometriosis 1997  . Hypertension   . Thyroid disease     Current Outpatient Medications  Medication Sig Dispense Refill  . acetaminophen (TYLENOL) 500 MG tablet Take 1,000 mg by mouth as needed for mild pain.    . Ascorbic Acid (VITAMIN C PO) Take 1 tablet by mouth daily.    . busPIRone (BUSPAR) 7.5 MG tablet TAKE 1 TABLET BY MOUTH TWICE A DAY 180 tablet 0  . Calcium Carbonate-Vitamin D3 600-400 MG-UNIT TABS Take by mouth.    . cyanocobalamin 2000 MCG tablet Take 2,000 mcg by mouth 2 (two) times daily.    . Ferrous Sulfate (IRON) 325 (65 Fe) MG TABS Take 1 tablet (325 mg total) by mouth daily. 30 tablet 0  . gabapentin (NEURONTIN) 100 MG capsule TAKE 1 CAPSULE BY MOUTH AT BEDTIME. 30 capsule 1  . glucose blood test strip Check sugar BID as needed. DX E11.9 100 each 6  . hydrOXYzine (ATARAX/VISTARIL) 25 MG tablet TAKE 1 TABLET BY MOUTH THREE TIMES A DAY AS NEEDED 30 tablet 2  . levothyroxine (SYNTHROID) 112 MCG tablet *NEED APPOINTMENT FOR LAB* TAKE 1 TABLET BY MOUTH EVERY DAY 30 tablet 0  . lisinopril-hydrochlorothiazide (ZESTORETIC) 10-12.5 MG tablet Take 1 tablet by mouth daily. 90 tablet 2  . metFORMIN (GLUCOPHAGE-XR)  500 MG 24 hr tablet Take 1 tablet (500 mg total) by mouth 2 (two) times daily. 180 tablet 2  . naproxen sodium (ALEVE) 220 MG tablet Take 440 mg by mouth as needed (pain).    [name] ofloxacin (OCUFLOX) 0.3 % ophthalmic solution Apply 1 drop to eye daily.    . Omega-3 Fatty Acids (FISH OIL) 1200 MG CAPS Take 1 capsule by mouth 2 (two) times daily.     . prednisoLONE acetate (PRED FORTE) 1 % ophthalmic suspension INSTILL 1 DROP INTO LEFT EYE 4 TIMES A DAY    . rosuvastatin (CRESTOR) 10 MG tablet Take 1 tablet (10 mg total) by mouth daily. 90 tablet 0  . simvastatin (ZOCOR) 40 MG tablet Take 40 mg by mouth daily.     No current facility-administered medications for this visit.    Allergies  Allergen Reactions  . Aspirin Shortness Of Breath and Hives  . Penicillins Hives    Family History  Problem Relation Age of Onset  . Ovarian cancer Mother   . Hyperlipidemia Mother   . Alcohol abuse Father   . Hyperlipidemia Father   . Heart disease Father   . Stroke Father   . Hypertension Father   . Diabetes Sister   . Colon cancer Neg Hx   . Esophageal cancer Neg Hx  Social History   Socioeconomic History  . Marital status: Married    Spouse name: Not on file  . Number of children: Not on file  . Years of education: Not on file  . Highest education level: Not on file  Occupational History  . Not on file  Tobacco Use  . Smoking status: Never Smoker  . Smokeless tobacco: Never Used  Vaping Use  . Vaping Use: Never used  Substance and Sexual Activity  . Alcohol use: Yes    Alcohol/week: 1.0 - 2.0 standard drink    Types: 1 - 2 Glasses of wine per week    Comment: rare  . Drug use: Not Currently  . Sexual activity: Yes  Other Topics Concern  . Not on file  Social History Narrative  . Not on file   Social Determinants of Health   Financial Resource Strain: Not on file  Food Insecurity: Not on file  Transportation Needs: Not on file  Physical Activity: Not on file  Stress:  Not on file  Social Connections: Not on file  Intimate Partner Violence: Not on file     Constitutional: Denies fever, malaise, fatigue, headache or abrupt weight changes.  Respiratory: Denies difficulty breathing, shortness of breath, cough or sputum production.   Cardiovascular: Denies chest pain, chest tightness, palpitations or swelling in the hands or feet.  Gastrointestinal: Denies abdominal pain, bloating, constipation, diarrhea or blood in the stool.  GU: Denies urgency, frequency, pain with urination, burning sensation, blood in urine, odor or discharge. Musculoskeletal: Patient reports left low back pain.  Denies decrease in range of motion, difficulty with gait, or joint swelling.  Skin: Denies redness, rashes, lesions or ulcercations.  Neurological: Denies numbness, tingling, weakness or problems with balance and coordination.    No other specific complaints in a complete review of systems (except as listed in HPI above).     Objective:   Physical Exam  BP 120/75 (BP Location: Left Arm, Patient Position: Sitting, Cuff Size: Normal)   Pulse 80   Temp (!) 97.3 F (36.3 C) (Temporal)   Resp 16   Ht 5\' 6"  (1.676 m)   Wt 185 lb (83.9 kg)   LMP 09/10/2016   SpO2 100%   BMI 29.86 kg/m   Wt Readings from Last 3 Encounters:  07/15/20 183 lb (83 kg)  04/26/20 182 lb (82.6 kg)  06/24/19 162 lb (73.5 kg)    General: Appears her stated age, well developed, well nourished in NAD. Skin: Dry and intact. HEENT: Head: normal shape and size; Eyes: sclera white and EOMs intact;  Cardiovascular: Normal rate. Pedal pulse 2+ on the left. Pulmonary/Chest: Normal effort. Musculoskeletal: Normal flexion, extension, rotation and lateral bending of the spine. No pain with palpation over the lumbar spine or left SI joint. Normal abduction, adduction, flexion, extension, internal and external rotation of the left hip. Pain with palpation over the left trochanter and anterior iliac crest.  Strength 5/5 BLE. No difficulty with gait.  Neurological: Alert and oriented.  Psych: Mood and affect normal.    BMET    Component Value Date/Time   NA 135 07/15/2020 1614   K 4.1 07/15/2020 1614   CL 98 07/15/2020 1614   CO2 25 07/15/2020 1614   GLUCOSE 207 (H) 07/15/2020 1614   BUN 17 07/15/2020 1614   CREATININE 0.89 07/15/2020 1614   CREATININE 0.83 05/24/2018 1609   CALCIUM 10.0 07/15/2020 1614   GFRNONAA >60 09/17/2017 0732   GFRAA >60 09/17/2017 0732  Lipid Panel     Component Value Date/Time   CHOL 326 (H) 07/15/2020 1614   TRIG (H) 07/15/2020 1614    421.0 Triglyceride is over 400; calculations on Lipids are invalid.   HDL 47.20 07/15/2020 1614   CHOLHDL 7 07/15/2020 1614   VLDL 43.2 (H) 09/02/2019 0809   LDLCALC 122 (H) 05/15/2019 0905   LDLCALC 219 (H) 05/24/2018 1609    CBC    Component Value Date/Time   WBC 8.0 07/15/2020 1614   RBC 4.86 07/15/2020 1614   HGB 14.8 07/15/2020 1614   HCT 44.0 07/15/2020 1614   PLT 297.0 07/15/2020 1614   MCV 90.6 07/15/2020 1614   MCH 29.7 09/17/2017 0732   MCHC 33.7 07/15/2020 1614   RDW 12.8 07/15/2020 1614    Hgb A1C Lab Results  Component Value Date   HGBA1C 5.6 07/15/2020           Assessment & Plan:   Left Low Back Pain, Left Hip Pain:  I think the hip pain is causing the back pain Xray left hip today Continue Ibuprofen 600 mg Q6H prn with food Stretching exercises given Encouraged ice vs heat  Will follow up after xray, return precautions discussed  Webb Silversmith, NP This visit occurred during the SARS-CoV-2 public health emergency.  Safety protocols were in place, including screening questions prior to the visit, additional usage of staff PPE, and extensive cleaning of exam room while observing appropriate contact time as indicated for disinfecting solutions.

## 2020-10-19 NOTE — Patient Instructions (Signed)
Snapping Hip Syndrome Rehab Ask your health care provider which exercises are safe for you. Do exercises exactly as told by your health care provider and adjust them as directed. It is normal to feel mild stretching, pulling, tightness, or discomfort as you do these exercises. Stop right away if you feel sudden pain or your pain gets worse. Do not begin these exercises until told by your health care provider. Stretching and range-of-motion exercises These exercises warm up your muscles and joints and improve the movement and flexibility of your hip and pelvis. These exercises also help to relieve pain and stiffness. Hip rotators Hip rotators are muscles that keep the hip joint together. 1. Lie on your back on a firm surface. 2. With your left / right hand, gently pull your left / right knee toward the shoulder that is on the same side of the body. Stop when your knee is pointing toward the ceiling. 3. Hold your left / right ankle with your other hand. 4. Keeping your knee steady, gently pull your left / right ankle toward your other shoulder until you feel a stretch in your buttocks. ? Keep your hips and shoulders firmly planted while you do this stretch. 5. Hold this position for __________ seconds. 6. Slowly return to the starting position. Repeat __________ times. Complete this exercise __________ times a day.   Iliotibial band stretch An iliotibial band is a strong band of tissue that runs from the outer side of your hip to the outer side of your thigh and knee. 1. Lie on your side with your left / right leg in the top position. 2. Bend your left / right knee and grab your ankle. Stretch out your bottom arm to help you balance. 3. Slowly bring your knee back so your thigh is behind your body. 4. Slowly lower your knee toward the floor until you feel a gentle stretch on the outside of your left / right thigh. If you do not feel a stretch and your knee will not fall farther, place the heel of  your other foot on top of your knee and pull your knee down toward the floor with your foot. 5. Hold this position for __________ seconds. 6. Slowly return to the starting position. Repeat __________ times. Complete this exercise __________ times a day.   Lunge This exercise is also called a hip flexor stretch. 1. Place your left / right knee on the floor and bend your other knee so that it is directly over your ankle. You should be half-kneeling. 2. Keep good posture with your head over your shoulders. 3. Tighten your buttocks to point your tailbone downward. This will prevent your back from arching too much. 4. You should feel a gentle stretch in the front of your left / right thigh and hip (hip flexors). If you do not feel a stretch, slide your other foot forward slightly and then slowly lunge forward with your chest up until your knee once again lines up over your ankle. ? Make sure your tailbone continues to point downward. 5. Hold this position for __________ seconds. 6. Slowly return to the starting position. Repeat __________ times. Complete this exercise __________ times a day.   Strengthening exercises These exercises build strength and endurance in your hip and pelvis. Endurance is the ability to use your muscles for a long time, even after they get tired. Straight leg raises, side-lying This exercise is sometimes called a hip abductor exercise. It strengthens the muscles that rotate the  leg at the hip and move it away from your body (hip abductors). 1. Lie on your side with your left / right leg in the top position. Lie so your head, shoulder, hip, and knee line up. Bend your bottom knee slightly to help you balance. 2. Lift your top leg 4-6 inches (10-15 cm) while keeping your toes pointed straight ahead. 3. Hold this position for __________ seconds. 4. Slowly lower your leg to the starting position. 5. Let your muscles relax completely after each repetition. Repeat __________  times. Complete this exercise __________ times a day.   Hip abductors and rotators, quadruped This exercise strengthens the muscles that:  Keep the hip together (hip rotators).  Move the leg and hip away from your body (hip abductors). 1. Get on your hands and knees on a firm, lightly padded surface. This is the quadruped position. Your hands should be directly below your shoulders, and your knees should be directly below your hips. 2. Lift your left / right knee out to the side. Keep your knee bent. Do not twist your body. 3. Hold this position for __________ seconds. 4. Slowly lower your leg back to the starting position. Repeat __________ times. Complete this exercise __________ times a day.   This information is not intended to replace advice given to you by your health care provider. Make sure you discuss any questions you have with your health care provider. Document Revised: 09/24/2018 Document Reviewed: 07/03/2018 Elsevier Patient Education  2021 [employer].

## 2020-10-21 ENCOUNTER — Other Ambulatory Visit: Payer: Self-pay | Admitting: Internal Medicine

## 2020-10-21 DIAGNOSIS — E039 Hypothyroidism, unspecified: Secondary | ICD-10-CM

## 2020-10-23 ENCOUNTER — Encounter: Payer: Self-pay | Admitting: Internal Medicine

## 2020-10-24 ENCOUNTER — Other Ambulatory Visit: Payer: Self-pay | Admitting: Internal Medicine

## 2020-11-09 ENCOUNTER — Encounter: Payer: Self-pay | Admitting: Radiology

## 2020-11-10 ENCOUNTER — Encounter: Payer: BC Managed Care – PPO | Admitting: Family Medicine

## 2020-11-19 ENCOUNTER — Other Ambulatory Visit: Payer: Self-pay | Admitting: Internal Medicine

## 2020-11-19 DIAGNOSIS — E039 Hypothyroidism, unspecified: Secondary | ICD-10-CM

## 2020-11-19 NOTE — Telephone Encounter (Signed)
Requested medications are due for refill today yes  Requested medications are on the active medication list yes  Last refill 10/21/20  Last visit 5/10 but did not have lab drawn asordered  Future visit scheduled no  Notes to clinic Has already had a curtesy refill and there is no upcoming appointment scheduled, patient canceled two appts.

## 2020-11-24 ENCOUNTER — Encounter: Payer: BC Managed Care – PPO | Admitting: Family Medicine

## 2021-01-03 ENCOUNTER — Other Ambulatory Visit: Payer: Self-pay

## 2021-01-03 ENCOUNTER — Encounter: Payer: Self-pay | Admitting: Family Medicine

## 2021-01-03 ENCOUNTER — Ambulatory Visit (INDEPENDENT_AMBULATORY_CARE_PROVIDER_SITE_OTHER): Payer: BC Managed Care – PPO | Admitting: Family Medicine

## 2021-01-03 VITALS — BP 137/88 | HR 99 | Ht 66.0 in | Wt 181.6 lb

## 2021-01-03 DIAGNOSIS — Z01419 Encounter for gynecological examination (general) (routine) without abnormal findings: Secondary | ICD-10-CM | POA: Diagnosis not present

## 2021-01-03 DIAGNOSIS — Z8041 Family history of malignant neoplasm of ovary: Secondary | ICD-10-CM | POA: Diagnosis not present

## 2021-01-03 NOTE — Progress Notes (Signed)
   GYNECOLOGY ANNUAL PREVENTATIVE CARE ENCOUNTER NOTE  Subjective:   Mary Schaefer is a 60 y.o. [phone] female here for a routine annual gynecologic exam.  Current complaints: wants to establish care to assure follow up for fam hx of ovarian cancer. Mother dx at 47, died at 25. Had yearly [place], last in Sept 2021 and no issues. Reports yearly CA125 also negative. Never had genetic screening for BRCA.   Denies abnormal vaginal bleeding, discharge, pelvic pain, problems with intercourse or other gynecologic concerns.    Gynecologic History Patient's last menstrual period was 09/10/2016. Contraception: post menopausal status Last Pap: 1/27/201. Results were: normal Last mammogram: 2021. Results were: normal  The following portions of the patient's history were reviewed and updated as appropriate: allergies, current medications, past family history, past medical history, past social history, past surgical history and problem list.  Review of Systems Pertinent items are noted in HPI.   Objective:  BP 137/88   Pulse 99   Ht 5' 6" (1.676 m)   Wt 181 lb 9.6 oz (82.4 kg)   LMP 09/10/2016   BMI 29.31 kg/m  CONSTITUTIONAL: Well-developed, well-nourished female in no acute distress.  HENT:  Normocephalic, atraumatic, External right and left ear normal. Oropharynx is clear and moist EYES:  No scleral icterus.  NECK: Normal range of motion, supple, no masses.  Normal thyroid.  SKIN: Skin is warm and dry. No rash noted. Not diaphoretic. No erythema. No pallor. NEUROLOGIC: Alert and oriented to person, place, and time. Normal reflexes, muscle tone coordination. No cranial nerve deficit noted. PSYCHIATRIC: Normal mood and affect. Normal behavior. Normal judgment and thought content. CARDIOVASCULAR: Normal heart rate noted, regular rhythm. 2+ distal pulses. RESPIRATORY: Effort and breath sounds normal, no problems with respiration noted. BREASTS: Symmetric in size. No masses, skin changes, nipple  drainage, or lymphadenopathy. ABDOMEN: Soft,  no distention noted.  No tenderness, rebound or guarding.  PELVIC: Normal appearing external genitalia; normal appearing vaginal mucosa and cervix.  No abnormal discharge noted.  Pap not indicated- reviewed results from 2021 in media.  Normal uterine size, good mobility, no other palpable masses, no uterine or adnexal tenderness. MUSCULOSKELETAL: Normal range of motion.      Assessment and Plan:  1) Annual gynecologic examination - pap due in 2024   Routine preventative health maintenance measures emphasized.  2) Menopausal sx - vaginal dryness, decreased libido  1. Well woman exam with routine gynecological exam - MM 3D SCREEN BREAST BILATERAL; Future  2. Family history of ovarian cancer - Genetic Screening - CA 125 - [facility]   Please refer to After Visit Summary for other counseling recommendations.   Return in about 1 year (around 01/03/2022) for Yearly wellness exam.  Caren Macadam, MD, MPH, ABFM Attending [facility]

## 2021-01-04 ENCOUNTER — Encounter: Payer: Self-pay | Admitting: Family Medicine

## 2021-01-04 LAB — CEA: CEA: 1.2 ng/mL (ref 0.0–4.7)

## 2021-01-04 LAB — CA 125: Cancer Antigen (CA) 125: 17.7 U/mL (ref 0.0–38.1)

## 2021-01-06 ENCOUNTER — Ambulatory Visit: Payer: BC Managed Care – PPO | Admitting: Dermatology

## 2021-01-17 ENCOUNTER — Encounter: Payer: Self-pay | Admitting: Radiology

## 2021-01-17 ENCOUNTER — Telehealth: Payer: Self-pay | Admitting: Radiology

## 2021-01-19 NOTE — Telephone Encounter (Signed)
error 

## 2021-01-20 ENCOUNTER — Other Ambulatory Visit: Payer: Self-pay | Admitting: Internal Medicine

## 2021-01-20 NOTE — Telephone Encounter (Signed)
Requested medication (s) are due for refill today: yes   Requested medication (s) are on the active medication list: yes   Last refill:  10/25/2020  Future visit scheduled: no  Notes to clinic:  Failed protocol:  Total Cholesterol in normal range and within 360 days   Triglycerides in normal range and within 360 days      Requested Prescriptions  Pending Prescriptions Disp Refills   rosuvastatin (CRESTOR) 10 MG tablet [Pharmacy Med Name: ROSUVASTATIN CALCIUM 10 MG TAB] 90 tablet 0    Sig: TAKE 1 TABLET BY MOUTH EVERY DAY     Cardiovascular:  Antilipid - Statins Failed - 01/20/2021  8:11 AM      Failed - Total Cholesterol in normal range and within 360 days    Cholesterol  Date Value Ref Range Status  07/15/2020 326 (H) 0 - 200 mg/dL Final    Comment:    ATP III Classification       Desirable:  < 200 mg/dL               Borderline High:  200 - 239 mg/dL          High:  > = 240 mg/dL          Failed - Triglycerides in normal range and within 360 days    Triglycerides  Date Value Ref Range Status  07/15/2020 (H) 0.0 - 149.0 mg/dL Final   421.0 Triglyceride is over 400; calculations on Lipids are invalid.    Comment:    Normal:  <150 mg/dLBorderline High:  150 - 199 mg/dL          Passed - LDL in normal range and within 360 days    LDL Cholesterol (Calc)  Date Value Ref Range Status  05/24/2018 219 (H) mg/dL (calc) Final    Comment:    LDL-C levels > or = 190 mg/dL may indicate familial  hypercholesterolemia (FH). Clinical assessment and  measurement of blood lipid levels should be  considered for all first degree relatives of  patients with an FH diagnosis.  For questions about testing for familial hypercholesterolemia, please call Insurance risk surveyor at [email or web address]. Duncan Dull, et al. J National Lipid Association  Recommendations for Patient-Centered Management of  Dyslipidemia: Part 1 Journal of Clinical Lipidology  2015;9(2), 129-169. Reference  range: <100 . Desirable range <100 mg/dL for primary prevention;   <70 mg/dL for patients with CHD or diabetic patients  with > or = 2 CHD risk factors. [name] LDL-C is now calculated using the Martin-Hopkins  calculation, which is a validated novel method providing  better accuracy than the Friedewald equation in the  estimation of LDL-C.  Cresenciano Genre et al. Annamaria Helling. [id number]): 2061-2068  (http://education.[employer].com/faq/FAQ164)    LDL Cholesterol  Date Value Ref Range Status  05/15/2019 122 (H) 0 - 99 mg/dL Final   Direct LDL  Date Value Ref Range Status  07/15/2020 206.0 mg/dL Final    Comment:    Optimal:  <100 mg/dLNear or Above Optimal:  100-129 mg/dLBorderline High:  130-159 mg/dLHigh:  160-189 mg/dLVery High:  >190 mg/dL          Passed - HDL in normal range and within 360 days    HDL  Date Value Ref Range Status  07/15/2020 47.20 >39.00 mg/dL Final          Passed - Patient is not pregnant      Passed - Valid encounter within last 12 months    Recent Outpatient  Visits           3 months ago Left hip pain   [facility], Coralie Keens, NP       Future Appointments             In 1 month [facility], [place], [place]

## 2021-02-07 ENCOUNTER — Other Ambulatory Visit: Payer: Self-pay | Admitting: *Deleted

## 2021-02-07 MED ORDER — FLUCONAZOLE 150 MG PO TABS: 150.0000 mg | ORAL_TABLET | Freq: Once | ORAL | 3 refills | Status: AC

## 2021-02-10 ENCOUNTER — Other Ambulatory Visit: Payer: Self-pay

## 2021-02-10 ENCOUNTER — Ambulatory Visit (INDEPENDENT_AMBULATORY_CARE_PROVIDER_SITE_OTHER): Payer: BC Managed Care – PPO | Admitting: Internal Medicine

## 2021-02-10 ENCOUNTER — Encounter: Payer: Self-pay | Admitting: Internal Medicine

## 2021-02-10 VITALS — BP 110/72 | HR 91 | Temp 97.8°F | Resp 17 | Ht 66.0 in | Wt 179.8 lb

## 2021-02-10 DIAGNOSIS — Z6826 Body mass index (BMI) 26.0-26.9, adult: Secondary | ICD-10-CM | POA: Insufficient documentation

## 2021-02-10 DIAGNOSIS — Z6827 Body mass index (BMI) 27.0-27.9, adult: Secondary | ICD-10-CM | POA: Insufficient documentation

## 2021-02-10 DIAGNOSIS — E559 Vitamin D deficiency, unspecified: Secondary | ICD-10-CM | POA: Diagnosis not present

## 2021-02-10 DIAGNOSIS — L821 Other seborrheic keratosis: Secondary | ICD-10-CM

## 2021-02-10 DIAGNOSIS — R748 Abnormal levels of other serum enzymes: Secondary | ICD-10-CM | POA: Diagnosis not present

## 2021-02-10 DIAGNOSIS — L989 Disorder of the skin and subcutaneous tissue, unspecified: Secondary | ICD-10-CM

## 2021-02-10 DIAGNOSIS — E039 Hypothyroidism, unspecified: Secondary | ICD-10-CM

## 2021-02-10 DIAGNOSIS — K219 Gastro-esophageal reflux disease without esophagitis: Secondary | ICD-10-CM

## 2021-02-10 DIAGNOSIS — L659 Nonscarring hair loss, unspecified: Secondary | ICD-10-CM | POA: Diagnosis not present

## 2021-02-10 DIAGNOSIS — Z8616 Personal history of COVID-19: Secondary | ICD-10-CM

## 2021-02-10 DIAGNOSIS — E1165 Type 2 diabetes mellitus with hyperglycemia: Secondary | ICD-10-CM

## 2021-02-10 DIAGNOSIS — Z6829 Body mass index (BMI) 29.0-29.9, adult: Secondary | ICD-10-CM | POA: Insufficient documentation

## 2021-02-10 DIAGNOSIS — D518 Other vitamin B12 deficiency anemias: Secondary | ICD-10-CM | POA: Diagnosis not present

## 2021-02-10 DIAGNOSIS — E663 Overweight: Secondary | ICD-10-CM | POA: Insufficient documentation

## 2021-02-10 NOTE — Patient Instructions (Signed)
Carbohydrate Counting for Diabetes Mellitus, Adult Carbohydrate counting is a method of keeping track of how many carbohydrates you eat. Eating carbohydrates naturally increases the amount of sugar (glucose) in the blood. Counting how many carbohydrates you eat improves your blood glucose control, which helps you manage your diabetes. It is important to know how many carbohydrates you can safely have in each meal. This is different for every person. A dietitian can help you make a meal plan and calculate how many carbohydrates you should have at each meal and snack. What foods contain carbohydrates? Carbohydrates are found in the following foods: Grains, such as breads and cereals. Dried beans and soy products. Starchy vegetables, such as potatoes, peas, and corn. Fruit and fruit juices. Milk and yogurt. Sweets and snack foods, such as cake, cookies, candy, chips, and soft drinks. How do I count carbohydrates in foods? There are two ways to count carbohydrates in food. You can read food labels or learn standard serving sizes of foods. You can use either of the methods or a combination of both. Using the Nutrition Facts label The Nutrition Facts list is included on the labels of almost all packaged foods and beverages in the [place] It includes: The serving size. Information about nutrients in each serving, including the grams (g) of carbohydrate per serving. To use the Nutrition Facts: Decide how many servings you will have. Multiply the number of servings by the number of carbohydrates per serving. The resulting number is the total amount of carbohydrates that you will be having. Learning the standard serving sizes of foods When you eat carbohydrate foods that are not packaged or do not include Nutrition Facts on the label, you need to measure the servings in order to count the amount of carbohydrates. Measure the foods that you will eat with a food scale or measuring cup, if needed. Decide how  many standard-size servings you will eat. Multiply the number of servings by 15. For foods that contain carbohydrates, one serving equals 15 g of carbohydrates. For example, if you eat 2 cups or 10 oz (300 g) of strawberries, you will have eaten 2 servings and 30 g of carbohydrates (2 servings x 15 g = 30 g). For foods that have more than one food mixed, such as soups and casseroles, you must count the carbohydrates in each food that is included. The following list contains standard serving sizes of common carbohydrate-rich foods. Each of these servings has about 15 g of carbohydrates: 1 slice of bread. 1 six-inch (15 cm) tortilla. ? cup or 2 oz (53 g) cooked rice or pasta.  cup or 3 oz (85 g) cooked or canned, drained and rinsed beans or lentils.  cup or 3 oz (85 g) starchy vegetable, such as peas, corn, or squash.  cup or 4 oz (120 g) hot cereal.  cup or 3 oz (85 g) boiled or mashed potatoes, or  or 3 oz (85 g) of a large baked potato.  cup or 4 fl oz (118 mL) fruit juice. 1 cup or 8 fl oz (237 mL) milk. 1 small or 4 oz (106 g) apple.  or 2 oz (63 g) of a medium banana. 1 cup or 5 oz (150 g) strawberries. 3 cups or 1 oz (24 g) popped popcorn. What is an example of carbohydrate counting? To calculate the number of carbohydrates in this sample meal, follow the steps shown below. Sample meal 3 oz (85 g) chicken breast. ? cup or 4 oz (106 g) brown   rice.  cup or 3 oz (85 g) corn. 1 cup or 8 fl oz (237 mL) milk. 1 cup or 5 oz (150 g) strawberries with sugar-free whipped topping. Carbohydrate calculation Identify the foods that contain carbohydrates: Rice. Corn. Milk. Strawberries. Calculate how many servings you have of each food: 2 servings rice. 1 serving corn. 1 serving milk. 1 serving strawberries. Multiply each number of servings by 15 g: 2 servings rice x 15 g = 30 g. 1 serving corn x 15 g = 15 g. 1 serving milk x 15 g = 15 g. 1 serving strawberries x 15 g = 15  g. Add together all of the amounts to find the total grams of carbohydrates eaten: 30 g + 15 g + 15 g + 15 g = 75 g of carbohydrates total. What are tips for following this plan? Shopping Develop a meal plan and then make a shopping list. Buy fresh and frozen vegetables, fresh and frozen fruit, dairy, eggs, beans, lentils, and whole grains. Look at food labels. Choose foods that have more fiber and less sugar. Avoid processed foods and foods with added sugars. Meal planning Aim to have the same amount of carbohydrates at each meal and for each snack time. Plan to have regular, balanced meals and snacks. Where to find more information American Diabetes Association: www.diabetes.org Centers for Disease Control and Prevention: www.cdc.gov Summary Carbohydrate counting is a method of keeping track of how many carbohydrates you eat. Eating carbohydrates naturally increases the amount of sugar (glucose) in the blood. Counting how many carbohydrates you eat improves your blood glucose control, which helps you manage your diabetes. A dietitian can help you make a meal plan and calculate how many carbohydrates you should have at each meal and snack. This information is not intended to replace advice given to you by your health care provider. Make sure you discuss any questions you have with your health care provider. Document Revised: 05/29/2019 Document Reviewed: 05/30/2019 Elsevier Patient Education  2021 [employer]  

## 2021-02-10 NOTE — Progress Notes (Signed)
Subjective:    Patient ID: Mary Schaefer, female    DOB: 1961/03/28, 60 y.o.   MRN: [id number]  HPI  Pt presents to the clinic today for a skin check. She reports she has multiple moles that she would like checked. She does see dermatology and has an upcoming appt scheduled.  She also would like her liver enzymes repeated. Her last AST/ALT was 48/105, 07/2020. She does have evidence of fatty liver based on [place] from 02/2019.  She also reports more frequent GERD. She reports carbonation seems to make this worse. She reports her symptoms are controlled when she takes Prilosec OTC, but seems to return when she stops taking it. There is no upper GI on file.  She also reports hair thinning. She noticed this 7 months ago after getting Covid. She denies actual bald spots. She tried Rogaine per her dermatologist recommendation but she reports this seemed to make it worse. She does have hypothyroidism and is taking her Levothyroxine as prescribed.  She is also due to follow up DM 2: Her last A1C was 5.6%, 07/2020. She is not checking her sugars. She is not currently taking any oral diabetic medication at this time.  Review of Systems     Past Medical History:  Diagnosis Date   Blood in stool    Childhood asthma    Diabetes mellitus without complication ([place])    Endometriosis 1997   Hypertension    Thyroid disease     Current Outpatient Medications  Medication Sig Dispense Refill   acetaminophen (TYLENOL) 500 MG tablet Take 1,000 mg by mouth as needed for mild pain.     Ascorbic Acid (VITAMIN C PO) Take 1 tablet by mouth daily.     busPIRone (BUSPAR) 7.5 MG tablet TAKE 1 TABLET BY MOUTH TWICE A DAY 180 tablet 0   Calcium Carbonate-Vitamin D3 600-400 MG-UNIT TABS Take by mouth.     cyanocobalamin 2000 MCG tablet Take 2,000 mcg by mouth 2 (two) times daily.     gabapentin (NEURONTIN) 100 MG capsule TAKE 1 CAPSULE BY MOUTH EVERYDAY AT BEDTIME 90 capsule 0   glucose blood test strip Check sugar  BID as needed. DX E11.9 100 each 6   hydrOXYzine (ATARAX/VISTARIL) 25 MG tablet TAKE 1 TABLET BY MOUTH THREE TIMES A DAY AS NEEDED (Patient not taking: Reported on 01/03/2021) 30 tablet 2   levothyroxine (SYNTHROID) 112 MCG tablet TAKE 1 TABLET BY MOUTH EVERY DAY(NEED APPT FOR LAB) 90 tablet 0   lisinopril-hydrochlorothiazide (ZESTORETIC) 10-12.5 MG tablet Take 1 tablet by mouth daily. 90 tablet 2   metFORMIN (GLUCOPHAGE-XR) 500 MG 24 hr tablet Take 1 tablet (500 mg total) by mouth 2 (two) times daily. 180 tablet 2   naproxen sodium (ALEVE) 220 MG tablet Take 440 mg by mouth as needed (pain).     Omega-3 Fatty Acids (FISH OIL) 1200 MG CAPS Take 1 capsule by mouth 2 (two) times daily.      prednisoLONE acetate (PRED FORTE) 1 % ophthalmic suspension INSTILL 1 DROP INTO LEFT EYE 4 TIMES A DAY     rosuvastatin (CRESTOR) 10 MG tablet TAKE 1 TABLET BY MOUTH EVERY DAY 90 tablet 0   No current facility-administered medications for this visit.    Allergies  Allergen Reactions   Aspirin Shortness Of Breath and Hives   Penicillins Hives    Family History  Problem Relation Age of Onset   Ovarian cancer Mother    Hyperlipidemia Mother    Alcohol abuse  Father    Hyperlipidemia Father    Heart disease Father    Stroke Father    Hypertension Father    Diabetes Sister    Colon cancer Neg Hx    Esophageal cancer Neg Hx     Social History   Socioeconomic History   Marital status: Married    Spouse name: Not on file   Number of children: Not on file   Years of education: Not on file   Highest education level: Not on file  Occupational History   Not on file  Tobacco Use   Smoking status: Never   Smokeless tobacco: Never  Vaping Use   Vaping Use: Never used  Substance and Sexual Activity   Alcohol use: Yes    Alcohol/week: 1.0 - 2.0 standard drink    Types: 1 - 2 Glasses of wine per week    Comment: rare   Drug use: Not Currently   Sexual activity: Yes  Other Topics Concern   Not on  file  Social History Narrative   Not on file   Social Determinants of Health   Financial Resource Strain: Not on file  Food Insecurity: Not on file  Transportation Needs: Not on file  Physical Activity: Not on file  Stress: Not on file  Social Connections: Not on file  Intimate Partner Violence: Not on file     Constitutional: Denies fever, malaise, fatigue, headache or abrupt weight changes.  HEENT: Denies eye pain, eye redness, ear pain, ringing in the ears, wax buildup, runny nose, nasal congestion, bloody nose, or sore throat. Respiratory: Denies difficulty breathing, shortness of breath, cough or sputum production.   Cardiovascular: Denies chest pain, chest tightness, palpitations or swelling in the hands or feet.  Gastrointestinal: Pt reports reflux. Denies abdominal pain, bloating, constipation, diarrhea or blood in the stool.  GU: Denies urgency, frequency, pain with urination, burning sensation, blood in urine, odor or discharge. Musculoskeletal: Pt reports nocturnal muscle cramps. Denies decrease in range of motion, difficulty with gait, or joint pain and swelling.  Skin: Pt reports multiple skin lesions, hair thinning. Denies redness, rashes, or ulcercations.  Neurological: Denies dizziness, difficulty with memory, difficulty with speech or problems with balance and coordination.  Psych: Denies anxiety, depression, SI/HI.  No other specific complaints in a complete review of systems (except as listed in HPI above).  Objective:   Physical Exam   BP 110/72 (BP Location: Right Arm, Patient Position: Sitting, Cuff Size: Normal)   Pulse 91   Temp 97.8 F (36.6 C) (Temporal)   Resp 17   Ht '5\' 6"'$  (1.676 m)   Wt 179 lb 12.8 oz (81.6 kg)   LMP 09/10/2016   SpO2 98%   BMI 29.02 kg/m   Wt Readings from Last 3 Encounters:  01/03/21 181 lb 9.6 oz (82.4 kg)  10/19/20 185 lb (83.9 kg)  07/15/20 183 lb (83 kg)    General: Appears her stated age, overweight, in  NAD. Skin: Warm, dry and intact. Seborrheic keratosis noted of right temple. She has a scabbed papule adjacent to another small papule of her right medial thigh. No ulcerations noted. HEENT: Head: normal shape and size; Eyes: sclera white and EOMs intact;  Neck:  Neck supple, trachea midline. No masses, lumps present.  Cardiovascular: Normal rate and rhythm. S1,S2 noted.  No murmur, rubs or gallops noted. No JVD or BLE edema. No carotid bruits noted. Pulmonary/Chest: Normal effort and positive vesicular breath sounds. No respiratory distress. No wheezes, rales or  ronchi noted.  Abdomen: Soft and nontender. Normal bowel sounds. No distention or masses noted. Liver, spleen and kidneys non palpable. Musculoskeletal: No difficulty with gait.  Neurological: Alert and oriented.  BMET    Component Value Date/Time   NA 135 07/15/2020 1614   K 4.1 07/15/2020 1614   CL 98 07/15/2020 1614   CO2 25 07/15/2020 1614   GLUCOSE 207 (H) 07/15/2020 1614   BUN 17 07/15/2020 1614   CREATININE 0.89 07/15/2020 1614   CREATININE 0.83 05/24/2018 1609   CALCIUM 10.0 07/15/2020 1614   GFRNONAA >60 09/17/2017 0732   GFRAA >60 09/17/2017 0732    Lipid Panel     Component Value Date/Time   CHOL 326 (H) 07/15/2020 1614   TRIG (H) 07/15/2020 1614    421.0 Triglyceride is over 400; calculations on Lipids are invalid.   HDL 47.20 07/15/2020 1614   CHOLHDL 7 07/15/2020 1614   VLDL 43.2 (H) 09/02/2019 0809   LDLCALC 122 (H) 05/15/2019 0905   LDLCALC 219 (H) 05/24/2018 1609    CBC    Component Value Date/Time   WBC 8.0 07/15/2020 1614   RBC 4.86 07/15/2020 1614   HGB 14.8 07/15/2020 1614   HCT 44.0 07/15/2020 1614   PLT 297.0 07/15/2020 1614   MCV 90.6 07/15/2020 1614   MCH 29.7 09/17/2017 0732   MCHC 33.7 07/15/2020 1614   RDW 12.8 07/15/2020 1614    Hgb A1C Lab Results  Component Value Date   HGBA1C 5.6 07/15/2020           Assessment & Plan:   Seborrheic Keratosis:  Benign skin  lesion No action needed  Papule of Left Thigh:  She will follow up with dermatology  Hair Thinning, Hx of Covid:  TSH, Vit D and B12 today Consider short term Spironolactone pending kidney function/potassium results   RTC in 6 months for your annual exam Webb Silversmith, NP This visit occurred during the SARS-CoV-2 public health emergency.  Safety protocols were in place, including screening questions prior to the visit, additional usage of staff PPE, and extensive cleaning of exam room while observing appropriate contact time as indicated for disinfecting solutions.

## 2021-02-10 NOTE — Assessment & Plan Note (Signed)
Encouraged diet and exercise for weight loss ?

## 2021-02-10 NOTE — Assessment & Plan Note (Signed)
A1C today No urine microalbumin secondary to ACEI therapy Encouraged her to consume a low carb diet and exercise for weight loss Not medicated

## 2021-02-10 NOTE — Assessment & Plan Note (Signed)
TSH and Free T4 today Will adjust Levothyroxine if needed based on labs 

## 2021-02-10 NOTE — Assessment & Plan Note (Signed)
Try to avoid foods that trigger your reflux Encouraged weight loss as this can help reduce reflux symptoms Start taking Omeprazole daily

## 2021-02-11 ENCOUNTER — Encounter: Payer: Self-pay | Admitting: Internal Medicine

## 2021-02-11 LAB — COMPLETE METABOLIC PANEL WITH GFR
AG Ratio: 1.7 (calc) (ref 1.0–2.5)
ALT: 42 U/L — ABNORMAL HIGH (ref 6–29)
AST: 36 U/L — ABNORMAL HIGH (ref 10–35)
Albumin: 4.5 g/dL (ref 3.6–5.1)
Alkaline phosphatase (APISO): 69 U/L (ref 37–153)
BUN: 17 mg/dL (ref 7–25)
CO2: 26 mmol/L (ref 20–32)
Calcium: 9.7 mg/dL (ref 8.6–10.4)
Chloride: 100 mmol/L (ref 98–110)
Creat: 0.65 mg/dL (ref 0.50–1.03)
Globulin: 2.6 g/dL (calc) (ref 1.9–3.7)
Glucose, Bld: 222 mg/dL — ABNORMAL HIGH (ref 65–99)
Potassium: 3.9 mmol/L (ref 3.5–5.3)
Sodium: 137 mmol/L (ref 135–146)
Total Bilirubin: 0.6 mg/dL (ref 0.2–1.2)
Total Protein: 7.1 g/dL (ref 6.1–8.1)
eGFR: 101 mL/min/{1.73_m2} (ref >=60)

## 2021-02-11 LAB — VITAMIN D 25 HYDROXY (VIT D DEFICIENCY, FRACTURES): Vit D, 25-Hydroxy: 59 ng/mL (ref 30–100)

## 2021-02-11 LAB — TSH: TSH: 3.42 mIU/L (ref 0.40–4.50)

## 2021-02-11 LAB — HEMOGLOBIN A1C
Hgb A1c MFr Bld: 9.9 % of total Hgb — ABNORMAL HIGH (ref <5.7)
Mean Plasma Glucose: 237 mg/dL
eAG (mmol/L): 13.2 mmol/L

## 2021-02-11 LAB — VITAMIN B12: Vitamin B-12: 1542 pg/mL — ABNORMAL HIGH (ref 200–1100)

## 2021-02-11 LAB — T4, FREE: Free T4: 1.7 ng/dL (ref 0.8–1.8)

## 2021-02-15 MED ORDER — METFORMIN HCL 1000 MG PO TABS: 1000.0000 mg | ORAL_TABLET | Freq: Two times a day (BID) | ORAL | 2 refills | Status: DC

## 2021-02-16 ENCOUNTER — Other Ambulatory Visit: Payer: Self-pay | Admitting: Internal Medicine

## 2021-02-16 DIAGNOSIS — E039 Hypothyroidism, unspecified: Secondary | ICD-10-CM

## 2021-02-17 ENCOUNTER — Other Ambulatory Visit: Payer: Self-pay | Admitting: Internal Medicine

## 2021-02-18 MED ORDER — METFORMIN HCL ER 500 MG PO TB24: 1000.0000 mg | ORAL_TABLET | Freq: Two times a day (BID) | ORAL | 2 refills | Status: DC

## 2021-02-18 NOTE — Addendum Note (Signed)
Addended by: Jearld Fenton on: 02/18/2021 11:59 AM   Modules accepted: Orders

## 2021-03-01 ENCOUNTER — Ambulatory Visit: Payer: BC Managed Care – PPO | Admitting: Dermatology

## 2021-03-14 ENCOUNTER — Other Ambulatory Visit: Payer: Self-pay

## 2021-03-14 ENCOUNTER — Ambulatory Visit
Admission: RE | Admit: 2021-03-14 | Discharge: 2021-03-14 | Disposition: A | Discharge status: Home / Self Care | Payer: BC Managed Care – PPO | Source: Ambulatory Visit | Attending: Family Medicine | Admitting: Family Medicine

## 2021-03-14 DIAGNOSIS — Z1231 Encounter for screening mammogram for malignant neoplasm of breast: Secondary | ICD-10-CM | POA: Diagnosis not present

## 2021-03-14 DIAGNOSIS — Z01419 Encounter for gynecological examination (general) (routine) without abnormal findings: Secondary | ICD-10-CM

## 2021-03-23 ENCOUNTER — Encounter: Payer: Self-pay | Admitting: Internal Medicine

## 2021-03-23 ENCOUNTER — Encounter: Payer: Self-pay | Admitting: *Deleted

## 2021-03-24 ENCOUNTER — Encounter: Payer: Self-pay | Admitting: Family Medicine

## 2021-03-24 DIAGNOSIS — Z8489 Family history of other specified conditions: Secondary | ICD-10-CM | POA: Insufficient documentation

## 2021-03-24 MED ORDER — GLUCOSE BLOOD VI STRP: ORAL_STRIP | 12 refills | Status: DC

## 2021-04-04 ENCOUNTER — Encounter: Payer: Self-pay | Admitting: Emergency Medicine

## 2021-04-04 ENCOUNTER — Other Ambulatory Visit: Payer: Self-pay

## 2021-04-04 ENCOUNTER — Ambulatory Visit: Payer: Self-pay

## 2021-04-04 ENCOUNTER — Ambulatory Visit
Admission: EM | Admit: 2021-04-04 | Discharge: 2021-04-04 | Disposition: A | Discharge status: Home / Self Care | Payer: BC Managed Care – PPO | Attending: Emergency Medicine | Admitting: Emergency Medicine

## 2021-04-04 DIAGNOSIS — R1032 Left lower quadrant pain: Secondary | ICD-10-CM | POA: Diagnosis not present

## 2021-04-04 DIAGNOSIS — M545 Low back pain, unspecified: Secondary | ICD-10-CM

## 2021-04-04 LAB — POCT URINALYSIS DIP (MANUAL ENTRY)
Bilirubin, UA: NEGATIVE
Blood, UA: NEGATIVE
Glucose, UA: NEGATIVE mg/dL
Nitrite, UA: NEGATIVE
Protein Ur, POC: NEGATIVE mg/dL
Spec Grav, UA: 1.025 (ref 1.010–1.025)
Urobilinogen, UA: 0.2 E.U./dL
pH, UA: 5.5 (ref 5.0–8.0)

## 2021-04-04 NOTE — Discharge Instructions (Signed)
If you cannot be seen by your gynecologist, please seek care at local emergency department for further care and management.

## 2021-04-04 NOTE — ED Provider Notes (Signed)
[facility]  ____________________________________________  Time seen: Approximately 1:13 PM  I have reviewed the triage vital signs and the nursing notes.   HISTORY  Chief Complaint Back Pain   Historian Patient     HPI Mary Schaefer is a 60 y.o. female presents to the urgent care with left-sided low back pain that radiates to the groin.  Patient states that she has had symptoms for approximately 1 week.  No fever, chills or vomiting.  No prior history of diverticulitis.  She reports that she has been having regular daily bowel movements.  No falls or mechanisms of trauma.  No prior history of ovarian cysts.  She states that pain is dull and achy relieved with sitting and worsened with movement.  No chest pain, chest tightness or current abdominal pain.   Past Medical History:  Diagnosis Date   Blood in stool    Childhood asthma    Diabetes mellitus without complication ([place])    Endometriosis 1997   Hypertension    Thyroid disease      Immunizations up to date:  Yes.     Past Medical History:  Diagnosis Date   Blood in stool    Childhood asthma    Diabetes mellitus without complication ([place])    Endometriosis 1997   Hypertension    Thyroid disease     Patient Active Problem List   Diagnosis Date Noted   Family history of neoplasm of ovary 03/24/2021   Overweight with body mass index (BMI) of 29 to 29.9 in adult 02/10/2021   GERD (gastroesophageal reflux disease) 07/19/2020   Type 2 diabetes mellitus with hyperglycemia, without long-term current use of insulin ([place]) 02/12/2019   HLD (hyperlipidemia) 09/06/2017   Essential hypertension 03/31/2016   Acquired hypothyroidism 03/31/2016   Endometriosis 03/31/2016   Generalized anxiety disorder 03/31/2016    Past Surgical History:  Procedure Laterality Date   [place]    Prior to Admission medications   Medication Sig Start Date End Date Taking? Authorizing  Provider  acetaminophen (TYLENOL) 500 MG tablet Take 1,000 mg by mouth as needed for mild pain.   Yes [provider]  Ascorbic Acid (VITAMIN C PO) Take 1 tablet by mouth daily.   Yes [provider]  busPIRone (BUSPAR) 7.5 MG tablet TAKE 1 TABLET BY MOUTH TWICE A DAY 09/30/20  Yes Baity, Coralie Keens, NP  Calcium Carbonate-Vitamin D3 600-400 MG-UNIT TABS Take by mouth.   Yes [provider]  cyanocobalamin 2000 MCG tablet Take 2,000 mcg by mouth 2 (two) times daily.   Yes [provider]  gabapentin (NEURONTIN) 100 MG capsule TAKE 1 CAPSULE BY MOUTH EVERYDAY AT BEDTIME 02/17/21  Yes Baity, Coralie Keens, NP  levothyroxine (SYNTHROID) 112 MCG tablet TAKE 1 TABLET BY MOUTH EVERY DAY(NEED APPT FOR LAB) 02/16/21  Yes Baity, Coralie Keens, NP  lisinopril-hydrochlorothiazide (ZESTORETIC) 10-12.5 MG tablet Take 1 tablet by mouth daily. 08/24/20  Yes Jearld Fenton, NP  metFORMIN (GLUCOPHAGE XR) 500 MG 24 hr tablet Take 2 tablets (1,000 mg total) by mouth in the morning and at bedtime. 02/18/21  Yes Jearld Fenton, NP  omeprazole (PRILOSEC) 10 MG capsule Take 10 mg by mouth daily.   Yes [provider]  prednisoLONE acetate (PRED FORTE) 1 % ophthalmic suspension INSTILL 1 DROP INTO LEFT EYE 4 TIMES A DAY 10/02/18  Yes [provider]  rosuvastatin (CRESTOR) 10 MG tablet TAKE 1 TABLET BY MOUTH  EVERY DAY 01/20/21  Yes Baity, Coralie Keens, NP  glucose blood test strip Check sugar BID as needed. DX E11.9 02/21/19   Jearld Fenton, NP  glucose blood test strip Use as instructed 03/24/21   Jearld Fenton, NP    Allergies Aspirin and Penicillins  Family History  Problem Relation Age of Onset   Ovarian cancer Mother    Hyperlipidemia Mother    Alcohol abuse Father    Hyperlipidemia Father    Heart disease Father    Stroke Father    Hypertension Father    Diabetes Sister    Colon cancer Neg Hx    Esophageal cancer Neg Hx     Social History Social History   Tobacco  Use   Smoking status: Never   Smokeless tobacco: Never  Vaping Use   Vaping Use: Never used  Substance Use Topics   Alcohol use: Yes    Alcohol/week: 1.0 - 2.0 standard drink    Types: 1 - 2 Glasses of wine per week    Comment: rare   Drug use: Not Currently     Review of Systems  Constitutional: No fever/chills Eyes:  No discharge ENT: No upper respiratory complaints. Respiratory: no cough. No SOB/ use of accessory muscles to breath Gastrointestinal:   No nausea, no vomiting.  No diarrhea.  No constipation. Musculoskeletal: Patient has low back pain.  Skin: Negative for rash, abrasions, lacerations, ecchymosis.    ____________________________________________   PHYSICAL EXAM:  VITAL SIGNS: ED Triage Vitals  Enc Vitals Group     BP 04/04/21 1257 109/75     Pulse Rate 04/04/21 1257 88     Resp --      Temp 04/04/21 1257 98.6 F (37 C)     Temp Source 04/04/21 1257 Oral     SpO2 04/04/21 1257 99 %     Weight --      Height --      Head Circumference --      Peak Flow --      Pain Score 04/04/21 1253 4     Pain Loc --      Pain Edu? --      Excl. in [place]? --      Constitutional: Alert and oriented. Well appearing and in no acute distress. Eyes: Conjunctivae are normal. PERRL. EOMI. Head: Atraumatic. ENT: Cardiovascular: Normal rate, regular rhythm. Normal S1 and S2.  Good peripheral circulation. Respiratory: Normal respiratory effort without tachypnea or retractions. Lungs CTAB. Good air entry to the bases with no decreased or absent breath sounds Gastrointestinal: Bowel sounds x 4 quadrants. Soft and nontender to palpation. No guarding or rigidity. No distention. Musculoskeletal: Full range of motion to all extremities. No obvious deformities noted Neurologic:  Normal for age. No gross focal neurologic deficits are appreciated.  Skin:  Skin is warm, dry and intact. No rash noted. Psychiatric: Mood and affect are normal for age. Speech and behavior are normal.    ____________________________________________   LABS (all labs ordered are listed, but only abnormal results are displayed)  Labs Reviewed  POCT URINALYSIS DIP (MANUAL ENTRY) - Abnormal; Notable for the following components:      Result Value   Ketones, POC UA trace (5) (*)    Leukocytes, UA Small (1+) (*)    All other components within normal limits   ____________________________________________  EKG   ____________________________________________  RADIOLOGY   No results found.  ____________________________________________    PROCEDURES  Procedure(s) performed:  Procedures     Medications - No data to display   ____________________________________________   INITIAL IMPRESSION / ASSESSMENT AND PLAN / ED COURSE  Pertinent labs & imaging results that were available during my care of the patient were reviewed by me and considered in my medical decision making (see chart for details).      Assessment and plan: Low back pain Groin pain  60 year old female presents to the urgent care with left-sided low back pain that radiates to the groin for the past week.  Vital signs are reassuring at triage.  On physical exam, patient was alert, active and nontoxic-appearing with no left lower quadrant abdominal tenderness elicited with palpation.  Urinalysis showed no signs of UTI and no hematuria to suggest nephrolithiasis.  I recommended that patient seek care with her gynecologist for basic labs and imaging.  Caution patient that if she cannot follow-up with her OB/GYN to seek care at local emergency department.  She voiced understanding.     ____________________________________________  FINAL CLINICAL IMPRESSION(S) / ED DIAGNOSES  Final diagnoses:  Acute left-sided low back pain without sciatica  Left inguinal pain      NEW MEDICATIONS STARTED DURING THIS VISIT:  ED Discharge Orders     None           This chart was dictated using voice  recognition software/Dragon. Despite best efforts to proofread, errors can occur which can change the meaning. Any change was purely unintentional.     Lannie Fields, PA-C 04/04/21 1322

## 2021-04-04 NOTE — ED Triage Notes (Signed)
Pt c/o lower left side back pain that radiates to the groin are x 1 week. Pt describes the pain and menstrual cramps.

## 2021-04-05 ENCOUNTER — Ambulatory Visit (INDEPENDENT_AMBULATORY_CARE_PROVIDER_SITE_OTHER): Payer: BC Managed Care – PPO | Admitting: Dermatology

## 2021-04-05 ENCOUNTER — Encounter: Payer: Self-pay | Admitting: Intensive Care

## 2021-04-05 ENCOUNTER — Emergency Department
Admission: EM | Admit: 2021-04-05 | Discharge: 2021-04-05 | Disposition: A | Discharge status: Home / Self Care | Payer: BC Managed Care – PPO | Attending: Emergency Medicine | Admitting: Emergency Medicine

## 2021-04-05 ENCOUNTER — Encounter: Payer: Self-pay | Admitting: Internal Medicine

## 2021-04-05 ENCOUNTER — Encounter: Payer: Self-pay | Admitting: Dermatology

## 2021-04-05 ENCOUNTER — Emergency Department: Payer: BC Managed Care – PPO

## 2021-04-05 ENCOUNTER — Other Ambulatory Visit: Payer: Self-pay

## 2021-04-05 DIAGNOSIS — D1800 Hemangioma unspecified site: Secondary | ICD-10-CM

## 2021-04-05 DIAGNOSIS — Z5321 Procedure and treatment not carried out due to patient leaving prior to being seen by health care provider: Secondary | ICD-10-CM | POA: Insufficient documentation

## 2021-04-05 DIAGNOSIS — N200 Calculus of kidney: Secondary | ICD-10-CM | POA: Diagnosis not present

## 2021-04-05 DIAGNOSIS — L821 Other seborrheic keratosis: Secondary | ICD-10-CM | POA: Diagnosis not present

## 2021-04-05 DIAGNOSIS — R1032 Left lower quadrant pain: Secondary | ICD-10-CM | POA: Insufficient documentation

## 2021-04-05 DIAGNOSIS — I700 Atherosclerosis of aorta: Secondary | ICD-10-CM | POA: Diagnosis not present

## 2021-04-05 DIAGNOSIS — R110 Nausea: Secondary | ICD-10-CM | POA: Insufficient documentation

## 2021-04-05 DIAGNOSIS — D2371 Other benign neoplasm of skin of right lower limb, including hip: Secondary | ICD-10-CM

## 2021-04-05 DIAGNOSIS — D239 Other benign neoplasm of skin, unspecified: Secondary | ICD-10-CM

## 2021-04-05 DIAGNOSIS — L650 Telogen effluvium: Secondary | ICD-10-CM | POA: Diagnosis not present

## 2021-04-05 DIAGNOSIS — L578 Other skin changes due to chronic exposure to nonionizing radiation: Secondary | ICD-10-CM

## 2021-04-05 DIAGNOSIS — D229 Melanocytic nevi, unspecified: Secondary | ICD-10-CM

## 2021-04-05 LAB — CBC
HCT: 45.8 % (ref 36.0–46.0)
Hemoglobin: 15.8 g/dL — ABNORMAL HIGH (ref 12.0–15.0)
MCH: 31 pg (ref 26.0–34.0)
MCHC: 34.5 g/dL (ref 30.0–36.0)
MCV: 90 fL (ref 80.0–100.0)
Platelets: 284 10*3/uL (ref 150–400)
RBC: 5.09 MIL/uL (ref 3.87–5.11)
RDW: 12.3 % (ref 11.5–15.5)
WBC: 6.1 10*3/uL (ref 4.0–10.5)
nRBC: 0 % (ref 0.0–0.2)

## 2021-04-05 LAB — COMPREHENSIVE METABOLIC PANEL
ALT: 45 U/L — ABNORMAL HIGH (ref 0–44)
AST: 35 U/L (ref 15–41)
Albumin: 4.8 g/dL (ref 3.5–5.0)
Alkaline Phosphatase: 53 U/L (ref 38–126)
Anion gap: 10 (ref 5–15)
BUN: 14 mg/dL (ref 6–20)
CO2: 27 mmol/L (ref 22–32)
Calcium: 9.7 mg/dL (ref 8.9–10.3)
Chloride: 100 mmol/L (ref 98–111)
Creatinine, Ser: 0.85 mg/dL (ref 0.44–1.00)
GFR, Estimated: >60 mL/min (ref >60)
Glucose, Bld: 115 mg/dL — ABNORMAL HIGH (ref 70–99)
Potassium: 3.8 mmol/L (ref 3.5–5.1)
Sodium: 137 mmol/L (ref 135–145)
Total Bilirubin: 0.9 mg/dL (ref 0.3–1.2)
Total Protein: 7.9 g/dL (ref 6.5–8.1)

## 2021-04-05 LAB — URINALYSIS, ROUTINE W REFLEX MICROSCOPIC
Bacteria, UA: NONE SEEN
Bilirubin Urine: NEGATIVE
Glucose, UA: NEGATIVE mg/dL
Hgb urine dipstick: NEGATIVE
Ketones, ur: 5 mg/dL — AB
Nitrite: NEGATIVE
Protein, ur: NEGATIVE mg/dL
Specific Gravity, Urine: 1.011 (ref 1.005–1.030)
pH: 5 (ref 5.0–8.0)

## 2021-04-05 LAB — LIPASE, BLOOD: Lipase: 36 U/L (ref 11–51)

## 2021-04-05 NOTE — Patient Instructions (Signed)

## 2021-04-05 NOTE — ED Triage Notes (Signed)
Patient c/o lower left sided abdominal pain that radiates to back X1 week. Seen at Urgent care previously for same. Reports some nausea. Denies V/D

## 2021-04-05 NOTE — Progress Notes (Signed)
   Follow-Up Visit   Subjective  Mary Schaefer is a 60 y.o. female who presents for the following: Follow-up (6 months f/u hair loss, pt report hair loss improving since she started a low carb diet, no much help from using Rogaine ). Check a few moles on the back and legs.    The following portions of the chart were reviewed this encounter and updated as appropriate:   Tobacco  Allergies  Meds  Problems  Med Hx  Surg Hx  Fam Hx      Review of Systems:  No other skin or systemic complaints except as noted in HPI or Assessment and Plan.  Objective  Well appearing patient in no apparent distress; mood and affect are within normal limits.  A focused examination was performed including face, legs,back. Relevant physical exam findings are noted in the Assessment and Plan.  right medial knee, right medial calf Firm pink/brown papules with dimple sign.    Assessment & Plan  Telogen effluvium scalp  Doing well, hair regrowing. Has likely run its course. Patient did supplement with vitamin D and levels are now >50, optimal for hair regrowth  Telogen effluvium is a benign, self limited condition causing increased hair shedding usually for several months. It does not progress to baldness, and the hair eventually grows back on its own. It can be triggered by recent illness, recent surgery, thyroid disease, low iron stores, vitamin D deficiency, fad diets or rapid weight loss, hormonal changes such as pregnancy or birth control pills, and some medication. Usually the hair loss starts 2-3 months after the illness or health change. Rarely, it can continue for longer than a year.   Dermatofibroma right medial knee, right medial calf  Dermatofibroma with symptoms and/or recent change.  Discussed surgical excision to remove, including resulting scar and possible recurrence.  Patient will schedule for surgery. Pre-op information given.    Seborrheic Keratoses - Stuck-on, waxy, tan-brown  papules and/or plaques  - Benign-appearing - Discussed benign etiology and prognosis. - Observe - Call for any changes  Melanocytic Nevi - Tan-brown and/or pink-flesh-colored symmetric macules and papules - Benign appearing on exam today - Observation - Call clinic for new or changing moles - Recommend daily use of broad spectrum spf 30+ sunscreen to sun-exposed areas.     Hemangiomas - Red papules - Discussed benign nature - Observe - Call for any changes   Actinic Damage - chronic, secondary to cumulative UV radiation exposure/sun exposure over time - diffuse scaly erythematous macules with underlying dyspigmentation - Recommend daily broad spectrum sunscreen SPF 30+ to sun-exposed areas, reapply every 2 hours as needed.  - Recommend staying in the shade or wearing long sleeves, sun glasses (UVA+UVB protection) and wide brim hats (4-inch brim around the entire circumference of the hat). - Call for new or changing lesions.   Return if symptoms worsen or fail to improve.  I, Marye Round, CMA, am acting as scribe for Forest Gleason, MD .   Documentation: I have reviewed the above documentation for accuracy and completeness, and I agree with the above.  Forest Gleason, MD

## 2021-04-05 NOTE — ED Provider Notes (Signed)
Emergency Medicine Provider Triage Evaluation Note  Mary Schaefer , a 60 y.o. female  was evaluated in triage.  Pt complains of about 1 week of pain in her left pelvic area radiating up towards her left flank.  She has been feeling nauseous but denies fever, vomiting, dysuria, hematuria, or vaginal bleeding..  Review of Systems  Positive: Abdominal and flank pain, nausea. Negative: Fever, vomiting, dysuria, vaginal bleeding.  Physical Exam  BP 118/81 (BP Location: Left Arm)   Pulse 93   Temp 98.3 F (36.8 C) (Oral)   Resp 16   Ht 5\' 6"  (1.676 m)   Wt 78 kg   LMP 09/10/2016   SpO2 100%   BMI 27.76 kg/m  Gen:   Awake, no distress  Resp:  Normal effort, clear to auscultation bilaterally. MSK:   Moves extremities without difficulty, 2+ radial pulses bilaterally. Other:  No abdominal or CVA tenderness to palpation.  Medical Decision Making  Medically screening exam initiated at 3:07 PM.  Appropriate orders placed.  Mary Schaefer was informed that the remainder of the evaluation will be completed by another provider, this initial triage assessment does not replace that evaluation, and the importance of remaining in the ED until their evaluation is complete.  60 year old female presents to the ED complaining of 1 week of pain in her left groin radiating towards her left flank.  We will check labs, UA, and CT renal protocol.   Blake Divine, MD 04/05/21 [phone]

## 2021-04-06 ENCOUNTER — Ambulatory Visit (INDEPENDENT_AMBULATORY_CARE_PROVIDER_SITE_OTHER): Payer: BC Managed Care – PPO | Admitting: Internal Medicine

## 2021-04-06 ENCOUNTER — Encounter: Payer: Self-pay | Admitting: Internal Medicine

## 2021-04-06 ENCOUNTER — Other Ambulatory Visit: Payer: Self-pay

## 2021-04-06 VITALS — BP 97/72 | HR 84 | Temp 97.5°F | Resp 17 | Ht 66.0 in | Wt 173.0 lb

## 2021-04-06 DIAGNOSIS — M545 Low back pain, unspecified: Secondary | ICD-10-CM

## 2021-04-06 DIAGNOSIS — R102 Pelvic and perineal pain: Secondary | ICD-10-CM | POA: Diagnosis not present

## 2021-04-06 DIAGNOSIS — R1032 Left lower quadrant pain: Secondary | ICD-10-CM

## 2021-04-06 DIAGNOSIS — I700 Atherosclerosis of aorta: Secondary | ICD-10-CM | POA: Insufficient documentation

## 2021-04-06 MED ORDER — TRAMADOL HCL 50 MG PO TABS: 50.0000 mg | ORAL_TABLET | Freq: Three times a day (TID) | ORAL | 0 refills | Status: AC | PRN

## 2021-04-06 NOTE — Patient Instructions (Signed)
Pelvic Pain, Female Pelvic pain is pain in your lower belly (abdomen), below your belly button and between your hips. The pain may start suddenly (be acute), keep coming back (be recurring), or last a long time (become chronic). Pelvic pain that lasts longer than 6 months is called chronic pelvic pain. There are many causes of pelvic pain. Sometimes the cause of pelvic pain is not known. Follow these instructions at home:  Take over-the-counter and prescription medicines only as told by your doctor. Rest as told by your doctor. Do not have sex if it hurts. Keep a journal of your pelvic pain. Write down: When the pain started. Where the pain is located. What seems to make the pain better or worse, such as food or your period (menstrual cycle). Any symptoms you have along with the pain. Keep all follow-up visits as told by your doctor. This is important. Contact a doctor if: Medicine does not help your pain. Your pain comes back. You have new symptoms. You have unusual discharge or bleeding from your vagina. You have a fever or chills. You are having trouble pooping (constipation). You have blood in your pee (urine) or poop (stool). Your pee smells bad. You feel weak or light-headed. Get help right away if: You have sudden pain that is very bad. Your pain keeps getting worse. You have very bad pain and also have any of these symptoms: A fever. Feeling sick to your stomach (nausea). Throwing up (vomiting). Being very sweaty. You pass out (lose consciousness). Summary Pelvic pain is pain in your lower belly (abdomen), below your belly button and between your hips. There are many possible causes of pelvic pain. Keep a journal of your pelvic pain. This information is not intended to replace advice given to you by your health care provider. Make sure you discuss any questions you have with your health care provider. Document Revised: 11/14/2017 Document Reviewed: 11/14/2017 Elsevier  Patient Education  [place].

## 2021-04-06 NOTE — Telephone Encounter (Signed)
Copied from [place] [phone]. Topic: General - Other >> Apr 06, 2021 11:25 AM Mary Schaefer wrote: Reason for CRM: Pt reports that she has not heard from anyone regarding the CT that is suppose to be done today. Cb# [phone]

## 2021-04-06 NOTE — Telephone Encounter (Signed)
Any update on this patient CT scan?

## 2021-04-06 NOTE — Telephone Encounter (Signed)
Patient was seen in office before this message was reviewed

## 2021-04-06 NOTE — Progress Notes (Signed)
Subjective:    Patient ID: Mary Schaefer, female    DOB: Oct 04, 1960, 60 y.o.   MRN: [id number]  HPI  Patient presents the clinic today for ER follow-up.  She went to the urgent care 10/24 with complaint left-sided low back pain that radiated into the left groin.  Labs showed small leukocytes in the urine but no other acute findings.  She was advised to go to the ER for further evaluation with a CT renal stone study.  She went to the ER and  had a CT renal stone study 10/26 showed a punctate nonobstructing left kidney stone.  She left without being seen because the wait was too long.  She reports the pain starts in the left groin and radiates around to her left buttock.  She describes the pain as a constant ache and also what feels like menstrual cramps.  The pain is not worse with movement.  Eating does not affect the pain at all.  She denies any issues with her bowel or bladder.  Having a bowel movement does not make the pain better.  She has not noticed any rash in the area she denies any injury to the left hip or groin area.  She has tried Ibuprofen OTC with minimal relief of symptoms.  Review of Systems     Past Medical History:  Diagnosis Date   Blood in stool    Childhood asthma    Diabetes mellitus without complication ([place])    Endometriosis 1997   Hypertension    Thyroid disease     Current Outpatient Medications  Medication Sig Dispense Refill   acetaminophen (TYLENOL) 500 MG tablet Take 1,000 mg by mouth as needed for mild pain.     Ascorbic Acid (VITAMIN C PO) Take 1 tablet by mouth daily.     busPIRone (BUSPAR) 7.5 MG tablet TAKE 1 TABLET BY MOUTH TWICE A DAY 180 tablet 0   Calcium Carbonate-Vitamin D3 600-400 MG-UNIT TABS Take by mouth.     cyanocobalamin 2000 MCG tablet Take 2,000 mcg by mouth 2 (two) times daily.     gabapentin (NEURONTIN) 100 MG capsule TAKE 1 CAPSULE BY MOUTH EVERYDAY AT BEDTIME 90 capsule 1   glucose blood test strip Check sugar BID as needed. DX  E11.9 100 each 6   glucose blood test strip Use as instructed 100 each 12   levothyroxine (SYNTHROID) 112 MCG tablet TAKE 1 TABLET BY MOUTH EVERY DAY(NEED APPT FOR LAB) 90 tablet 0   lisinopril-hydrochlorothiazide (ZESTORETIC) 10-12.5 MG tablet Take 1 tablet by mouth daily. 90 tablet 2   metFORMIN (GLUCOPHAGE XR) 500 MG 24 hr tablet Take 2 tablets (1,000 mg total) by mouth in the morning and at bedtime. 240 tablet 2   omeprazole (PRILOSEC) 10 MG capsule Take 10 mg by mouth daily.     prednisoLONE acetate (PRED FORTE) 1 % ophthalmic suspension INSTILL 1 DROP INTO LEFT EYE 4 TIMES A DAY     rosuvastatin (CRESTOR) 10 MG tablet TAKE 1 TABLET BY MOUTH EVERY DAY 90 tablet 0   No current facility-administered medications for this visit.    Allergies  Allergen Reactions   Aspirin Shortness Of Breath and Hives   Penicillins Hives    Family History  Problem Relation Age of Onset   Ovarian cancer Mother    Hyperlipidemia Mother    Alcohol abuse Father    Hyperlipidemia Father    Heart disease Father    Stroke Father    Hypertension Father  Diabetes Sister    Colon cancer Neg Hx    Esophageal cancer Neg Hx     Social History   Socioeconomic History   Marital status: Married    Spouse name: Not on file   Number of children: Not on file   Years of education: Not on file   Highest education level: Not on file  Occupational History   Not on file  Tobacco Use   Smoking status: Never   Smokeless tobacco: Never  Vaping Use   Vaping Use: Never used  Substance and Sexual Activity   Alcohol use: Yes    Alcohol/week: 1.0 - 2.0 standard drink    Types: 1 - 2 Glasses of wine per week    Comment: rare   Drug use: Not Currently   Sexual activity: Yes  Other Topics Concern   Not on file  Social History Narrative   Not on file   Social Determinants of Health   Financial Resource Strain: Not on file  Food Insecurity: Not on file  Transportation Needs: Not on file  Physical  Activity: Not on file  Stress: Not on file  Social Connections: Not on file  Intimate Partner Violence: Not on file     Constitutional: Denies fever, malaise, fatigue, headache or abrupt weight changes.  Respiratory: Denies difficulty breathing, shortness of breath, cough or sputum production.   Cardiovascular: Denies chest pain, chest tightness, palpitations or swelling in the hands or feet.  Gastrointestinal: Patient reports nausea- related to pain, pelvic pain.  Denies bloating, constipation, diarrhea or blood in the stool.  GU: Denies urgency, frequency, pain with urination, burning sensation, blood in urine, odor or discharge. Musculoskeletal: Patient reports left groin pain.  Denies decrease in range of motion, difficulty with gait, muscle pain or joint pain and swelling.  Skin: Denies redness, rashes, lesions or ulcercations.    No other specific complaints in a complete review of systems (except as listed in HPI above).  Objective:   Physical Exam  BP 97/72 (BP Location: Right Arm, Patient Position: Sitting, Cuff Size: Normal)   Pulse 84   Temp (!) 97.5 F (36.4 C) (Temporal)   Resp 17   Ht 5\' 6"  (1.676 m)   Wt 173 lb (78.5 kg)   LMP 09/10/2016   SpO2 98%   BMI 27.92 kg/m   Wt Readings from Last 3 Encounters:  04/05/21 172 lb (78 kg)  02/10/21 179 lb 12.8 oz (81.6 kg)  01/03/21 181 lb 9.6 oz (82.4 kg)    General: Appears her stated age, overweight, appears in pain but in NAD. Skin: Warm, dry and intact. No rashes noted. Cardiovascular: Normal rate and rhythm. S1,S2 noted.  No murmur, rubs or gallops noted.  Pulmonary/Chest: Normal effort and positive vesicular breath sounds. No respiratory distress. No wheezes, rales or ronchi noted.  Abdomen: Soft and tender in the LLQ and left groin. Normal bowel sounds. No distention or masses noted.  Musculoskeletal: Normal flexion, extension, rotation of the lumbar spine.  No bony tenderness noted over the lumbar spine.  Pain  with palpation of the upper left buttock.  No difficulty with gait.  Neurological: Alert and oriented.    BMET    Component Value Date/Time   NA 137 04/05/2021 1500   K 3.8 04/05/2021 1500   CL 100 04/05/2021 1500   CO2 27 04/05/2021 1500   GLUCOSE 115 (H) 04/05/2021 1500   BUN 14 04/05/2021 1500   CREATININE 0.85 04/05/2021 1500   CREATININE 0.65  02/10/2021 0925   CALCIUM 9.7 04/05/2021 1500   GFRNONAA >60 04/05/2021 1500   GFRAA >60 09/17/2017 0732    Lipid Panel     Component Value Date/Time   CHOL 326 (H) 07/15/2020 1614   TRIG (H) 07/15/2020 1614    421.0 Triglyceride is over 400; calculations on Lipids are invalid.   HDL 47.20 07/15/2020 1614   CHOLHDL 7 07/15/2020 1614   VLDL 43.2 (H) 09/02/2019 0809   LDLCALC 122 (H) 05/15/2019 0905   LDLCALC 219 (H) 05/24/2018 1609    CBC    Component Value Date/Time   WBC 6.1 04/05/2021 1500   RBC 5.09 04/05/2021 1500   HGB 15.8 (H) 04/05/2021 1500   HCT 45.8 04/05/2021 1500   PLT 284 04/05/2021 1500   MCV 90.0 04/05/2021 1500   MCH 31.0 04/05/2021 1500   MCHC 34.5 04/05/2021 1500   RDW 12.3 04/05/2021 1500    Hgb A1C Lab Results  Component Value Date   HGBA1C 9.9 (H) 02/10/2021            Assessment & Plan:   ER follow-up for Left-Sided Low Back Pain, Left Groin Pain:  ER notes, labs and imaging reviewed Lab work-up does not seem consistent with UTI No evidence of ovarian cysts noted on CT scan She does have some uterine fibroids, has a follow-up with GYN next week Will obtain stat CT abdomen pelvis with contrast for further evaluation, question femoral hernia Rx for Tramadol 50 mg every 8 hours as needed Okay to continue Ibuprofen OTC  Return precautions discussed Webb Silversmith, NP This visit occurred during the SARS-CoV-2 public health emergency.  Safety protocols were in place, including screening questions prior to the visit, additional usage of staff PPE, and extensive cleaning of exam room while  observing appropriate contact time as indicated for disinfecting solutions.

## 2021-04-14 ENCOUNTER — Other Ambulatory Visit: Payer: Self-pay

## 2021-04-14 ENCOUNTER — Encounter: Payer: Self-pay | Admitting: Obstetrics & Gynecology

## 2021-04-14 ENCOUNTER — Ambulatory Visit (INDEPENDENT_AMBULATORY_CARE_PROVIDER_SITE_OTHER): Payer: BC Managed Care – PPO | Admitting: Obstetrics & Gynecology

## 2021-04-14 VITALS — BP 119/77 | HR 88 | Wt 171.0 lb

## 2021-04-14 DIAGNOSIS — R102 Pelvic and perineal pain: Secondary | ICD-10-CM

## 2021-04-14 NOTE — Progress Notes (Signed)
GYNECOLOGY OFFICE VISIT NOTE  History:   Mary Schaefer is a 60 y.o. [phone] here today for evaluation of two week history of pain in her left pelvic area radiating up towards her left flank and upper back. Also pain in lower pelvis that reminded her of 'menstrual pain', but she has not had any bleeding for many years.  She has been feeling nauseous but denies fever, vomiting, dysuria, hematuria She was evaluated in the ED on 04/05/21, CT scan showed possible small uterine fibroids.  She has a family history of ovarian cancer, worried about her ovaries even though they were normal on CT scan. She desires pelvic ultrasound.  No bleeding or other gynecologic concerns.    Past Medical History:  Diagnosis Date   Blood in stool    Childhood asthma    Diabetes mellitus without complication ([place])    Endometriosis 1997   Hypertension    Thyroid disease     Past Surgical History:  Procedure Laterality Date   CORNEAL TRANSPLANT     TUBAL LIGATION  1998    The following portions of the patient's history were reviewed and updated as appropriate: allergies, current medications, past family history, past medical history, past social history, past surgical history and problem list.   Health Maintenance:  She reports normal pap in the last three years and normal mammogram in 03/14/2021.  Review of Systems:  Pertinent items noted in HPI and remainder of comprehensive ROS otherwise negative.  Physical Exam:  BP 119/77   Pulse 88   Wt 171 lb (77.6 kg)   LMP 09/10/2016   BMI 27.60 kg/m  CONSTITUTIONAL: Well-developed, well-nourished female in no acute distress.  HEENT:  Normocephalic, atraumatic. External right and left ear normal. No scleral icterus.  NECK: Normal range of motion, supple, no masses noted on observation SKIN: No rash noted. Not diaphoretic. No erythema. No pallor. MUSCULOSKELETAL: Normal range of motion. No edema noted. NEUROLOGIC: Alert and oriented to person, place, and  time. Normal muscle tone coordination. No cranial nerve deficit noted. PSYCHIATRIC: Normal mood and affect. Normal behavior. Normal judgment and thought content. CARDIOVASCULAR: Normal heart rate noted RESPIRATORY: Effort and breath sounds normal, no problems with respiration noted ABDOMEN: No masses noted. No other overt distention noted.   PELVIC: Normal appearing external genitalia; normal urethral meatus; normal appearing distal vaginal mucosa.  No abnormal discharge noted.  Normal uterine size, no other palpable masses, no uterine or adnexal tenderness. Performed in the presence of a chaperone  Labs and Imaging CT Renal Stone Study  Result Date: 04/05/2021 CLINICAL DATA:  Left-sided flank pain EXAM: CT ABDOMEN AND PELVIS WITHOUT CONTRAST TECHNIQUE: Multidetector CT imaging of the abdomen and pelvis was performed following the standard protocol without IV contrast. COMPARISON:  CT 09/17/2017 FINDINGS: Lower chest: No acute abnormality. Hepatobiliary: No focal liver abnormality is seen. No gallstones, gallbladder wall thickening, or biliary dilatation. Pancreas: Unremarkable. No pancreatic ductal dilatation or surrounding inflammatory changes. Spleen: Normal in size without focal abnormality. Adrenals/Urinary Tract: Adrenal glands are unremarkable. No hydronephrosis. Punctate nonobstructing stone in the mid left kidney. The bladder is unremarkable Stomach/Bowel: Stomach is within normal limits. Appendix appears normal. No evidence of bowel wall thickening, distention, or inflammatory changes. Vascular/Lymphatic: Mild aortic atherosclerosis. No aneurysm. No suspicious nodes Reproductive: Uterus contains probable small fibroids. No adnexal mass Other: No abdominal wall hernia or abnormality. No abdominopelvic ascites. Musculoskeletal: No acute or significant osseous findings. IMPRESSION: 1. Negative for hydronephrosis or ureteral stone. 2. Punctate nonobstructing stone  left kidney 3. Probable uterine  fibroids Electronically Signed   By: Donavan Foil M.D.   On: 04/05/2021 15:55      Assessment and Plan:     1. Pelvic pain Unlikely that this pain is of GYN etiology but will obtain pelvic ultrasound to further delineate possible fibroids seen on CT scan.  Patient will also follow up with Gastroenterology soon to look for other possible etiologies.  - US PELVIC COMPLETE WITH TRANSVAGINAL; Future  Routine preventative health maintenance measures emphasized. Please refer to After Visit Summary for other counseling recommendations.   Return for any gynecologic concerns.    I spent 20 minutes dedicated to the care of this patient including pre-visit review of records, face to face time with the patient discussing her conditions and treatments and post visit orders.    Verita Schneiders, MD, [place] for [employer], [place]

## 2021-04-15 ENCOUNTER — Other Ambulatory Visit: Payer: Self-pay | Admitting: Internal Medicine

## 2021-04-15 NOTE — Telephone Encounter (Signed)
Requested Prescriptions  Pending Prescriptions Disp Refills  . rosuvastatin (CRESTOR) 10 MG tablet [Pharmacy Med Name: ROSUVASTATIN CALCIUM 10 MG TAB] 90 tablet 1    Sig: TAKE 1 TABLET BY MOUTH EVERY DAY     Cardiovascular:  Antilipid - Statins Failed - 04/15/2021  5:18 AM      Failed - Total Cholesterol in normal range and within 360 days    Cholesterol  Date Value Ref Range Status  07/15/2020 326 (H) 0 - 200 mg/dL Final    Comment:    ATP III Classification       Desirable:  < 200 mg/dL               Borderline High:  200 - 239 mg/dL          High:  > = 240 mg/dL         Failed - Triglycerides in normal range and within 360 days    Triglycerides  Date Value Ref Range Status  07/15/2020 (H) 0.0 - 149.0 mg/dL Final   421.0 Triglyceride is over 400; calculations on Lipids are invalid.    Comment:    Normal:  <150 mg/dLBorderline High:  150 - 199 mg/dL         Passed - LDL in normal range and within 360 days    LDL Cholesterol (Calc)  Date Value Ref Range Status  05/24/2018 219 (H) mg/dL (calc) Final    Comment:    LDL-C levels > or = 190 mg/dL may indicate familial  hypercholesterolemia (FH). Clinical assessment and  measurement of blood lipid levels should be  considered for all first degree relatives of  patients with an FH diagnosis.  For questions about testing for familial hypercholesterolemia, please call Insurance risk surveyor at [email or web address]. Duncan Dull, et al. J National Lipid Association  Recommendations for Patient-Centered Management of  Dyslipidemia: Part 1 Journal of Clinical Lipidology  2015;9(2), 129-169. Reference range: <100 . Desirable range <100 mg/dL for primary prevention;   <70 mg/dL for patients with CHD or diabetic patients  with > or = 2 CHD risk factors. [name] LDL-C is now calculated using the Martin-Hopkins  calculation, which is a validated novel method providing  better accuracy than the Friedewald equation in the  estimation of  LDL-C.  Cresenciano Genre et al. Annamaria Helling. 5027;741(28): 2061-2068  (http://education.QuestDiagnostics.com/faq/FAQ164)    LDL Cholesterol  Date Value Ref Range Status  05/15/2019 122 (H) 0 - 99 mg/dL Final   Direct LDL  Date Value Ref Range Status  07/15/2020 206.0 mg/dL Final    Comment:    Optimal:  <100 mg/dLNear or Above Optimal:  100-129 mg/dLBorderline High:  130-159 mg/dLHigh:  160-189 mg/dLVery High:  >190 mg/dL         Passed - HDL in normal range and within 360 days    HDL  Date Value Ref Range Status  07/15/2020 47.20 >39.00 mg/dL Final         Passed - Patient is not pregnant      Passed - Valid encounter within last 12 months    Recent Outpatient Visits          1 week ago LLQ pain   [facility] [place], Coralie Keens, NP   2 months ago Type 2 diabetes mellitus with hyperglycemia, without long-term current use of insulin [facility])   [facility] [place], Coralie Keens, NP   5 months ago Left hip pain   [place],  Coralie Keens, NP

## 2021-04-18 ENCOUNTER — Other Ambulatory Visit: Payer: Self-pay

## 2021-04-18 ENCOUNTER — Ambulatory Visit
Admission: RE | Admit: 2021-04-18 | Discharge: 2021-04-18 | Disposition: A | Discharge status: Home / Self Care | Payer: BC Managed Care – PPO | Source: Ambulatory Visit | Attending: Obstetrics & Gynecology | Admitting: Obstetrics & Gynecology

## 2021-04-18 DIAGNOSIS — R102 Pelvic and perineal pain: Secondary | ICD-10-CM | POA: Insufficient documentation

## 2021-04-18 DIAGNOSIS — D259 Leiomyoma of uterus, unspecified: Secondary | ICD-10-CM | POA: Diagnosis not present

## 2021-04-18 DIAGNOSIS — N888 Other specified noninflammatory disorders of cervix uteri: Secondary | ICD-10-CM | POA: Diagnosis not present

## 2021-04-19 ENCOUNTER — Telehealth: Payer: Self-pay | Admitting: *Deleted

## 2021-04-19 NOTE — Telephone Encounter (Signed)
Pt informed of US results.

## 2021-04-19 NOTE — Telephone Encounter (Signed)
-----   Message from Osborne Oman, MD sent at 04/19/2021  8:16 AM EST ----- Tiny 1.5 cm fibroid seen, in the fundal area, and likely has been present for years.   Not responsible for level of pain noted by patient.  No GYN etiology for her pain noted. Please call to inform patient of results and recommendations.

## 2021-05-15 ENCOUNTER — Other Ambulatory Visit: Payer: Self-pay | Admitting: Internal Medicine

## 2021-05-15 DIAGNOSIS — E039 Hypothyroidism, unspecified: Secondary | ICD-10-CM

## 2021-05-15 NOTE — Telephone Encounter (Signed)
Requested Prescriptions  Pending Prescriptions Disp Refills  . levothyroxine (SYNTHROID) 112 MCG tablet [Pharmacy Med Name: LEVOTHYROXINE 112 MCG TABLET] 90 tablet 2    Sig: TAKE 1 TABLET BY MOUTH EVERY DAY(NEED APPT FOR LAB)     Endocrinology:  Hypothyroid Agents Failed - 05/15/2021 12:48 AM      Failed - TSH needs to be rechecked within 3 months after an abnormal result. Refill until TSH is due.      Passed - TSH in normal range and within 360 days    TSH  Date Value Ref Range Status  02/10/2021 3.42 0.40 - 4.50 mIU/L Final         Passed - Valid encounter within last 12 months    Recent Outpatient Visits          1 month ago LLQ pain   [facility] [place], [place] W, NP   3 months ago Type 2 diabetes mellitus with hyperglycemia, without long-term current use of insulin [facility])   [facility] [place], Coralie Keens, NP   6 months ago Left hip pain   [facility] [place], Coralie Keens, [place]

## 2021-05-16 ENCOUNTER — Other Ambulatory Visit: Payer: Self-pay | Admitting: Internal Medicine

## 2021-05-16 ENCOUNTER — Ambulatory Visit: Payer: BC Managed Care – PPO | Admitting: Gastroenterology

## 2021-05-16 DIAGNOSIS — I10 Essential (primary) hypertension: Secondary | ICD-10-CM

## 2021-05-16 NOTE — Telephone Encounter (Signed)
Requested Prescriptions  Pending Prescriptions Disp Refills  . lisinopril-hydrochlorothiazide (ZESTORETIC) 10-12.5 MG tablet [Pharmacy Med Name: LISINOPRIL-HCTZ 10-12.5 MG TAB] 90 tablet 2    Sig: TAKE 1 TABLET BY MOUTH EVERY DAY     Cardiovascular:  ACEI + Diuretic Combos Passed - 05/16/2021 12:42 PM      Passed - Na in normal range and within 180 days    Sodium  Date Value Ref Range Status  04/05/2021 137 135 - 145 mmol/L Final         Passed - K in normal range and within 180 days    Potassium  Date Value Ref Range Status  04/05/2021 3.8 3.5 - 5.1 mmol/L Final         Passed - Cr in normal range and within 180 days    Creat  Date Value Ref Range Status  02/10/2021 0.65 0.50 - 1.03 mg/dL Final   Creatinine, Ser  Date Value Ref Range Status  04/05/2021 0.85 0.44 - 1.00 mg/dL Final   Creatinine,U  Date Value Ref Range Status  02/12/2019 75.8 mg/dL Final         Passed - Ca in normal range and within 180 days    Calcium  Date Value Ref Range Status  04/05/2021 9.7 8.9 - 10.3 mg/dL Final         Passed - Patient is not pregnant      Passed - Last BP in normal range    BP Readings from Last 1 Encounters:  04/14/21 119/77         Passed - Valid encounter within last 6 months    Recent Outpatient Visits          1 month ago LLQ pain   [place], Coralie Keens, NP   3 months ago Type 2 diabetes mellitus with hyperglycemia, without long-term current use of insulin [facility])   [facility] [place], NP   6 months ago Left hip pain   [facility] [place], Coralie Keens, [place]

## 2021-06-03 DIAGNOSIS — Z947 Corneal transplant status: Secondary | ICD-10-CM | POA: Diagnosis not present

## 2021-06-10 ENCOUNTER — Other Ambulatory Visit: Payer: Self-pay | Admitting: Internal Medicine

## 2021-06-10 NOTE — Telephone Encounter (Signed)
  Notes to clinic:  pharmacy requesting alternative, please assess  Requested Prescriptions  Pending Prescriptions Disp Refills   ACCU-CHEK GUIDE test strip [Pharmacy Med Name: ACCU-CHEK GUIDE TEST STRIP] 100 strip 12    Sig: USE AS INSTRUCTED     Endocrinology: Diabetes - Testing Supplies Passed - 06/10/2021  2:47 PM      Passed - Valid encounter within last 12 months    Recent Outpatient Visits           2 months ago LLQ pain   [facility] [place], [place] W, NP   4 months ago Type 2 diabetes mellitus with hyperglycemia, without long-term current use of insulin [facility])   [facility] [place], Coralie Keens, NP   7 months ago Left hip pain   [facility] [place], Coralie Keens, [place]

## 2021-06-14 ENCOUNTER — Other Ambulatory Visit: Payer: Self-pay | Admitting: Internal Medicine

## 2021-06-15 NOTE — Telephone Encounter (Signed)
Requested medication (s) are due for refill today:   Ordered yesterday but pharmacy requiring a prior auth.  Requested medication (s) are on the active medication list:   Yes  Future visit scheduled:   No   Last ordered: Yesterday 06/14/2021  Returned because a prior Josem Kaufmann is needed.   Requested Prescriptions  Pending Prescriptions Disp Refills   ACCU-CHEK GUIDE test strip [Pharmacy Med Name: ACCU-CHEK GUIDE TEST STRIP] 100 strip 12    Sig: USE AS INSTRUCTED     Endocrinology: Diabetes - Testing Supplies Passed - 06/14/2021 12:13 PM      Passed - Valid encounter within last 12 months    Recent Outpatient Visits           2 months ago LLQ pain   [facility] [place], [place] W, NP   4 months ago Type 2 diabetes mellitus with hyperglycemia, without long-term current use of insulin [facility])   [facility] [place], Coralie Keens, NP   7 months ago Left hip pain   [facility] [place], Coralie Keens, [place]

## 2021-06-23 ENCOUNTER — Ambulatory Visit: Payer: BC Managed Care – PPO | Admitting: Gastroenterology

## 2021-08-08 ENCOUNTER — Other Ambulatory Visit: Payer: Self-pay | Admitting: Internal Medicine

## 2021-08-09 NOTE — Telephone Encounter (Signed)
Requested medication (s) are due for refill today:   Provider to review  Requested medication (s) are on the active medication list:   Yes but the doses on the list and the one being requested are different  Future visit scheduled:   Not sure    Last ordered: 02/18/2021 #240, 2 refills  Returned because dose clarification is needed.   On med list it's 500 mg 2 in the morning and 2 in the evening.   This one is requesting 1 tab. In morning and 1 tab in the evening.   Requested Prescriptions  Pending Prescriptions Disp Refills   metFORMIN (GLUCOPHAGE-XR) 500 MG 24 hr tablet [Pharmacy Med Name: METFORMIN HCL ER 500 MG TABLET] 180 tablet 2    Sig: TAKE 1 TABLET BY MOUTH TWICE A DAY     There is no refill protocol information for this order

## 2021-08-22 ENCOUNTER — Other Ambulatory Visit: Payer: Self-pay | Admitting: Internal Medicine

## 2021-08-23 NOTE — Telephone Encounter (Signed)
Requested Prescriptions  Pending Prescriptions Disp Refills  . gabapentin (NEURONTIN) 100 MG capsule [Pharmacy Med Name: GABAPENTIN 100 MG CAPSULE] 90 capsule 1    Sig: TAKE 1 CAPSULE BY MOUTH EVERYDAY AT BEDTIME     Neurology: Anticonvulsants - gabapentin Passed - 08/23/2021 11:57 AM      Passed - Cr in normal range and within 360 days    Creat  Date Value Ref Range Status  02/10/2021 0.65 0.50 - 1.03 mg/dL Final   Creatinine, Ser  Date Value Ref Range Status  04/05/2021 0.85 0.44 - 1.00 mg/dL Final   Creatinine,U  Date Value Ref Range Status  02/12/2019 75.8 mg/dL Final         Passed - Completed PHQ-2 or PHQ-9 in the last 360 days      Passed - Valid encounter within last 12 months    Recent Outpatient Visits          4 months ago LLQ pain   [facility] [place], Coralie Keens, NP   6 months ago Type 2 diabetes mellitus with hyperglycemia, without long-term current use of insulin [facility])   [facility] [place], Coralie Keens, NP   10 months ago Left hip pain   [facility] [place], Coralie Keens, NP      Future Appointments            In 1 week Garnette Gunner, Coralie Keens, NP [facility], [facility]

## 2021-08-30 ENCOUNTER — Encounter: Payer: BC Managed Care – PPO | Admitting: Internal Medicine

## 2021-09-30 DIAGNOSIS — Z947 Corneal transplant status: Secondary | ICD-10-CM | POA: Diagnosis not present

## 2021-09-30 DIAGNOSIS — E119 Type 2 diabetes mellitus without complications: Secondary | ICD-10-CM | POA: Diagnosis not present

## 2021-09-30 DIAGNOSIS — H52213 Irregular astigmatism, bilateral: Secondary | ICD-10-CM | POA: Diagnosis not present

## 2021-09-30 DIAGNOSIS — H524 Presbyopia: Secondary | ICD-10-CM | POA: Diagnosis not present

## 2021-09-30 DIAGNOSIS — H5213 Myopia, bilateral: Secondary | ICD-10-CM | POA: Diagnosis not present

## 2021-10-07 ENCOUNTER — Encounter: Payer: BC Managed Care – PPO | Admitting: Internal Medicine

## 2021-10-31 ENCOUNTER — Other Ambulatory Visit: Payer: Self-pay | Admitting: Internal Medicine

## 2021-11-01 NOTE — Telephone Encounter (Signed)
Requested medication (s) are due for refill today: yes  Requested medication (s) are on the active medication list: yes  Last refill:  08/09/21 #180  Future visit scheduled: yes  Notes to clinic:  overdue lab work   Requested Prescriptions  Pending Prescriptions Disp Refills   metFORMIN (GLUCOPHAGE-XR) 500 MG 24 hr tablet [Pharmacy Med Name: METFORMIN HCL ER 500 MG TABLET] 360 tablet 1    Sig: TAKE 2 TABLETS (1,000 MG TOTAL) BY MOUTH IN THE MORNING AND AT BEDTIME.     Endocrinology:  Diabetes - Biguanides Failed - 10/31/2021  2:24 AM      Failed - HBA1C is between 0 and 7.9 and within 180 days    Hgb A1c MFr Bld  Date Value Ref Range Status  02/10/2021 9.9 (H) <5.7 % of total Hgb Final    Comment:    For someone without known diabetes, a hemoglobin A1c value of 6.5% or greater indicates that they may have  diabetes and this should be confirmed with a follow-up  test. . For someone with known diabetes, a value <7% indicates  that their diabetes is well controlled and a value  greater than or equal to 7% indicates suboptimal  control. A1c targets should be individualized based on  duration of diabetes, age, comorbid conditions, and  other considerations. . Currently, no consensus exists regarding use of hemoglobin A1c for diagnosis of diabetes for children. .          Failed - B12 Level in normal range and within 720 days    Vitamin B-12  Date Value Ref Range Status  02/10/2021 1,542 (H) 200 - 1,100 pg/mL Final         Failed - Valid encounter within last 6 months    Recent Outpatient Visits           6 months ago LLQ pain   [facility] [place], Coralie Keens, NP   8 months ago Type 2 diabetes mellitus with hyperglycemia, without long-term current use of insulin ([place])   [facility] [place], Coralie Keens, NP   1 year ago Left hip pain   [facility] [place], Coralie Keens, NP       Future Appointments             In 2 weeks [place],  Coralie Keens, NP [facility]             Failed - CBC within normal limits and completed in the last 12 months    WBC  Date Value Ref Range Status  04/05/2021 6.1 4.0 - 10.5 K/uL Final   RBC  Date Value Ref Range Status  04/05/2021 5.09 3.87 - 5.11 MIL/uL Final   Hemoglobin  Date Value Ref Range Status  04/05/2021 15.8 (H) 12.0 - 15.0 g/dL Final   HCT  Date Value Ref Range Status  04/05/2021 45.8 36.0 - 46.0 % Final   MCHC  Date Value Ref Range Status  04/05/2021 34.5 30.0 - 36.0 g/dL Final   [facility]  Date Value Ref Range Status  04/05/2021 31.0 26.0 - 34.0 pg Final   MCV  Date Value Ref Range Status  04/05/2021 90.0 80.0 - 100.0 fL Final   No results found for: PLTCOUNTKUC, LABPLAT, POCPLA RDW  Date Value Ref Range Status  04/05/2021 12.3 11.5 - 15.5 % Final         Passed - Cr in normal range and within 360 days  Creat  Date Value Ref Range Status  02/10/2021 0.65 0.50 - 1.03 mg/dL Final   Creatinine, Ser  Date Value Ref Range Status  04/05/2021 0.85 0.44 - 1.00 mg/dL Final   Creatinine,U  Date Value Ref Range Status  02/12/2019 75.8 mg/dL Final         Passed - eGFR in normal range and within 360 days    GFR calc Af Amer  Date Value Ref Range Status  09/17/2017 >60 >60 mL/min Final    Comment:    (NOTE) The eGFR has been calculated using the CKD EPI equation. This calculation has not been validated in all clinical situations. eGFR's persistently <60 mL/min signify possible Chronic Kidney Disease.    GFR, Estimated  Date Value Ref Range Status  04/05/2021 >60 >60 mL/min Final    Comment:    (NOTE) Calculated using the CKD-EPI Creatinine Equation (2021)    GFR  Date Value Ref Range Status  07/15/2020 71.39 >60.00 mL/min Final    Comment:    Calculated using the CKD-EPI Creatinine Equation (2021)   eGFR  Date Value Ref Range Status  02/10/2021 101 > OR = 60 mL/min/1.42m Final    Comment:    The eGFR is based on the  CKD-EPI 2021 equation. To calculate  the new eGFR from a previous Creatinine or Cystatin C result, go to https://www.kidney.org/professionals/ kdoqi/gfr%[email or web address]

## 2021-11-14 ENCOUNTER — Encounter: Payer: Self-pay | Admitting: Obstetrics & Gynecology

## 2021-11-18 ENCOUNTER — Encounter: Payer: BC Managed Care – PPO | Admitting: Internal Medicine

## 2021-11-23 ENCOUNTER — Other Ambulatory Visit: Payer: Self-pay | Admitting: Internal Medicine

## 2021-11-23 NOTE — Telephone Encounter (Signed)
Requested medication (s) are due for refill today: yes  Requested medication (s) are on the active medication list: yes  Last refill:  04/15/21 #90 1 refills  Future visit scheduled: yes in 1 month  Notes to clinic:  protocol failed last labs 07/15/20. Do you want to refill Rx?     Requested Prescriptions  Pending Prescriptions Disp Refills   rosuvastatin (CRESTOR) 10 MG tablet [Pharmacy Med Name: ROSUVASTATIN CALCIUM 10 MG TAB] 90 tablet 1    Sig: TAKE 1 TABLET BY MOUTH EVERY DAY     Cardiovascular:  Antilipid - Statins 2 Failed - 11/23/2021  1:53 AM      Failed - Lipid Panel in normal range within the last 12 months    Cholesterol  Date Value Ref Range Status  07/15/2020 326 (H) 0 - 200 mg/dL Final    Comment:    ATP III Classification       Desirable:  < 200 mg/dL               Borderline High:  200 - 239 mg/dL          High:  > = 240 mg/dL   LDL Cholesterol (Calc)  Date Value Ref Range Status  05/24/2018 219 (H) mg/dL (calc) Final    Comment:    LDL-C levels > or = 190 mg/dL may indicate familial  hypercholesterolemia (FH). Clinical assessment and  measurement of blood lipid levels should be  considered for all first degree relatives of  patients with an FH diagnosis.  For questions about testing for familial hypercholesterolemia, please call Insurance risk surveyor at [email or web address]. Duncan Dull, et al. J National Lipid Association  Recommendations for Patient-Centered Management of  Dyslipidemia: Part 1 Journal of Clinical Lipidology  2015;9(2), 129-169. Reference range: <100 . Desirable range <100 mg/dL for primary prevention;   <70 mg/dL for patients with CHD or diabetic patients  with > or = 2 CHD risk factors. [name] LDL-C is now calculated using the Martin-Hopkins  calculation, which is a validated novel method providing  better accuracy than the Friedewald equation in the  estimation of LDL-C.  Cresenciano Genre et al. Annamaria Helling. 1610;960(45): 2061-2068   (http://education.Quest[employer].com/faq/FAQ164)    LDL Cholesterol  Date Value Ref Range Status  05/15/2019 122 (H) 0 - 99 mg/dL Final   Direct LDL  Date Value Ref Range Status  07/15/2020 206.0 mg/dL Final    Comment:    Optimal:  <100 mg/dLNear or Above Optimal:  100-129 mg/dLBorderline High:  130-159 mg/dLHigh:  160-189 mg/dLVery High:  >190 mg/dL   HDL  Date Value Ref Range Status  07/15/2020 47.20 >39.00 mg/dL Final   Triglycerides  Date Value Ref Range Status  07/15/2020 (H) 0.0 - 149.0 mg/dL Final   421.0 Triglyceride is over 400; calculations on Lipids are invalid.    Comment:    Normal:  <150 mg/dLBorderline High:  150 - 199 mg/dL         Passed - Cr in normal range and within 360 days    Creat  Date Value Ref Range Status  02/10/2021 0.65 0.50 - 1.03 mg/dL Final   Creatinine, Ser  Date Value Ref Range Status  04/05/2021 0.85 0.44 - 1.00 mg/dL Final   Creatinine,U  Date Value Ref Range Status  02/12/2019 75.8 mg/dL Final         Passed - Patient is not pregnant      Passed - Valid encounter within last 12 months    Recent  Outpatient Visits           7 months ago LLQ pain   [facility] [place], [place] W, NP   9 months ago Type 2 diabetes mellitus with hyperglycemia, without long-term current use of insulin [facility]   [facility] [place], Coralie Keens, NP   1 year ago Left hip pain   [facility] [place], Coralie Keens, NP       Future Appointments             In 1 month [place], Coralie Keens, NP [facility], [facility]

## 2021-12-27 ENCOUNTER — Encounter: Payer: BC Managed Care – PPO | Admitting: Internal Medicine

## 2022-01-13 ENCOUNTER — Encounter: Payer: Self-pay | Admitting: Internal Medicine

## 2022-01-13 ENCOUNTER — Ambulatory Visit (INDEPENDENT_AMBULATORY_CARE_PROVIDER_SITE_OTHER): Payer: 59 | Admitting: Internal Medicine

## 2022-01-13 VITALS — BP 112/78 | HR 93 | Temp 97.5°F | Ht 66.0 in | Wt 173.0 lb

## 2022-01-13 DIAGNOSIS — Z1211 Encounter for screening for malignant neoplasm of colon: Secondary | ICD-10-CM | POA: Diagnosis not present

## 2022-01-13 DIAGNOSIS — E663 Overweight: Secondary | ICD-10-CM

## 2022-01-13 DIAGNOSIS — Z780 Asymptomatic menopausal state: Secondary | ICD-10-CM

## 2022-01-13 DIAGNOSIS — E039 Hypothyroidism, unspecified: Secondary | ICD-10-CM | POA: Diagnosis not present

## 2022-01-13 DIAGNOSIS — Z0001 Encounter for general adult medical examination with abnormal findings: Secondary | ICD-10-CM | POA: Diagnosis not present

## 2022-01-13 DIAGNOSIS — E1165 Type 2 diabetes mellitus with hyperglycemia: Secondary | ICD-10-CM

## 2022-01-13 DIAGNOSIS — I700 Atherosclerosis of aorta: Secondary | ICD-10-CM

## 2022-01-13 DIAGNOSIS — Z6827 Body mass index (BMI) 27.0-27.9, adult: Secondary | ICD-10-CM

## 2022-01-13 DIAGNOSIS — Z1231 Encounter for screening mammogram for malignant neoplasm of breast: Secondary | ICD-10-CM

## 2022-01-13 MED ORDER — HYDROXYZINE PAMOATE 25 MG PO CAPS: 25.0000 mg | ORAL_CAPSULE | Freq: Every day | ORAL | 0 refills | Status: DC | PRN

## 2022-01-13 NOTE — Assessment & Plan Note (Signed)
Encourage diet and exercise for weight loss 

## 2022-01-13 NOTE — Patient Instructions (Signed)
Health Maintenance for Postmenopausal Women Menopause is a normal process in which your ability to get pregnant comes to an end. This process happens slowly over many months or years, usually between the ages of 48 and 55. Menopause is complete when you have missed your menstrual period for 12 months. It is important to talk with your health care provider about some of the most common conditions that affect women after menopause (postmenopausal women). These include heart disease, cancer, and bone loss (osteoporosis). Adopting a healthy lifestyle and getting preventive care can help to promote your health and wellness. The actions you take can also lower your chances of developing some of these common conditions. What are the signs and symptoms of menopause? During menopause, you may have the following symptoms: Hot flashes. These can be moderate or severe. Night sweats. Decrease in sex drive. Mood swings. Headaches. Tiredness (fatigue). Irritability. Memory problems. Problems falling asleep or staying asleep. Talk with your health care provider about treatment options for your symptoms. Do I need hormone replacement therapy? Hormone replacement therapy is effective in treating symptoms that are caused by menopause, such as hot flashes and night sweats. Hormone replacement carries certain risks, especially as you become older. If you are thinking about using estrogen or estrogen with progestin, discuss the benefits and risks with your health care provider. How can I reduce my risk for heart disease and stroke? The risk of heart disease, heart attack, and stroke increases as you age. One of the causes may be a change in the body's hormones during menopause. This can affect how your body uses dietary fats, triglycerides, and cholesterol. Heart attack and stroke are medical emergencies. There are many things that you can do to help prevent heart disease and stroke. Watch your blood pressure High  blood pressure causes heart disease and increases the risk of stroke. This is more likely to develop in people who have high blood pressure readings or are overweight. Have your blood pressure checked: Every 3-5 years if you are 18-39 years of age. Every year if you are 40 years old or older. Eat a healthy diet  Eat a diet that includes plenty of vegetables, fruits, low-fat dairy products, and lean protein. Do not eat a lot of foods that are high in solid fats, added sugars, or sodium. Get regular exercise Get regular exercise. This is one of the most important things you can do for your health. Most adults should: Try to exercise for at least 150 minutes each week. The exercise should increase your heart rate and make you sweat (moderate-intensity exercise). Try to do strengthening exercises at least twice each week. Do these in addition to the moderate-intensity exercise. Spend less time sitting. Even light physical activity can be beneficial. Other tips Work with your health care provider to achieve or maintain a healthy weight. Do not use any products that contain nicotine or tobacco. These products include cigarettes, chewing tobacco, and vaping devices, such as e-cigarettes. If you need help quitting, ask your health care provider. Know your numbers. Ask your health care provider to check your cholesterol and your blood sugar (glucose). Continue to have your blood tested as directed by your health care provider. Do I need screening for cancer? Depending on your health history and family history, you may need to have cancer screenings at different stages of your life. This may include screening for: Breast cancer. Cervical cancer. Lung cancer. Colorectal cancer. What is my risk for osteoporosis? After menopause, you may be   at increased risk for osteoporosis. Osteoporosis is a condition in which bone destruction happens more quickly than new bone creation. To help prevent osteoporosis or  the bone fractures that can happen because of osteoporosis, you may take the following actions: If you are 19-50 years old, get at least 1,000 mg of calcium and at least 600 international units (IU) of vitamin D per day. If you are older than age 50 but younger than age 70, get at least 1,200 mg of calcium and at least 600 international units (IU) of vitamin D per day. If you are older than age 70, get at least 1,200 mg of calcium and at least 800 international units (IU) of vitamin D per day. Smoking and drinking excessive alcohol increase the risk of osteoporosis. Eat foods that are rich in calcium and vitamin D, and do weight-bearing exercises several times each week as directed by your health care provider. How does menopause affect my mental health? Depression may occur at any age, but it is more common as you become older. Common symptoms of depression include: Feeling depressed. Changes in sleep patterns. Changes in appetite or eating patterns. Feeling an overall lack of motivation or enjoyment of activities that you previously enjoyed. Frequent crying spells. Talk with your health care provider if you think that you are experiencing any of these symptoms. General instructions See your health care provider for regular wellness exams and vaccines. This may include: Scheduling regular health, dental, and eye exams. Getting and maintaining your vaccines. These include: Influenza vaccine. Get this vaccine each year before the flu season begins. Pneumonia vaccine. Shingles vaccine. Tetanus, diphtheria, and pertussis (Tdap) booster vaccine. Your health care provider may also recommend other immunizations. Tell your health care provider if you have ever been abused or do not feel safe at home. Summary Menopause is a normal process in which your ability to get pregnant comes to an end. This condition causes hot flashes, night sweats, decreased interest in sex, mood swings, headaches, or lack  of sleep. Treatment for this condition may include hormone replacement therapy. Take actions to keep yourself healthy, including exercising regularly, eating a healthy diet, watching your weight, and checking your blood pressure and blood sugar levels. Get screened for cancer and depression. Make sure that you are up to date with all your vaccines. This information is not intended to replace advice given to you by your health care provider. Make sure you discuss any questions you have with your health care provider. Document Revised: 10/18/2020 Document Reviewed: 10/18/2020 Elsevier Patient Education  2023 Elsevier Inc.  

## 2022-01-13 NOTE — Progress Notes (Signed)
Subjective:    Patient ID: Mary Schaefer, female    DOB: 02-10-1961, 61 y.o.   MRN: [id number]  HPI  Patient presents to clinic today for her annual exam.  Flu: 04/2021 Tetanus: 01/2014 COVID: Pfizer x3 Pneumovax: 02/2019 Shingrix: Never Pap smear: 06/2018 Mammogram: 03/2021 Bone density: Never Colon screening: 10 years ago Vision screening: annually Dentist: biannually  Diet: She does eat meat. She consumes fruits and veggies. She does eat some fried foods. She drinks mostly water. Exercise: Walking   Review of Systems  Past Medical History:  Diagnosis Date   Blood in stool    Childhood asthma    Diabetes mellitus without complication ([place])    Endometriosis 1997   Hypertension    Thyroid disease     Current Outpatient Medications  Medication Sig Dispense Refill   ACCU-CHEK GUIDE test strip USE AS INSTRUCTED 100 strip 12   acetaminophen (TYLENOL) 500 MG tablet Take 1,000 mg by mouth as needed for mild pain.     Ascorbic Acid (VITAMIN C PO) Take 1 tablet by mouth daily.     busPIRone (BUSPAR) 7.5 MG tablet TAKE 1 TABLET BY MOUTH TWICE A DAY (Patient taking differently: Take 7.5 mg by mouth daily.) 180 tablet 0   Calcium Carbonate-Vitamin D3 600-400 MG-UNIT TABS Take by mouth.     cyanocobalamin 2000 MCG tablet Take 2,000 mcg by mouth 2 (two) times daily.     gabapentin (NEURONTIN) 100 MG capsule TAKE 1 CAPSULE BY MOUTH EVERYDAY AT BEDTIME 90 capsule 1   glucose blood test strip Check sugar BID as needed. DX E11.9 100 each 6   levothyroxine (SYNTHROID) 112 MCG tablet TAKE 1 TABLET BY MOUTH EVERY DAY(NEED APPT FOR LAB) 90 tablet 2   lisinopril-hydrochlorothiazide (ZESTORETIC) 10-12.5 MG tablet TAKE 1 TABLET BY MOUTH EVERY DAY 90 tablet 2   metFORMIN (GLUCOPHAGE-XR) 500 MG 24 hr tablet TAKE 2 TABLETS (1,000 MG TOTAL) BY MOUTH IN THE MORNING AND AT BEDTIME. 360 tablet 1   omeprazole (PRILOSEC) 10 MG capsule Take 10 mg by mouth daily.     prednisoLONE acetate (PRED FORTE)  1 % ophthalmic suspension INSTILL 1 DROP INTO LEFT EYE 4 TIMES A DAY     rosuvastatin (CRESTOR) 10 MG tablet TAKE 1 TABLET BY MOUTH EVERY DAY 90 tablet 1   No current facility-administered medications for this visit.    Allergies  Allergen Reactions   Aspirin Shortness Of Breath and Hives   Penicillins Hives    Family History  Problem Relation Age of Onset   Ovarian cancer Mother    Hyperlipidemia Mother    Alcohol abuse Father    Hyperlipidemia Father    Heart disease Father    Stroke Father    Hypertension Father    Diabetes Sister    Colon cancer Neg Hx    Esophageal cancer Neg Hx     Social History   Socioeconomic History   Marital status: Married    Spouse name: Not on file   Number of children: Not on file   Years of education: Not on file   Highest education level: Not on file  Occupational History   Not on file  Tobacco Use   Smoking status: Never   Smokeless tobacco: Never  Vaping Use   Vaping Use: Never used  Substance and Sexual Activity   Alcohol use: Yes    Alcohol/week: 1.0 - 2.0 standard drink of alcohol    Types: 1 - 2 Glasses of wine  per week    Comment: rare   Drug use: Not Currently   Sexual activity: Yes  Other Topics Concern   Not on file  Social History Narrative   ** Merged History Encounter **       Social Determinants of Health   Financial Resource Strain: Not on file  Food Insecurity: Not on file  Transportation Needs: Not on file  Physical Activity: Not on file  Stress: Not on file  Social Connections: Not on file  Intimate Partner Violence: Not on file     Constitutional: Denies fever, malaise, fatigue, headache or abrupt weight changes.  HEENT: Denies eye pain, eye redness, ear pain, ringing in the ears, wax buildup, runny nose, nasal congestion, bloody nose, or sore throat. Respiratory: Denies difficulty breathing, shortness of breath, cough or sputum production.   Cardiovascular: Denies chest pain, chest tightness,  palpitations or swelling in the hands or feet.  Gastrointestinal: Patient reports intermittent reflux.  Denies abdominal pain, bloating, constipation, diarrhea or blood in the stool.  GU: Denies urgency, frequency, pain with urination, burning sensation, blood in urine, odor or discharge. Musculoskeletal: Denies decrease in range of motion, difficulty with gait, muscle pain or joint pain and swelling.  Skin: Denies redness, rashes, lesions or ulcercations.  Neurological: Patient reports neuropathic pain.  Denies dizziness, difficulty with memory, difficulty with speech or problems with balance and coordination.  Psych: Patient has a history of anxiety.  Denies depression, SI/HI.  No other specific complaints in a complete review of systems (except as listed in HPI above).     Objective:   Physical Exam BP 112/78 (BP Location: Left Arm, Patient Position: Sitting, Cuff Size: Normal)   Pulse 93   Temp (!) 97.5 F (36.4 C) (Temporal)   Ht _0  (1.676 m)   Wt 173 lb (78.5 kg)   LMP 09/10/2016   SpO2 99%   BMI 27.92 kg/m   Wt Readings from Last 3 Encounters:  04/14/21 171 lb (77.6 kg)  04/06/21 173 lb (78.5 kg)  04/05/21 172 lb (78 kg)    General: Appears her stated age, overweight, in NAD. Skin: Warm, dry and intact. No ulcerations noted. HEENT: Head: normal shape and size; Eyes: sclera white, no icterus, conjunctiva pink, PERRLA and EOMs intact;  Neck:  Neck supple, trachea midline. No masses, lumps or thyromegaly present.  Cardiovascular: Normal rate and rhythm. S1,S2 noted.  No murmur, rubs or gallops noted. No JVD or BLE edema. No carotid bruits noted. Pulmonary/Chest: Normal effort and positive vesicular breath sounds. No respiratory distress. No wheezes, rales or ronchi noted.  Abdomen: Soft and nontender. Normal bowel sounds. Musculoskeletal: Strength 5/5 BUE/BLE.  No difficulty with gait.  Neurological: Alert and oriented. Cranial nerves II-XII grossly intact. Coordination  normal.  Psychiatric: Mood and affect normal. Behavior is normal. Judgment and thought content normal.     BMET    Component Value Date/Time   NA 137 04/05/2021 1500   K 3.8 04/05/2021 1500   CL 100 04/05/2021 1500   CO2 27 04/05/2021 1500   GLUCOSE 115 (H) 04/05/2021 1500   BUN 14 04/05/2021 1500   CREATININE 0.85 04/05/2021 1500   CREATININE 0.65 02/10/2021 0925   CALCIUM 9.7 04/05/2021 1500   GFRNONAA >60 04/05/2021 1500   GFRAA >60 09/17/2017 0732    Lipid Panel     Component Value Date/Time   CHOL 326 (H) 07/15/2020 1614   TRIG (H) 07/15/2020 1614    421.0 Triglyceride is over 400; calculations  on Lipids are invalid.   HDL 47.20 07/15/2020 1614   CHOLHDL 7 07/15/2020 1614   VLDL 43.2 (H) 09/02/2019 0809   LDLCALC 122 (H) 05/15/2019 0905   LDLCALC 219 (H) 05/24/2018 1609    CBC    Component Value Date/Time   WBC 6.1 04/05/2021 1500   RBC 5.09 04/05/2021 1500   HGB 15.8 (H) 04/05/2021 1500   HCT 45.8 04/05/2021 1500   PLT 284 04/05/2021 1500   MCV 90.0 04/05/2021 1500   MCH 31.0 04/05/2021 1500   MCHC 34.5 04/05/2021 1500   RDW 12.3 04/05/2021 1500    Hgb A1C Lab Results  Component Value Date   HGBA1C 9.9 (H) 02/10/2021            Assessment & Plan:   Preventative Health Maintenance:  Encouraged her to get a flu shot in the fall Tetanus UTD Encouraged her to get her COVID booster Pneumovax UTD Discussed Shingrix vaccine, she will check coverage with her insurance company and schedule a nurse visit if she would like to have this done Pap smear UTD Mammogram and bone density ordered-she will call to schedule Referral to GI for screening colonoscopy Encouraged her to consume a balanced diet and exercise regimen Advised her to see an eye doctor and dentist annually We will check CBC, c-Met, TSH, free T4, lipid, A1c, urine microalbumin today  RTC in 3 months, follow-up chronic conditions Webb Silversmith, NP

## 2022-01-13 NOTE — Assessment & Plan Note (Signed)
C-Met and lipid profile today

## 2022-01-14 LAB — CBC
HCT: 43.8 % (ref 35.0–45.0)
Hemoglobin: 14.8 g/dL (ref 11.7–15.5)
MCH: 30.6 pg (ref 27.0–33.0)
MCHC: 33.8 g/dL (ref 32.0–36.0)
MCV: 90.5 fL (ref 80.0–100.0)
MPV: 10.2 fL (ref 7.5–12.5)
Platelets: 314 10*3/uL (ref 140–400)
RBC: 4.84 10*6/uL (ref 3.80–5.10)
RDW: 12.7 % (ref 11.0–15.0)
WBC: 7.6 10*3/uL (ref 3.8–10.8)

## 2022-01-14 LAB — MICROALBUMIN / CREATININE URINE RATIO
Creatinine, Urine: 115 mg/dL (ref 20–275)
Microalb Creat Ratio: 10 mcg/mg creat (ref <30)
Microalb, Ur: 1.1 mg/dL

## 2022-01-14 LAB — LIPID PANEL
Cholesterol: 157 mg/dL (ref <200)
HDL: 44 mg/dL — ABNORMAL LOW (ref >=50)
LDL Cholesterol (Calc): 83 mg/dL (calc)
Non-HDL Cholesterol (Calc): 113 mg/dL (calc) (ref <130)
Total CHOL/HDL Ratio: 3.6 (calc) (ref <5.0)
Triglycerides: 205 mg/dL — ABNORMAL HIGH (ref <150)

## 2022-01-14 LAB — HEMOGLOBIN A1C
Hgb A1c MFr Bld: 7.5 % of total Hgb — ABNORMAL HIGH (ref <5.7)
Mean Plasma Glucose: 169 mg/dL
eAG (mmol/L): 9.3 mmol/L

## 2022-01-14 LAB — TSH: TSH: 3.2 mIU/L (ref 0.40–4.50)

## 2022-01-14 LAB — COMPLETE METABOLIC PANEL WITH GFR
AG Ratio: 1.9 (calc) (ref 1.0–2.5)
ALT: 62 U/L — ABNORMAL HIGH (ref 6–29)
AST: 37 U/L — ABNORMAL HIGH (ref 10–35)
Albumin: 4.7 g/dL (ref 3.6–5.1)
Alkaline phosphatase (APISO): 71 U/L (ref 37–153)
BUN: 14 mg/dL (ref 7–25)
CO2: 27 mmol/L (ref 20–32)
Calcium: 10.2 mg/dL (ref 8.6–10.4)
Chloride: 97 mmol/L — ABNORMAL LOW (ref 98–110)
Creat: 0.59 mg/dL (ref 0.50–1.05)
Globulin: 2.5 g/dL (calc) (ref 1.9–3.7)
Glucose, Bld: 141 mg/dL — ABNORMAL HIGH (ref 65–139)
Potassium: 3.8 mmol/L (ref 3.5–5.3)
Sodium: 136 mmol/L (ref 135–146)
Total Bilirubin: 0.5 mg/dL (ref 0.2–1.2)
Total Protein: 7.2 g/dL (ref 6.1–8.1)
eGFR: 103 mL/min/{1.73_m2} (ref >=60)

## 2022-01-14 LAB — T4, FREE: Free T4: 1.7 ng/dL (ref 0.8–1.8)

## 2022-01-16 ENCOUNTER — Encounter: Payer: Self-pay | Admitting: Internal Medicine

## 2022-01-16 ENCOUNTER — Telehealth: Payer: Self-pay

## 2022-01-16 ENCOUNTER — Other Ambulatory Visit: Payer: Self-pay

## 2022-01-16 DIAGNOSIS — Z1211 Encounter for screening for malignant neoplasm of colon: Secondary | ICD-10-CM

## 2022-01-16 MED ORDER — EZETIMIBE 10 MG PO TABS: 10.0000 mg | ORAL_TABLET | Freq: Every day | ORAL | 1 refills | Status: DC

## 2022-01-16 NOTE — Telephone Encounter (Signed)
Gastroenterology Pre-Procedure Review  Request Date: 05/15/22 Requesting Physician: Dr. Vicente Males  PATIENT REVIEW QUESTIONS: The patient responded to the following health history questions as indicated:    1. Are you having any GI issues? no 2. Do you have a personal history of Polyps? no 3. Do you have a family history of Colon Cancer or Polyps? no 4. Diabetes Mellitus? yes (type 2 patient has been advised to hold Metformin 2 days prior to colonoscopy per new guidelines) 5. Joint replacements in the past 12 months?no 6. Major health problems in the past 3 months?no 7. Any artificial heart valves, MVP, or defibrillator?no    MEDICATIONS & ALLERGIES:    Patient reports the following regarding taking any anticoagulation/antiplatelet therapy:   Plavix, Coumadin, Eliquis, Xarelto, Lovenox, Pradaxa, Brilinta, or Effient? no Aspirin? no  Patient confirms/reports the following medications:  Current Outpatient Medications  Medication Sig Dispense Refill   ACCU-CHEK GUIDE test strip USE AS INSTRUCTED 100 strip 12   acetaminophen (TYLENOL) 500 MG tablet Take 1,000 mg by mouth as needed for mild pain.     Ascorbic Acid (VITAMIN C PO) Take 1 tablet by mouth daily.     busPIRone (BUSPAR) 7.5 MG tablet TAKE 1 TABLET BY MOUTH TWICE A DAY (Patient not taking: Reported on 01/13/2022) 180 tablet 0   Calcium Carbonate-Vitamin D3 600-400 MG-UNIT TABS Take by mouth.     cyanocobalamin 2000 MCG tablet Take 2,000 mcg by mouth 2 (two) times daily.     ezetimibe (ZETIA) 10 MG tablet Take 1 tablet (10 mg total) by mouth daily. 90 tablet 1   gabapentin (NEURONTIN) 100 MG capsule TAKE 1 CAPSULE BY MOUTH EVERYDAY AT BEDTIME 90 capsule 1   glucose blood test strip Check sugar BID as needed. DX E11.9 100 each 6   hydrOXYzine (VISTARIL) 25 MG capsule Take 1 capsule (25 mg total) by mouth daily as needed. 90 capsule 0   levothyroxine (SYNTHROID) 112 MCG tablet TAKE 1 TABLET BY MOUTH EVERY DAY(NEED APPT FOR LAB) 90 tablet 2    lisinopril-hydrochlorothiazide (ZESTORETIC) 10-12.5 MG tablet TAKE 1 TABLET BY MOUTH EVERY DAY 90 tablet 2   metFORMIN (GLUCOPHAGE-XR) 500 MG 24 hr tablet TAKE 2 TABLETS (1,000 MG TOTAL) BY MOUTH IN THE MORNING AND AT BEDTIME. 360 tablet 1   omeprazole (PRILOSEC) 10 MG capsule Take 10 mg by mouth daily.     prednisoLONE acetate (PRED FORTE) 1 % ophthalmic suspension INSTILL 1 DROP INTO LEFT EYE 4 TIMES A DAY     rosuvastatin (CRESTOR) 10 MG tablet TAKE 1 TABLET BY MOUTH EVERY DAY 90 tablet 1   No current facility-administered medications for this visit.    Patient confirms/reports the following allergies:  Allergies  Allergen Reactions   Aspirin Shortness Of Breath and Hives   Penicillins Hives    No orders of the defined types were placed in this encounter.   AUTHORIZATION INFORMATION Primary Insurance: 1D#: Group #:  Secondary Insurance: 1D#: Group #:  SCHEDULE INFORMATION: Date: 05/15/22 Time: Location: [facility]

## 2022-01-16 NOTE — Telephone Encounter (Signed)
Left message for pt to call office back regarding message left w/ answering service.

## 2022-02-21 ENCOUNTER — Other Ambulatory Visit: Payer: Self-pay | Admitting: Internal Medicine

## 2022-02-21 DIAGNOSIS — E039 Hypothyroidism, unspecified: Secondary | ICD-10-CM

## 2022-02-21 DIAGNOSIS — I10 Essential (primary) hypertension: Secondary | ICD-10-CM

## 2022-02-22 NOTE — Telephone Encounter (Signed)
Requested Prescriptions  Pending Prescriptions Disp Refills  . levothyroxine (SYNTHROID) 112 MCG tablet [Pharmacy Med Name: LEVOTHYROXINE 112 MCG TABLET] 90 tablet 2    Sig: TAKE 1 TABLET BY MOUTH EVERY DAY(NEED APPT FOR LAB)     Endocrinology:  Hypothyroid Agents Passed - 02/21/2022  2:25 AM      Passed - TSH in normal range and within 360 days    TSH  Date Value Ref Range Status  01/13/2022 3.20 0.40 - 4.50 mIU/L Final         Passed - Valid encounter within last 12 months    Recent Outpatient Visits          1 month ago Encounter for general adult medical examination with abnormal findings   [facility] [place], Coralie Keens, NP   10 months ago LLQ pain   [facility] [place], Coralie Keens, NP   1 year ago Type 2 diabetes mellitus with hyperglycemia, without long-term current use of insulin ([place])   [facility] [place], Coralie Keens, NP   1 year ago Left hip pain   [facility] [place], [place] W, NP             . lisinopril-hydrochlorothiazide (ZESTORETIC) 10-12.5 MG tablet [Pharmacy Med Name: LISINOPRIL-HCTZ 10-12.5 MG TAB] 90 tablet 1    Sig: TAKE 1 TABLET BY MOUTH EVERY DAY     Cardiovascular:  ACEI + Diuretic Combos Passed - 02/21/2022  2:25 AM      Passed - Na in normal range and within 180 days    Sodium  Date Value Ref Range Status  01/13/2022 136 135 - 146 mmol/L Final         Passed - K in normal range and within 180 days    Potassium  Date Value Ref Range Status  01/13/2022 3.8 3.5 - 5.3 mmol/L Final         Passed - Cr in normal range and within 180 days    Creat  Date Value Ref Range Status  01/13/2022 0.59 0.50 - 1.05 mg/dL Final   Creatinine,U  Date Value Ref Range Status  02/12/2019 75.8 mg/dL Final   Creatinine, Urine  Date Value Ref Range Status  01/13/2022 115 20 - 275 mg/dL Final         Passed - eGFR is 30 or above and within 180 days    GFR calc Af Amer  Date Value Ref Range Status   09/17/2017 >60 >60 mL/min Final    Comment:    (NOTE) The eGFR has been calculated using the CKD EPI equation. This calculation has not been validated in all clinical situations. eGFR's persistently <60 mL/min signify possible Chronic Kidney Disease.    GFR, Estimated  Date Value Ref Range Status  04/05/2021 >60 >60 mL/min Final    Comment:    (NOTE) Calculated using the CKD-EPI Creatinine Equation (2021)    GFR  Date Value Ref Range Status  07/15/2020 71.39 >60.00 mL/min Final    Comment:    Calculated using the CKD-EPI Creatinine Equation (2021)   eGFR  Date Value Ref Range Status  01/13/2022 103 > OR = 60 mL/min/1.63m Final         Passed - Patient is not pregnant      Passed - Last BP in normal range    BP Readings from Last 1 Encounters:  01/13/22 112/78         Passed - Valid encounter within  last 6 months    Recent Outpatient Visits          1 month ago Encounter for general adult medical examination with abnormal findings   [facility] [place], Coralie Keens, NP   10 months ago LLQ pain   [facility] [place], [place] W, NP   1 year ago Type 2 diabetes mellitus with hyperglycemia, without long-term current use of insulin [facility])   [facility] [place], Coralie Keens, NP   1 year ago Left hip pain   [facility] [place], Coralie Keens, [place]

## 2022-02-25 ENCOUNTER — Other Ambulatory Visit: Payer: Self-pay | Admitting: Internal Medicine

## 2022-02-27 NOTE — Telephone Encounter (Signed)
Requested Prescriptions  Pending Prescriptions Disp Refills  . gabapentin (NEURONTIN) 100 MG capsule [Pharmacy Med Name: GABAPENTIN 100 MG CAPSULE] 90 capsule 1    Sig: TAKE 1 CAPSULE BY MOUTH EVERYDAY AT BEDTIME     Neurology: Anticonvulsants - gabapentin Failed - 02/25/2022 10:36 AM      Failed - Completed PHQ-2 or PHQ-9 in the last 360 days      Passed - Cr in normal range and within 360 days    Creat  Date Value Ref Range Status  01/13/2022 0.59 0.50 - 1.05 mg/dL Final   Creatinine,U  Date Value Ref Range Status  02/12/2019 75.8 mg/dL Final   Creatinine, Urine  Date Value Ref Range Status  01/13/2022 115 20 - 275 mg/dL Final         Passed - Valid encounter within last 12 months    Recent Outpatient Visits          1 month ago Encounter for general adult medical examination with abnormal findings   [facility] [place], Coralie Keens, NP   10 months ago LLQ pain   [facility] [place], [place] W, NP   1 year ago Type 2 diabetes mellitus with hyperglycemia, without long-term current use of insulin [facility])   [facility] [place], Coralie Keens, NP   1 year ago Left hip pain   [facility] [place], Coralie Keens, [place]

## 2022-03-06 ENCOUNTER — Ambulatory Visit
Admission: RE | Admit: 2022-03-06 | Discharge: 2022-03-06 | Disposition: A | Discharge status: Home / Self Care | Payer: 59 | Source: Ambulatory Visit | Attending: Urgent Care | Admitting: Urgent Care

## 2022-03-06 VITALS — BP 120/85 | HR 113 | Temp 98.9°F | Resp 18

## 2022-03-06 DIAGNOSIS — R6889 Other general symptoms and signs: Secondary | ICD-10-CM | POA: Insufficient documentation

## 2022-03-06 DIAGNOSIS — Z20822 Contact with and (suspected) exposure to covid-19: Secondary | ICD-10-CM | POA: Insufficient documentation

## 2022-03-06 DIAGNOSIS — J069 Acute upper respiratory infection, unspecified: Secondary | ICD-10-CM | POA: Insufficient documentation

## 2022-03-06 LAB — RESP PANEL BY RT-PCR (RSV, FLU A&B, COVID)  RVPGX2
Influenza A by PCR: NEGATIVE
Influenza B by PCR: NEGATIVE
Resp Syncytial Virus by PCR: NEGATIVE
SARS Coronavirus 2 by RT PCR: NEGATIVE

## 2022-03-06 NOTE — Discharge Instructions (Addendum)
Follow-up here or with your primary care provider if your symptoms worsen or do not resolve within 1 week.

## 2022-03-06 NOTE — ED Provider Notes (Signed)
Roderic Palau    CSN: [id number] Arrival date & time: 03/06/22  1238      History   Chief Complaint Chief Complaint  Patient presents with   Cough    Upset stomach, congestion, fever. - Entered by patient   Diarrhea   Nasal Congestion   Fatigue   Appointment    HPI Mary Schaefer is a 61 y.o. female.    Cough Diarrhea   Presents to urgent care with report of flulike symptoms now x10 days.  States symptoms started with diarrhea which then resolved and has returned in the past few days.  Now with cough, nasal congestion, shortness of breath.  She states she tested herself for COVID twice in the last 2 days, both negative.  She denies abdominal pain.  Denies dysuria.  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   HLD (hyperlipidemia) 09/06/2017   Essential hypertension 03/31/2016   Acquired hypothyroidism 03/31/2016   Endometriosis 03/31/2016   Generalized anxiety disorder 03/31/2016   Anal fissure 03/17/2016   Family history of malignant neoplasm of ovary in first degree relative 08/13/2014   Abnormal perimenopausal bleeding 03/07/2013    Past Surgical History:  Procedure Laterality Date   CORNEAL TRANSPLANT     TUBAL LIGATION  1998    OB History     Gravida  4   Para  3   Term  3   Preterm  0   AB  1   Living  3      SAB  1   IAB  0   Ectopic  0   Multiple      Live Births  3            Home Medications    Prior to Admission  medications   Medication Sig Start Date End Date Taking? Authorizing Provider  prednisoLONE acetate (PRED FORTE) 1 % ophthalmic suspension Apply to eye. 06/03/21  Yes [provider]  ACCU-CHEK GUIDE test strip USE AS INSTRUCTED 06/15/21   Jearld Fenton, NP  acetaminophen (TYLENOL) 500 MG tablet Take 1,000 mg by mouth as needed for mild pain.    [provider]  Ascorbic Acid (VITAMIN C PO) Take 1 tablet by mouth daily.    [provider]  busPIRone (BUSPAR) 7.5 MG tablet TAKE 1 TABLET BY MOUTH TWICE A DAY Patient not taking: Reported on 01/13/2022 09/30/20   Jearld Fenton, NP  Calcium Carbonate-Vitamin D3 600-400 MG-UNIT TABS Take by mouth.    [provider]  cyanocobalamin 2000 MCG tablet Take 2,000 mcg by mouth 2 (two) times daily.    [provider]  cyclobenzaprine (FLEXERIL) 5 MG tablet Take 5 mg by mouth 3 (three) times daily as needed. 01/24/22   [provider]  ezetimibe (ZETIA) 10 MG tablet Take 1 tablet (10 mg total) by mouth daily. 01/16/22   Jearld Fenton, NP  Ages TEST KIT REFER MANUFACTURER  INSTRUCTIONS PER PACKAGING 10/01/21   [provider]  gabapentin (NEURONTIN) 100 MG capsule TAKE 1 CAPSULE BY MOUTH EVERYDAY AT BEDTIME 02/27/22   Baity, Coralie Keens, NP  glucose blood test strip Check sugar BID as needed. DX E11.9 02/21/19   Jearld Fenton, NP  hydrOXYzine (VISTARIL) 25 MG capsule Take 1 capsule (25 mg total) by mouth daily as needed. 01/13/22   Jearld Fenton, NP  levothyroxine (SYNTHROID) 112 MCG tablet TAKE 1 TABLET BY MOUTH EVERY DAY(NEED APPT FOR LAB) 02/22/22   Jearld Fenton, NP  lisinopril-hydrochlorothiazide (ZESTORETIC) 10-12.5 MG tablet TAKE 1 TABLET BY MOUTH EVERY DAY 02/22/22   Jearld Fenton, NP  meloxicam (MOBIC) 15 MG tablet Take 15 mg by mouth daily. 09/25/21   [provider]  metFORMIN (GLUCOPHAGE-XR) 500 MG 24 hr tablet TAKE 2 TABLETS (1,000 MG TOTAL) BY MOUTH IN THE  MORNING AND AT BEDTIME. 11/01/21   Baity, Coralie Keens, NP  methylPREDNISolone (MEDROL DOSEPAK) 4 MG TBPK tablet Take by mouth as directed. 01/24/22   [provider]  omeprazole (PRILOSEC) 10 MG capsule Take 10 mg by mouth daily.    [provider]  prednisoLONE acetate (PRED FORTE) 1 % ophthalmic suspension INSTILL 1 DROP INTO LEFT EYE 4 TIMES A DAY 10/02/18   [provider]  rosuvastatin (CRESTOR) 10 MG tablet TAKE 1 TABLET BY MOUTH EVERY DAY 04/15/21   Baity, Coralie Keens, NP    Family History Family History  Problem Relation Age of Onset   Ovarian cancer Mother    Hyperlipidemia Mother    Alcohol abuse Father    Hyperlipidemia Father    Heart disease Father    Stroke Father    Hypertension Father    Diabetes Sister    Colon cancer Neg Hx    Esophageal cancer Neg Hx     Social History Social History   Tobacco Use   Smoking status: Never   Smokeless tobacco: Never  Vaping Use   Vaping Use: Never used  Substance Use Topics   Alcohol use: Yes    Alcohol/week: 1.0 - 2.0 standard drink of alcohol    Types: 1 - 2 Glasses of wine per week    Comment: rare   Drug use: Not Currently     Allergies   Aspirin and Penicillins   Review of Systems Review of Systems  Respiratory:  Positive for cough.   Gastrointestinal:  Positive for diarrhea.     Physical Exam Triage Vital Signs ED Triage Vitals  Enc Vitals Group     BP 03/06/22 1259 120/85     Pulse Rate 03/06/22 1259 (!) 113     Resp 03/06/22 1259 18     Temp 03/06/22 1259 98.9 F (37.2 C)     Temp Source 03/06/22 1259 Oral     SpO2 03/06/22 1259 96 %     Weight --      Height --      Head Circumference --      Peak Flow --      Pain Score 03/06/22 1302 0     Pain Loc --      Pain Edu? --      Excl. in [place]? --    No data found.  Updated Vital Signs BP 120/85 (BP Location: Left Arm)   Pulse (!) 113   Temp 98.9 F (37.2 C) (Oral)   Resp 18   LMP 09/10/2016   SpO2 96%   Visual  Acuity  Right Eye Distance:   Left Eye Distance:   Bilateral Distance:    Right Eye Near:   Left Eye Near:    Bilateral Near:     Physical Exam Vitals reviewed.  Constitutional:      General: She is not in acute distress.    Appearance: Normal appearance. She is ill-appearing.  HENT:     Head: Normocephalic and atraumatic.     Nose: Congestion present.     Mouth/Throat:     Mouth: Mucous membranes are moist.     Pharynx: Posterior oropharyngeal erythema present. No oropharyngeal exudate.  Cardiovascular:     Rate and Rhythm: Regular rhythm. Tachycardia present.  Pulmonary:     Effort: Pulmonary effort is normal.     Breath sounds: Normal breath sounds.  Skin:    General: Skin is warm and dry.  Neurological:     General: No focal deficit present.     Mental Status: She is alert and oriented to person, place, and time.  Psychiatric:        Mood and Affect: Mood normal.        Behavior: Behavior normal.      UC Treatments / Results  Labs (all labs ordered are listed, but only abnormal results are displayed) Labs Reviewed  RESP PANEL BY RT-PCR (RSV, FLU A&B, COVID)  [id number]    EKG   Radiology No results found.  Procedures Procedures (including critical care time)  Medications Ordered in UC Medications - No data to display  Initial Impression / Assessment and Plan / UC Course  I have reviewed the triage vital signs and the nursing notes.  Pertinent labs & imaging results that were available during my care of the patient were reviewed by me and considered in my medical decision making (see chart for details).   Continue to suspect viral etiology.  There are no symptoms where a bacterial infection is apparent.  Will obtain respiratory swab 2-day.  Asking patient to push hydration, use ibuprofen/Tylenol for fever control.   Final Clinical Impressions(s) / UC Diagnoses   Final diagnoses:  Flu-like symptoms   Discharge Instructions   None    ED  Prescriptions   None    PDMP not reviewed this encounter.   Rose Phi, [place] 03/06/22 1340

## 2022-03-06 NOTE — ED Triage Notes (Signed)
Pt. States that since Friday she has been having a cough, nasal congestion and SOB. Pt. Also states she has had diarrhea for over 10 days. Pt. Has tested herself for covid twice in the last two days and they have came back negative.

## 2022-03-16 ENCOUNTER — Ambulatory Visit: Payer: Self-pay | Admitting: Dermatology

## 2022-03-20 ENCOUNTER — Other Ambulatory Visit (HOSPITAL_COMMUNITY)
Admission: RE | Admit: 2022-03-20 | Discharge: 2022-03-20 | Disposition: A | Discharge status: Home / Self Care | Payer: 59 | Source: Ambulatory Visit | Attending: Family Medicine | Admitting: Family Medicine

## 2022-03-20 ENCOUNTER — Ambulatory Visit (INDEPENDENT_AMBULATORY_CARE_PROVIDER_SITE_OTHER): Payer: 59 | Admitting: Family Medicine

## 2022-03-20 ENCOUNTER — Encounter: Payer: Self-pay | Admitting: Family Medicine

## 2022-03-20 VITALS — BP 116/78 | HR 111 | Ht 66.0 in | Wt 175.0 lb

## 2022-03-20 DIAGNOSIS — Z8041 Family history of malignant neoplasm of ovary: Secondary | ICD-10-CM

## 2022-03-20 DIAGNOSIS — Z01419 Encounter for gynecological examination (general) (routine) without abnormal findings: Secondary | ICD-10-CM | POA: Diagnosis present

## 2022-03-20 NOTE — Progress Notes (Signed)
Annual  Mammogram: 03/14/21 pt has appt scheduled.  Colonoscopy: Scheduled 05/2022.  Last pap: > 3 yrs per pt  CC: None   Pt consents to student in exam room.

## 2022-03-20 NOTE — Progress Notes (Signed)
   GYNECOLOGY ANNUAL PREVENTATIVE CARE ENCOUNTER NOTE  Subjective:   Mary Schaefer is a 61 y.o. [phone] female here for a routine annual gynecologic exam.  Current complaints: none.    Has scheduled CRC screening, DEXA and mammogram. No concerns or changes in history.   Denies abnormal vaginal bleeding, discharge, pelvic pain, problems with intercourse or other gynecologic concerns.    Gynecologic History Patient's last menstrual period was 09/10/2016. Contraception: post menopausal status Last Pap: 2019. Results were: normal Last mammogram: 2022. Results were: normal  Health Maintenance Due  Topic Date Due   Zoster Vaccines- Shingrix (1 of 2) Never done   COVID-19 Vaccine (4 - Pfizer risk series) 06/09/2020   OPHTHALMOLOGY EXAM  07/05/2021    The following portions of the patient's history were reviewed and updated as appropriate: allergies, current medications, past family history, past medical history, past social history, past surgical history and problem list.  Review of Systems Pertinent items are noted in HPI.   Objective:  LMP 09/10/2016  CONSTITUTIONAL: Well-developed, well-nourished female in no acute distress.  HENT:  Normocephalic, atraumatic, External right and left ear normal. Oropharynx is clear and moist EYES:  No scleral icterus.  NECK: Normal range of motion, supple, no masses.  Normal thyroid.  SKIN: Skin is warm and dry. No rash noted. Not diaphoretic. No erythema. No pallor. NEUROLOGIC: Alert and oriented to person, place, and time. Normal reflexes, muscle tone coordination. No cranial nerve deficit noted. PSYCHIATRIC: Normal mood and affect. Normal behavior. Normal judgment and thought content. CARDIOVASCULAR: Normal heart rate noted, regular rhythm. 2+ distal pulses. RESPIRATORY: Effort and breath sounds normal, no problems with respiration noted. BREASTS: Symmetric in size. No masses, skin changes, nipple drainage, or lymphadenopathy. ABDOMEN: Soft,   no distention noted.  No tenderness, rebound or guarding.  PELVIC: Normal appearing external genitalia; Mildly atrophic vaginal mucosa and cervix. No abnormal discharge noted.  Pap smear obtained.  Normal uterine size, no other palpable masses, no uterine or adnexal tenderness. Some mild prolapse.  MUSCULOSKELETAL: Normal range of motion.     Assessment and Plan:  1) Annual gynecologic examination with pap smear:  Will follow up results of pap smear and manage accordingly. Routine preventative health maintenance measures emphasized. Reviewed perimenopausal symptoms and management.  - Scheduled for DEXA - Scheduled for mammogram - Colonscopy scheduled  1. Family history of malignant neoplasm of ovary in first degree relative Discussed shared decision making for screening and that blood work is not considered valid screening. Patient without sx of ovarian pathology - bimanual WNL today - CEA - CA 125  2. Well woman exam with routine gynecological exam - Cytology - PAP( [place])    There are no diagnoses linked to this encounter.  Please refer to After Visit Summary for other counseling recommendations.   No follow-ups on file.  Caren Macadam, MD, MPH, ABFM Attending Physician Center for [facility]

## 2022-03-21 LAB — CEA: CEA: 1.1 ng/mL (ref 0.0–4.7)

## 2022-03-21 LAB — CA 125: Cancer Antigen (CA) 125: 15.3 U/mL (ref 0.0–38.1)

## 2022-03-22 LAB — CYTOLOGY - PAP
Comment: NEGATIVE
Diagnosis: NEGATIVE
High risk HPV: NEGATIVE

## 2022-03-28 ENCOUNTER — Ambulatory Visit: Payer: 59 | Admitting: Dermatology

## 2022-03-28 DIAGNOSIS — L821 Other seborrheic keratosis: Secondary | ICD-10-CM | POA: Diagnosis not present

## 2022-03-28 DIAGNOSIS — D492 Neoplasm of unspecified behavior of bone, soft tissue, and skin: Secondary | ICD-10-CM

## 2022-03-28 DIAGNOSIS — D239 Other benign neoplasm of skin, unspecified: Secondary | ICD-10-CM

## 2022-03-28 DIAGNOSIS — Z1283 Encounter for screening for malignant neoplasm of skin: Secondary | ICD-10-CM

## 2022-03-28 DIAGNOSIS — L814 Other melanin hyperpigmentation: Secondary | ICD-10-CM | POA: Diagnosis not present

## 2022-03-28 DIAGNOSIS — D2272 Melanocytic nevi of left lower limb, including hip: Secondary | ICD-10-CM

## 2022-03-28 DIAGNOSIS — L578 Other skin changes due to chronic exposure to nonionizing radiation: Secondary | ICD-10-CM | POA: Diagnosis not present

## 2022-03-28 DIAGNOSIS — D229 Melanocytic nevi, unspecified: Secondary | ICD-10-CM

## 2022-03-28 HISTORY — DX: Other benign neoplasm of skin, unspecified: D23.9

## 2022-03-28 NOTE — Progress Notes (Signed)
Follow-Up Visit   Subjective  Mary Schaefer is a 61 y.o. female who presents for the following: TBSE (No personal hx skin cancer. ).  The patient presents for Total-Body Skin Exam (TBSE) for skin cancer screening and mole check.  The patient has spots, moles and lesions to be evaluated, some may be new or changing and the patient has concerns that these could be cancer.  Family history of skin cancer - what type(s): melanoma, stage I - who affected: sister   The following portions of the chart were reviewed this encounter and updated as appropriate:   Tobacco  Allergies  Meds  Problems  Med Hx  Surg Hx  Fam Hx      Review of Systems:  No other skin or systemic complaints except as noted in HPI or Assessment and Plan.  Objective  Well appearing patient in no apparent distress; mood and affect are within normal limits.  A full examination was performed including scalp, head, eyes, ears, nose, lips, neck, chest, axillae, abdomen, back, buttocks, bilateral upper extremities, bilateral lower extremities, hands, feet, fingers, toes, fingernails, and toenails. All findings within normal limits unless otherwise noted below.  left lateral thigh 0.3 cm dark brown thin papule R/o Atypia         Assessment & Plan  Neoplasm of skin left lateral thigh  Epidermal / dermal shaving  Lesion diameter (cm):  0.3 Informed consent: discussed and consent obtained   Timeout: patient name, date of birth, surgical site, and procedure verified   Anesthesia: the lesion was anesthetized in a standard fashion   Anesthetic:  1% lidocaine w/ epinephrine 1-100,000 local infiltration Instrument used: flexible razor blade   Hemostasis achieved with: aluminum chloride   Outcome: patient tolerated procedure well   Post-procedure details: wound care instructions given   Additional details:  Mupirocin and a bandage applied  Specimen 1 - Surgical pathology Differential Diagnosis: R/o  Atypia  Check Margins: No 0.3 cm dark brown thin papule    Lentigines - Scattered tan macules - Due to sun exposure - Benign-appearing, observe - Recommend daily broad spectrum sunscreen SPF 30+ to sun-exposed areas, reapply every 2 hours as needed. - Call for any changes  Seborrheic Keratoses - Stuck-on, waxy, tan-brown papules and/or plaques  - Benign-appearing - Discussed benign etiology and prognosis. - Observe - Call for any changes  Melanocytic Nevi - Tan-brown and/or pink-flesh-colored symmetric macules and papules - Benign appearing on exam today - Observation - Call clinic for new or changing moles - Recommend daily use of broad spectrum spf 30+ sunscreen to sun-exposed areas.   Hemangiomas - Red papules - Discussed benign nature - Observe - Call for any changes  Actinic Damage - Chronic condition, secondary to cumulative UV/sun exposure - diffuse scaly erythematous macules with underlying dyspigmentation - Recommend daily broad spectrum sunscreen SPF 30+ to sun-exposed areas, reapply every 2 hours as needed.  - Staying in the shade or wearing long sleeves, sun glasses (UVA+UVB protection) and wide brim hats (4-inch brim around the entire circumference of the hat) are also recommended for sun protection.  - Call for new or changing lesions.  Skin cancer screening performed today.  Dermatofibroma - Firm pink/brown papulenodule with dimple sign - Benign appearing - Call for any changes  Return in about 1 year (around 03/29/2023) for TBSE.  Graciella Belton, RMA, am acting as scribe for Forest Gleason, MD .  Documentation: I have reviewed the above documentation for accuracy and completeness, and I  agree with the above.  Forest Gleason, MD

## 2022-03-28 NOTE — Patient Instructions (Addendum)
Wound Care Instructions  Cleanse wound gently with soap and water once a day then pat dry with clean gauze. Apply a thin coat of Petrolatum (petroleum jelly, "Vaseline") over the wound (unless you have an allergy to this). We recommend that you use a new, sterile tube of Vaseline. Do not pick or remove scabs. Do not remove the yellow or white "healing tissue" from the base of the wound.  Cover the wound with fresh, clean, nonstick gauze and secure with paper tape. You may use Band-Aids in place of gauze and tape if the wound is small enough, but would recommend trimming much of the tape off as there is often too much. Sometimes Band-Aids can irritate the skin.  You should call the office for your biopsy report after 1 week if you have not already been contacted.  If you experience any problems, such as abnormal amounts of bleeding, swelling, significant bruising, significant pain, or evidence of infection, please call the office immediately.  FOR ADULT SURGERY PATIENTS: If you need something for pain relief you may take 1 extra strength Tylenol (acetaminophen) AND 2 Ibuprofen (200mg each) together every 4 hours as needed for pain. (do not take these if you are allergic to them or if you have a reason you should not take them.) Typically, you may only need pain medication for 1 to 3 days.   Melanoma ABCDEs  Melanoma is the most dangerous type of skin cancer, and is the leading cause of death from skin disease.  You are more likely to develop melanoma if you: Have light-colored skin, light-colored eyes, or red or blond hair Spend a lot of time in the sun Tan regularly, either outdoors or in a tanning bed Have had blistering sunburns, especially during childhood Have a close family member who has had a melanoma Have atypical moles or large birthmarks  Early detection of melanoma is key since treatment is typically straightforward and cure rates are extremely high if we catch it early.   The  first sign of melanoma is often a change in a mole or a new dark spot.  The ABCDE system is a way of remembering the signs of melanoma.  A for asymmetry:  The two halves do not match. B for border:  The edges of the growth are irregular. C for color:  A mixture of colors are present instead of an even brown color. D for diameter:  Melanomas are usually (but not always) greater than 6mm - the size of a pencil eraser. E for evolution:  The spot keeps changing in size, shape, and color.  Please check your skin once per month between visits. You can use a small mirror in front and a large mirror behind you to keep an eye on the back side or your body.   If you see any new or changing lesions before your next follow-up, please call to schedule a visit.  Please continue daily skin protection including broad spectrum sunscreen SPF 30+ to sun-exposed areas, reapplying every 2 hours as needed when you're outdoors.    Due to recent changes in healthcare laws, you may see results of your pathology and/or laboratory studies on MyChart before the doctors have had a chance to review them. We understand that in some cases there may be results that are confusing or concerning to you. Please understand that not all results are received at the same time and often the doctors may need to interpret multiple results in order to provide you   with the best plan of care or course of treatment. Therefore, we ask that you please give us 2 business days to thoroughly review all your results before contacting the office for clarification. Should we see a critical lab result, you will be contacted sooner.   If You Need Anything After Your Visit  If you have any questions or concerns for your doctor, please call our main line at [phone] and press option 4 to reach your doctor's medical assistant. If no one answers, please leave a voicemail as directed and we will return your call as soon as possible. Messages left after 4  pm will be answered the following business day.   You may also send us a message via MyChart. We typically respond to MyChart messages within 1-2 business days.  For prescription refills, please ask your pharmacy to contact our office. Our fax number is [phone].  If you have an urgent issue when the clinic is closed that cannot wait until the next business day, you can page your doctor at the number below.    Please note that while we do our best to be available for urgent issues outside of office hours, we are not available 24/7.   If you have an urgent issue and are unable to reach us, you may choose to seek medical care at your doctor's office, retail clinic, urgent care center, or emergency room.  If you have a medical emergency, please immediately call 911 or go to the emergency department.  Pager Numbers  - Dr. Kowalski: [phone]  - Dr. Moye: [phone]  - Dr. Stewart: [phone]  In the event of inclement weather, please call our main line at [phone] for an update on the status of any delays or closures.  Dermatology Medication Tips: Please keep the boxes that topical medications come in in order to help keep track of the instructions about where and how to use these. Pharmacies typically print the medication instructions only on the boxes and not directly on the medication tubes.   If your medication is too expensive, please contact our office at [phone] option 4 or send us a message through MyChart.   We are unable to tell what your co-pay for medications will be in advance as this is different depending on your insurance coverage. However, we may be able to find a substitute medication at lower cost or fill out paperwork to get insurance to cover a needed medication.   If a prior authorization is required to get your medication covered by your insurance company, please allow us 1-2 business days to complete this process.  Drug prices often vary  depending on where the prescription is filled and some pharmacies may offer cheaper prices.  The website www.goodrx.com contains coupons for medications through different pharmacies. The prices here do not account for what the cost may be with help from insurance (it may be cheaper with your insurance), but the website can give you the price if you did not use any insurance.  - You can print the associated coupon and take it with your prescription to the pharmacy.  - You may also stop by our office during regular business hours and pick up a GoodRx coupon card.  - If you need your prescription sent electronically to a different pharmacy, notify our office through [facility] MyChart or by phone at [phone] option 4.     Si Usted Necesita Algo Despus de Su Visita  Tambin puede enviarnos un mensaje a travs   de MyChart. Por lo general respondemos a los mensajes de MyChart en el transcurso de 1 a 2 das hbiles.  Para renovar recetas, por favor pida a su farmacia que se ponga en contacto con nuestra oficina. Nuestro nmero de fax es el [phone].  Si tiene un asunto urgente cuando la clnica est cerrada y que no puede esperar hasta el siguiente da hbil, puede llamar/localizar a su doctor(a) al nmero que aparece a continuacin.   Por favor, tenga en cuenta que aunque hacemos todo lo posible para estar disponibles para asuntos urgentes fuera del horario de oficina, no estamos disponibles las 24 horas del da, los 7 das de la semana.   Si tiene un problema urgente y no puede comunicarse con nosotros, puede optar por buscar atencin mdica  en el consultorio de su doctor(a), en una clnica privada, en un centro de atencin urgente o en una sala de emergencias.  Si tiene una emergencia mdica, por favor llame inmediatamente al 911 o vaya a la sala de emergencias.  Nmeros de bper  - Dr. Kowalski: [phone]  - Dra. Moye: [phone]  - Dra. Stewart: [phone]  En caso de  inclemencias del tiempo, por favor llame a nuestra lnea principal al [phone] para una actualizacin sobre el estado de cualquier retraso o cierre.  Consejos para la medicacin en dermatologa: Por favor, guarde las cajas en las que vienen los medicamentos de uso tpico para ayudarle a seguir las instrucciones sobre dnde y cmo usarlos. Las farmacias generalmente imprimen las instrucciones del medicamento slo en las cajas y no directamente en los tubos del medicamento.   Si su medicamento es muy caro, por favor, pngase en contacto con nuestra oficina llamando al [phone] y presione la opcin 4 o envenos un mensaje a travs de MyChart.   No podemos decirle cul ser su copago por los medicamentos por adelantado ya que esto es diferente dependiendo de la cobertura de su seguro. Sin embargo, es posible que podamos encontrar un medicamento sustituto a menor costo o llenar un formulario para que el seguro cubra el medicamento que se considera necesario.   Si se requiere una autorizacin previa para que su compaa de seguros cubra su medicamento, por favor permtanos de 1 a 2 das hbiles para completar este proceso.  Los precios de los medicamentos varan con frecuencia dependiendo del lugar de dnde se surte la receta y alguna farmacias pueden ofrecer precios ms baratos.  El sitio web www.goodrx.com tiene cupones para medicamentos de diferentes farmacias. Los precios aqu no tienen en cuenta lo que podra costar con la ayuda del seguro (puede ser ms barato con su seguro), pero el sitio web puede darle el precio si no utiliz ningn seguro.  - Puede imprimir el cupn correspondiente y llevarlo con su receta a la farmacia.  - Tambin puede pasar por nuestra oficina durante el horario de atencin regular y recoger una tarjeta de cupones de GoodRx.  - Si necesita que su receta se enve electrnicamente a una farmacia diferente, informe a nuestra oficina a travs de MyChart de [facility] o  por telfono llamando al [phone] y presione la opcin 4.  

## 2022-04-03 ENCOUNTER — Telehealth: Payer: Self-pay

## 2022-04-03 ENCOUNTER — Ambulatory Visit
Admission: RE | Admit: 2022-04-03 | Discharge: 2022-04-03 | Disposition: A | Discharge status: Home / Self Care | Payer: 59 | Source: Ambulatory Visit | Attending: Internal Medicine | Admitting: Internal Medicine

## 2022-04-03 DIAGNOSIS — Z1231 Encounter for screening mammogram for malignant neoplasm of breast: Secondary | ICD-10-CM

## 2022-04-03 NOTE — Telephone Encounter (Signed)
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 On the spectrum from normal mole to melanoma skin cancer, this is in between the two but closer towards a melanoma skin cancer.  - The treatment of choice for severely atypical moles is to cut them out in clinic with an area of normal looking skin around them to get all the atypical cells out. The skin that is removed will be sent to check under the microscope again to be sure it looks completely out.   - People who have a history of atypical moles do have a slightly increased risk of developing melanoma somewhere on the body, so a full body skin exam by a dermatologist is recommended at least once a year. - Monthly self skin checks and daily sun protection are also recommended.  - Please also call if you notice any new or changing spots anywhere else on the body before your follow-up visit.   MAs please call with results and schedule. Let me know if she has any questions. Thank you!

## 2022-04-03 NOTE — Telephone Encounter (Signed)
Patient advised bx showed severely atypical mole, scheduled for surgery 11/15 at [facility]

## 2022-04-05 ENCOUNTER — Encounter: Payer: Self-pay | Admitting: Dermatology

## 2022-04-26 ENCOUNTER — Ambulatory Visit: Payer: 59 | Admitting: Dermatology

## 2022-04-26 ENCOUNTER — Encounter: Payer: Self-pay | Admitting: Dermatology

## 2022-04-26 DIAGNOSIS — D492 Neoplasm of unspecified behavior of bone, soft tissue, and skin: Secondary | ICD-10-CM

## 2022-04-26 DIAGNOSIS — D2272 Melanocytic nevi of left lower limb, including hip: Secondary | ICD-10-CM

## 2022-04-26 MED ORDER — MUPIROCIN 2 % EX OINT: 1.0000 | TOPICAL_OINTMENT | Freq: Every day | CUTANEOUS | 0 refills | Status: DC

## 2022-04-26 NOTE — Patient Instructions (Signed)
Wound Care Instructions for After Surgery  On the day following your surgery, you should begin doing daily dressing changes until your sutures are removed: Remove the bandage. Cleanse the wound gently with soap and water.  Make sure you then dry the skin surrounding the wound completely or the tape will not stick to the skin. Do not use cotton balls on the wound. After the wound is clean and dry, apply the ointment (either prescription antibiotic prescribed by your doctor or plain Vaseline if nothing was prescribed) gently with a Q-tip. If you are using a bandaid to cover: Apply a bandaid large enough to cover the entire wound. If you do not have a bandaid large enough to cover the wound OR if you are sensitive to bandaid adhesive: Cut a non-stick pad (such as Telfa) to fit the size of the wound.  Cover the wound with the non-stick pad. If the wound is draining, you may want to add a small amount of gauze on top of the non-stick pad for a little added compression to the area. Use tape to seal the area completely.  For the next 1-2 weeks: Be sure to keep the wound moist with ointment 24/7 to ensure best healing. If you are unable to cover the wound with a bandage to hold the ointment in place, you may need to reapply the ointment several times a day. Do not bend over or lift heavy items to reduce the chance of elevated blood pressure to the wound. Do not participate in particularly strenuous activities.  Below is a list of dressing supplies you might need.  Cotton-tipped applicators - Q-tips Gauze pads (2x2 and/or 4x4) - [employer] Sponges New and clean tube of petroleum jelly (Vaseline) OR prescription antibiotic ointment if prescribed Either a bandaid large enough to cover the entire wound OR non-stick dressing material (Telfa) and Tape (Paper or Hypafix)  FOR ADULT SURGERY PATIENTS: If you need something for pain relief, you may take 1 extra strength Tylenol (acetaminophen) and 2  ibuprofen (200 mg) together every 4 hours as needed. (Do not take these medications if you are allergic to them or if you know you cannot take them for any other reason). Typically you may only need pain medication for 1-3 days.   Comments on the Post-Operative Period Slight swelling and redness often appear around the wound. This is normal and will disappear within several days following the surgery. The healing wound will drain a brownish-red-yellow discharge during healing. This is a normal phase of wound healing. As the wound begins to heal, the drainage may increase in amount. Again, this drainage is normal. Notify us if the drainage becomes persistently bloody, excessively swollen, or intensely painful or develops a foul odor or red streaks.  The healing wound will also typically be itchy. This is normal. If you have severe or persistent pain, Notify us if the discomfort is severe or persistent. Avoid alcoholic beverages when taking pain medicine.  In Case of Wound Hemorrhage A wound hemorrhage is when the bandage suddenly becomes soaked with bright red blood and flows profusely. If this happens, sit down or lie down with your head elevated. If the wound has a dressing on it, do not remove the dressing. Apply pressure to the existing gauze. If the wound is not covered, use a gauze pad to apply pressure and continue applying the pressure for 20 minutes without peeking. DO NOT COVER THE WOUND WITH A LARGE TOWEL OR WASH CLOTH. Release your hand from the   wound site but do not remove the dressing. If the bleeding has stopped, gently clean around the wound. Leave the dressing in place for 24 hours if possible. This wait time allows the blood vessels to close off so that you do not spark a new round of bleeding by disrupting the newly clotted blood vessels with an immediate dressing change. If the bleeding does not subside, continue to hold pressure for 40 minutes. If bleeding continues, page your  physician, contact an After Hours clinic or go to the Emergency Room.  Due to recent changes in healthcare laws, you may see results of your pathology and/or laboratory studies on MyChart before the doctors have had a chance to review them. We understand that in some cases there may be results that are confusing or concerning to you. Please understand that not all results are received at the same time and often the doctors may need to interpret multiple results in order to provide you with the best plan of care or course of treatment. Therefore, we ask that you please give us 2 business days to thoroughly review all your results before contacting the office for clarification. Should we see a critical lab result, you will be contacted sooner.   If You Need Anything After Your Visit  If you have any questions or concerns for your doctor, please call our main line at [phone] and press option 4 to reach your doctor's medical assistant. If no one answers, please leave a voicemail as directed and we will return your call as soon as possible. Messages left after 4 pm will be answered the following business day.   You may also send us a message via MyChart. We typically respond to MyChart messages within 1-2 business days.  For prescription refills, please ask your pharmacy to contact our office. Our fax number is [phone].  If you have an urgent issue when the clinic is closed that cannot wait until the next business day, you can page your doctor at the number below.    Please note that while we do our best to be available for urgent issues outside of office hours, we are not available 24/7.   If you have an urgent issue and are unable to reach us, you may choose to seek medical care at your doctor's office, retail clinic, urgent care center, or emergency room.  If you have a medical emergency, please immediately call 911 or go to the emergency department.  Pager Numbers  - Dr. Kowalski:  [phone]  - Dr. Moye: [phone]  - Dr. Stewart: [phone]  In the event of inclement weather, please call our main line at [phone] for an update on the status of any delays or closures.  Dermatology Medication Tips: Please keep the boxes that topical medications come in in order to help keep track of the instructions about where and how to use these. Pharmacies typically print the medication instructions only on the boxes and not directly on the medication tubes.   If your medication is too expensive, please contact our office at [phone] option 4 or send us a message through MyChart.   We are unable to tell what your co-pay for medications will be in advance as this is different depending on your insurance coverage. However, we may be able to find a substitute medication at lower cost or fill out paperwork to get insurance to cover a needed medication.   If a prior authorization is required to get your medication covered by   your insurance company, please allow us 1-2 business days to complete this process.  Drug prices often vary depending on where the prescription is filled and some pharmacies may offer cheaper prices.  The website www.goodrx.com contains coupons for medications through different pharmacies. The prices here do not account for what the cost may be with help from insurance (it may be cheaper with your insurance), but the website can give you the price if you did not use any insurance.  - You can print the associated coupon and take it with your prescription to the pharmacy.  - You may also stop by our office during regular business hours and pick up a GoodRx coupon card.  - If you need your prescription sent electronically to a different pharmacy, notify our office through [facility] MyChart or by phone at [phone] option 4.     Si Usted Necesita Algo Despus de Su Visita  Tambin puede enviarnos un mensaje a travs de MyChart. Por lo general  respondemos a los mensajes de MyChart en el transcurso de 1 a 2 das hbiles.  Para renovar recetas, por favor pida a su farmacia que se ponga en contacto con nuestra oficina. Nuestro nmero de fax es el [phone].  Si tiene un asunto urgente cuando la clnica est cerrada y que no puede esperar hasta el siguiente da hbil, puede llamar/localizar a su doctor(a) al nmero que aparece a continuacin.   Por favor, tenga en cuenta que aunque hacemos todo lo posible para estar disponibles para asuntos urgentes fuera del horario de oficina, no estamos disponibles las 24 horas del da, los 7 das de la semana.   Si tiene un problema urgente y no puede comunicarse con nosotros, puede optar por buscar atencin mdica  en el consultorio de su doctor(a), en una clnica privada, en un centro de atencin urgente o en una sala de emergencias.  Si tiene una emergencia mdica, por favor llame inmediatamente al 911 o vaya a la sala de emergencias.  Nmeros de bper  - Dr. Kowalski: [phone]  - Dra. Moye: [phone]  - Dra. Stewart: [phone]  En caso de inclemencias del tiempo, por favor llame a nuestra lnea principal al [phone] para una actualizacin sobre el estado de cualquier retraso o cierre.  Consejos para la medicacin en dermatologa: Por favor, guarde las cajas en las que vienen los medicamentos de uso tpico para ayudarle a seguir las instrucciones sobre dnde y cmo usarlos. Las farmacias generalmente imprimen las instrucciones del medicamento slo en las cajas y no directamente en los tubos del medicamento.   Si su medicamento es muy caro, por favor, pngase en contacto con nuestra oficina llamando al [phone] y presione la opcin 4 o envenos un mensaje a travs de MyChart.   No podemos decirle cul ser su copago por los medicamentos por adelantado ya que esto es diferente dependiendo de la cobertura de su seguro. Sin embargo, es posible que podamos encontrar un  medicamento sustituto a menor costo o llenar un formulario para que el seguro cubra el medicamento que se considera necesario.   Si se requiere una autorizacin previa para que su compaa de seguros cubra su medicamento, por favor permtanos de 1 a 2 das hbiles para completar este proceso.  Los precios de los medicamentos varan con frecuencia dependiendo del lugar de dnde se surte la receta y alguna farmacias pueden ofrecer precios ms baratos.  El sitio web www.goodrx.com tiene cupones para medicamentos de diferentes farmacias. Los precios aqu no tienen   en cuenta lo que podra costar con la ayuda del seguro (puede ser ms barato con su seguro), pero el sitio web puede darle el precio si no utiliz ningn seguro.  - Puede imprimir el cupn correspondiente y llevarlo con su receta a la farmacia.  - Tambin puede pasar por nuestra oficina durante el horario de atencin regular y recoger una tarjeta de cupones de GoodRx.  - Si necesita que su receta se enve electrnicamente a una farmacia diferente, informe a nuestra oficina a travs de MyChart de [facility] o por telfono llamando al [phone] y presione la opcin 4.  

## 2022-04-26 NOTE — Progress Notes (Signed)
   Follow-Up Visit   Subjective  Mary Schaefer is a 61 y.o. female who presents for the following: Procedure (Patient here today for excision of bx proven atypical nevus at left lateral thigh. ).  Patient accompanied by husband.   The following portions of the chart were reviewed this encounter and updated as appropriate:   Tobacco  Allergies  Meds  Problems  Med Hx  Surg Hx  Fam Hx      Review of Systems:  No other skin or systemic complaints except as noted in HPI or Assessment and Plan.  Objective  Well appearing patient in no apparent distress; mood and affect are within normal limits.  A focused examination was performed including leg. Relevant physical exam findings are noted in the Assessment and Plan.  left lateral thigh Healing biopsy site [phone]    Assessment & Plan  Neoplasm of skin left lateral thigh  Skin excision  Lesion length (cm):  0.5 Lesion width (cm):  0.5 Total excision diameter (cm):  1.5 Informed consent: discussed and consent obtained   Timeout: patient name, date of birth, surgical site, and procedure verified   Procedure prep:  Patient was prepped and draped in usual sterile fashion Prep type:  Chlorhexidine Anesthesia: the lesion was anesthetized in a standard fashion   Anesthetic:  1% lidocaine w/ epinephrine 1-100,000 buffered w/ 8.4% NaHCO3 (6cc bupivicaine, 6cc lido w/epi, 4cc lido) Instrument used: #15 blade   Hemostasis achieved with: pressure and electrodesiccation    Skin repair Complexity:  Intermediate Final length (cm):  4.8 Informed consent: discussed and consent obtained   Timeout: patient name, date of birth, surgical site, and procedure verified   Procedure prep:  Patient was prepped and draped in usual sterile fashion Prep type:  Chlorhexidine Anesthesia: the lesion was anesthetized in a standard fashion   Anesthetic:  1% lidocaine w/ epinephrine 1-100,000 local infiltration Reason for type of repair: reduce  tension to allow closure, reduce the risk of dehiscence, infection, and necrosis, preserve normal anatomy and preserve normal anatomical and functional relationships   Undermining: edges undermined   Subcutaneous layers (deep stitches):  Suture size:  3-0 Suture type: Vicryl (polyglactin 910)   Fine/surface layer approximation (top stitches):  Suture size:  4-0 Suture type: Prolene (polypropylene)   Suture removal (days):  7 Hemostasis achieved with: suture, pressure and electrodesiccation Outcome: patient tolerated procedure well with no complications   Post-procedure details: wound care instructions given   Additional details:  Mupirocin and a pressure dressing applied  mupirocin ointment (BACTROBAN) 2 % Apply 1 Application topically daily.  Specimen 1 - Surgical pathology Differential Diagnosis: Bx proven dysplastic nevus with severe atypia  Check Margins: yes Healing biopsy site [id number]   Return in about 1 week (around 05/03/2022) for Suture Removal.  Graciella Belton, RMA, am acting as scribe for Forest Gleason, MD .  Documentation: I have reviewed the above documentation for accuracy and completeness, and I agree with the above.  Forest Gleason, MD

## 2022-04-27 ENCOUNTER — Encounter: Payer: Self-pay | Admitting: Dermatology

## 2022-05-02 ENCOUNTER — Telehealth: Payer: Self-pay

## 2022-05-02 NOTE — Telephone Encounter (Signed)
-----   Message from Alfonso Patten, MD sent at 05/02/2022 10:46 AM EST ----- Skin (M), left lateral thigh EXCISION, PERSISTENT DYSPLASTIC NEVUS, MARGINS FREE  Entire lesion appears to be out. No additional treatment needed at this time. Please call our office [phone] with any questions.    MAs please call. Thank you!

## 2022-05-02 NOTE — Telephone Encounter (Signed)
Discussed pathology results. Patient voiced understanding. [place] for S/R tomorrow.

## 2022-05-03 ENCOUNTER — Ambulatory Visit (INDEPENDENT_AMBULATORY_CARE_PROVIDER_SITE_OTHER): Payer: 59 | Admitting: Dermatology

## 2022-05-03 DIAGNOSIS — Z4802 Encounter for removal of sutures: Secondary | ICD-10-CM

## 2022-05-03 NOTE — Progress Notes (Signed)
   Follow-Up Visit   Subjective  Mary Schaefer is a 61 y.o. female who presents for the following: Suture / Staple Removal.  The following portions of the chart were reviewed this encounter and updated as appropriate:   Tobacco  Allergies  Meds  Problems  Med Hx  Surg Hx  Fam Hx      Review of Systems:  No other skin or systemic complaints except as noted in HPI or Assessment and Plan.  Objective  Well appearing patient in no apparent distress; mood and affect are within normal limits.  A focused examination was performed including left thigh. Relevant physical exam findings are noted in the Assessment and Plan.    Assessment & Plan   Encounter for Removal of Sutures - Incision site at the left lateral thigh is clean, dry and intact - Wound cleansed, sutures removed, wound cleansed and steri strips applied.  - Discussed pathology results showing dysplastic nevus, margins free - Patient advised to keep steri-strips dry until they fall off. - Scars remodel for a full year. - Once steri-strips fall off, patient can apply over-the-counter silicone scar cream each night to help with scar remodeling if desired. - Patient advised to call with any concerns or if they notice any new or changing lesions.  Return in about 5 months (around 10/02/2022) for TBSE, Hx Dysplastic Nevi.  Graciella Belton, RMA, am acting as scribe for Forest Gleason, MD .  Documentation: I have reviewed the above documentation for accuracy and completeness, and I agree with the above.  Forest Gleason, MD

## 2022-05-03 NOTE — Patient Instructions (Signed)
Recommend Serica moisturizing scar formula cream every night or [facility] brand or Mederma silicone scar sheet every night for the first year after a scar appears to help with scar remodeling if desired. Scars remodel on their own for a full year and will gradually improve in appearance over time.    Due to recent changes in healthcare laws, you may see results of your pathology and/or laboratory studies on MyChart before the doctors have had a chance to review them. We understand that in some cases there may be results that are confusing or concerning to you. Please understand that not all results are received at the same time and often the doctors may need to interpret multiple results in order to provide you with the best plan of care or course of treatment. Therefore, we ask that you please give us 2 business days to thoroughly review all your results before contacting the office for clarification. Should we see a critical lab result, you will be contacted sooner.   If You Need Anything After Your Visit  If you have any questions or concerns for your doctor, please call our main line at [phone] and press option 4 to reach your doctor's medical assistant. If no one answers, please leave a voicemail as directed and we will return your call as soon as possible. Messages left after 4 pm will be answered the following business day.   You may also send us a message via MyChart. We typically respond to MyChart messages within 1-2 business days.  For prescription refills, please ask your pharmacy to contact our office. Our fax number is [phone].  If you have an urgent issue when the clinic is closed that cannot wait until the next business day, you can page your doctor at the number below.    Please note that while we do our best to be available for urgent issues outside of office hours, we are not available 24/7.   If you have an urgent issue and are unable to reach us, you may choose to  seek medical care at your doctor's office, retail clinic, urgent care center, or emergency room.  If you have a medical emergency, please immediately call 911 or go to the emergency department.  Pager Numbers  - Dr. Kowalski: [phone]  - Dr. Moye: [phone]  - Dr. Stewart: [phone]  In the event of inclement weather, please call our main line at [phone] for an update on the status of any delays or closures.  Dermatology Medication Tips: Please keep the boxes that topical medications come in in order to help keep track of the instructions about where and how to use these. Pharmacies typically print the medication instructions only on the boxes and not directly on the medication tubes.   If your medication is too expensive, please contact our office at [phone] option 4 or send us a message through MyChart.   We are unable to tell what your co-pay for medications will be in advance as this is different depending on your insurance coverage. However, we may be able to find a substitute medication at lower cost or fill out paperwork to get insurance to cover a needed medication.   If a prior authorization is required to get your medication covered by your insurance company, please allow us 1-2 business days to complete this process.  Drug prices often vary depending on where the prescription is filled and some pharmacies may offer cheaper prices.  The website www.goodrx.com contains coupons for medications   through different pharmacies. The prices here do not account for what the cost may be with help from insurance (it may be cheaper with your insurance), but the website can give you the price if you did not use any insurance.  - You can print the associated coupon and take it with your prescription to the pharmacy.  - You may also stop by our office during regular business hours and pick up a GoodRx coupon card.  - If you need your prescription sent electronically to a  different pharmacy, notify our office through  MyChart or by phone at [phone] option 4.     Si Usted Necesita Algo Despus de Su Visita  Tambin puede enviarnos un mensaje a travs de MyChart. Por lo general respondemos a los mensajes de MyChart en el transcurso de 1 a 2 das hbiles.  Para renovar recetas, por favor pida a su farmacia que se ponga en contacto con nuestra oficina. Nuestro nmero de fax es el [phone].  Si tiene un asunto urgente cuando la clnica est cerrada y que no puede esperar hasta el siguiente da hbil, puede llamar/localizar a su doctor(a) al nmero que aparece a continuacin.   Por favor, tenga en cuenta que aunque hacemos todo lo posible para estar disponibles para asuntos urgentes fuera del horario de oficina, no estamos disponibles las 24 horas del da, los 7 das de la semana.   Si tiene un problema urgente y no puede comunicarse con nosotros, puede optar por buscar atencin mdica  en el consultorio de su doctor(a), en una clnica privada, en un centro de atencin urgente o en una sala de emergencias.  Si tiene una emergencia mdica, por favor llame inmediatamente al 911 o vaya a la sala de emergencias.  Nmeros de bper  - Dr. Kowalski: [phone]  - Dra. Moye: [phone]  - Dra. Stewart: [phone]  En caso de inclemencias del tiempo, por favor llame a nuestra lnea principal al [phone] para una actualizacin sobre el estado de cualquier retraso o cierre.  Consejos para la medicacin en dermatologa: Por favor, guarde las cajas en las que vienen los medicamentos de uso tpico para ayudarle a seguir las instrucciones sobre dnde y cmo usarlos. Las farmacias generalmente imprimen las instrucciones del medicamento slo en las cajas y no directamente en los tubos del medicamento.   Si su medicamento es muy caro, por favor, pngase en contacto con nuestra oficina llamando al [phone] y presione la opcin 4 o envenos un  mensaje a travs de MyChart.   No podemos decirle cul ser su copago por los medicamentos por adelantado ya que esto es diferente dependiendo de la cobertura de su seguro. Sin embargo, es posible que podamos encontrar un medicamento sustituto a menor costo o llenar un formulario para que el seguro cubra el medicamento que se considera necesario.   Si se requiere una autorizacin previa para que su compaa de seguros cubra su medicamento, por favor permtanos de 1 a 2 das hbiles para completar este proceso.  Los precios de los medicamentos varan con frecuencia dependiendo del lugar de dnde se surte la receta y alguna farmacias pueden ofrecer precios ms baratos.  El sitio web www.goodrx.com tiene cupones para medicamentos de diferentes farmacias. Los precios aqu no tienen en cuenta lo que podra costar con la ayuda del seguro (puede ser ms barato con su seguro), pero el sitio web puede darle el precio si no utiliz ningn seguro.  - Puede imprimir el cupn correspondiente y llevarlo   con su receta a la farmacia.  - Tambin puede pasar por nuestra oficina durante el horario de atencin regular y recoger una tarjeta de cupones de GoodRx.  - Si necesita que su receta se enve electrnicamente a una farmacia diferente, informe a nuestra oficina a travs de MyChart de [place] o por telfono llamando al [phone] y presione la opcin 4.  

## 2022-05-09 ENCOUNTER — Encounter: Payer: Self-pay | Admitting: Dermatology

## 2022-05-18 ENCOUNTER — Ambulatory Visit: Payer: 59 | Admitting: Internal Medicine

## 2022-05-18 ENCOUNTER — Encounter: Payer: Self-pay | Admitting: Internal Medicine

## 2022-05-18 VITALS — BP 122/74 | HR 108 | Temp 97.1°F | Wt 170.0 lb

## 2022-05-18 DIAGNOSIS — G44209 Tension-type headache, unspecified, not intractable: Secondary | ICD-10-CM

## 2022-05-18 MED ORDER — BUTALBITAL-APAP-CAFFEINE 50-325-40 MG PO TABS: 1.0000 | ORAL_TABLET | Freq: Four times a day (QID) | ORAL | 0 refills | Status: DC | PRN

## 2022-05-18 MED ORDER — PROMETHAZINE HCL 25 MG PO TABS: 25.0000 mg | ORAL_TABLET | Freq: Three times a day (TID) | ORAL | 0 refills | Status: DC | PRN

## 2022-05-18 NOTE — Progress Notes (Signed)
Subjective:    Patient ID: Mary Schaefer, female    DOB: [birth year], 61 y.o.   MRN: [id number]  HPI  Patient presents to clinic today with complaint of headaches and nausea.  This started started a few weeks ago when she lost her job.  The headache is located in the back of her head and radiate up.  She describes the pain as throbbing and achy.  She reports associated vomiting.  She denies dizziness, sensitivity to light and sound. She has tried heat, laying in a dark room and takes Tylenol OTC with some relief of symptoms.  She does feel like these are stress related.  Review of Systems     Past Medical History:  Diagnosis Date   Blood in stool    Childhood asthma    Diabetes mellitus without complication ([place])    Dysplastic nevus 03/28/2022   left lateral thigh, excised 04/26/22, margins free   Endometriosis 1997   Hypertension    Thyroid disease     Current Outpatient Medications  Medication Sig Dispense Refill   ACCU-CHEK GUIDE test strip USE AS INSTRUCTED 100 strip 12   acetaminophen (TYLENOL) 500 MG tablet Take 1,000 mg by mouth as needed for mild pain.     Ascorbic Acid (VITAMIN C PO) Take 1 tablet by mouth daily.     busPIRone (BUSPAR) 7.5 MG tablet TAKE 1 TABLET BY MOUTH TWICE A DAY (Patient not taking: Reported on 01/13/2022) 180 tablet 0   Calcium Carbonate-Vitamin D3 600-400 MG-UNIT TABS Take by mouth.     cyanocobalamin 2000 MCG tablet Take 2,000 mcg by mouth 2 (two) times daily.     cyclobenzaprine (FLEXERIL) 5 MG tablet Take 5 mg by mouth 3 (three) times daily as needed.     ezetimibe (ZETIA) 10 MG tablet Take 1 tablet (10 mg total) by mouth daily. 90 tablet 1   FLOWFLEX COVID-19 AG HOME TEST KIT REFER MANUFACTURER INSTRUCTIONS PER PACKAGING     gabapentin (NEURONTIN) 100 MG capsule TAKE 1 CAPSULE BY MOUTH EVERYDAY AT BEDTIME 90 capsule 1   glucose blood test strip Check sugar BID as needed. DX E11.9 100 each 6   hydrOXYzine (VISTARIL) 25 MG capsule Take 1 capsule  (25 mg total) by mouth daily as needed. 90 capsule 0   levothyroxine (SYNTHROID) 112 MCG tablet TAKE 1 TABLET BY MOUTH EVERY DAY(NEED APPT FOR LAB) 90 tablet 2   lisinopril-hydrochlorothiazide (ZESTORETIC) 10-12.5 MG tablet TAKE 1 TABLET BY MOUTH EVERY DAY 90 tablet 1   meloxicam (MOBIC) 15 MG tablet Take 15 mg by mouth daily.     metFORMIN (GLUCOPHAGE-XR) 500 MG 24 hr tablet TAKE 2 TABLETS (1,000 MG TOTAL) BY MOUTH IN THE MORNING AND AT BEDTIME. 360 tablet 1   methylPREDNISolone (MEDROL DOSEPAK) 4 MG TBPK tablet Take by mouth as directed.     mupirocin ointment (BACTROBAN) 2 % Apply 1 Application topically daily. 22 g 0   omeprazole (PRILOSEC) 10 MG capsule Take 10 mg by mouth daily.     prednisoLONE acetate (PRED FORTE) 1 % ophthalmic suspension INSTILL 1 DROP INTO LEFT EYE 4 TIMES A DAY     prednisoLONE acetate (PRED FORTE) 1 % ophthalmic suspension Apply to eye.     rosuvastatin (CRESTOR) 10 MG tablet TAKE 1 TABLET BY MOUTH EVERY DAY 90 tablet 1   No current facility-administered medications for this visit.    Allergies  Allergen Reactions   Aspirin Shortness Of Breath and Hives   Penicillins Hives  Family History  Problem Relation Age of Onset   Ovarian cancer Mother    Hyperlipidemia Mother    Alcohol abuse Father    Hyperlipidemia Father    Heart disease Father    Stroke Father    Hypertension Father    Diabetes Sister    Colon cancer Neg Hx    Esophageal cancer Neg Hx    Breast cancer Neg Hx     Social History   Socioeconomic History   Marital status: Married    Spouse name: Not on file   Number of children: Not on file   Years of education: Not on file   Highest education level: Not on file  Occupational History   Not on file  Tobacco Use   Smoking status: Never   Smokeless tobacco: Never  Vaping Use   Vaping Use: Never used  Substance and Sexual Activity   Alcohol use: Yes    Alcohol/week: 1.0 - 2.0 standard drink of alcohol    Types: 1 - 2 Glasses of  wine per week    Comment: rare   Drug use: Not Currently   Sexual activity: Yes  Other Topics Concern   Not on file  Social History Narrative   ** Merged History Encounter **       Social Determinants of Health   Financial Resource Strain: Not on file  Food Insecurity: Not on file  Transportation Needs: Not on file  Physical Activity: Not on file  Stress: Not on file  Social Connections: Not on file  Intimate Partner Violence: Not on file     Constitutional: Pt reports headaches. Denies fever, malaise, fatigue, or abrupt weight changes.  HEENT: Denies eye pain, eye redness, ear pain, ringing in the ears, wax buildup, runny nose, nasal congestion, bloody nose, or sore throat. Respiratory: Denies difficulty breathing, shortness of breath, cough or sputum production.   Cardiovascular: Denies chest pain, chest tightness, palpitations or swelling in the hands or feet.  Gastrointestinal: Pt reports nausea and vomiting. Denies abdominal pain, bloating, constipation, diarrhea or blood in the stool.  GU: Denies urgency, frequency, pain with urination, burning sensation, blood in urine, odor or discharge. Musculoskeletal: Pt reports muscle tension in neck. Denies decrease in range of motion, difficulty with gait, or joint pain and swelling.  Skin: Denies redness, rashes, lesions or ulcercations.  Neurological: Denies dizziness, difficulty with memory, difficulty with speech or problems with balance and coordination.  Psych: Pt reports stress, anxiety. Denies depression, SI/HI.  No other specific complaints in a complete review of systems (except as listed in HPI above).  Objective:   Physical Exam BP 122/74 (BP Location: Left Arm, Patient Position: Sitting, Cuff Size: Normal)   Pulse (!) 108   Temp (!) 97.1 F (36.2 C) (Temporal)   Wt 170 lb (77.1 kg)   LMP 09/10/2016   SpO2 99%   BMI 27.44 kg/m   Wt Readings from Last 3 Encounters:  03/20/22 175 lb (79.4 kg)  01/13/22 173 lb  (78.5 kg)  04/14/21 171 lb (77.6 kg)    General: Appears her stated age, overweight, in NAD. HEENT: Head: normal shape and size; Eyes: sclera white, no icterus, conjunctiva pink, PERRLA and EOMs intact;  Cardiovascular: Normal rate and rhythm. S1,S2 noted.  No murmur, rubs or gallops noted.  Pulmonary/Chest: Normal effort and positive vesicular breath sounds. No respiratory distress. No wheezes, rales or ronchi noted.  Musculoskeletal: Normal flexion, extension and rotation of the cervical spine.. No difficulty with gait.  Neurological: Alert and oriented. Coordination normal.  Psychiatric: Mood and affect mildly flat. Behavior is normal. Judgment and thought content normal.    BMET    Component Value Date/Time   NA 136 01/13/2022 1326   K 3.8 01/13/2022 1326   CL 97 (L) 01/13/2022 1326   CO2 27 01/13/2022 1326   GLUCOSE 141 (H) 01/13/2022 1326   BUN 14 01/13/2022 1326   CREATININE 0.59 01/13/2022 1326   CALCIUM 10.2 01/13/2022 1326   GFRNONAA >60 04/05/2021 1500   GFRAA >60 09/17/2017 0732    Lipid Panel     Component Value Date/Time   CHOL 157 01/13/2022 1326   TRIG 205 (H) 01/13/2022 1326   HDL 44 (L) 01/13/2022 1326   CHOLHDL 3.6 01/13/2022 1326   VLDL 43.2 (H) 09/02/2019 0809   LDLCALC 83 01/13/2022 1326    CBC    Component Value Date/Time   WBC 7.6 01/13/2022 1326   RBC 4.84 01/13/2022 1326   HGB 14.8 01/13/2022 1326   HCT 43.8 01/13/2022 1326   PLT 314 01/13/2022 1326   MCV 90.5 01/13/2022 1326   MCH 30.6 01/13/2022 1326   MCHC 33.8 01/13/2022 1326   RDW 12.7 01/13/2022 1326    Hgb A1C Lab Results  Component Value Date   HGBA1C 7.5 (H) 01/13/2022            Assessment & Plan:   Tension Headaches:  Secondary to stress from being let go from her job Rx for Phenergan 25 mg every 8 hours as needed Rx for for Fioricet 1 to 2 tablets every 8 hours as needed Encouraged massage  RTC in 2 months for follow up chronic conditions Webb Silversmith,  NP

## 2022-05-18 NOTE — Patient Instructions (Signed)
Tension Headache, Adult A tension headache is a feeling of pain, pressure, or aching in the head. It is often felt over the front and sides of the head. Tension headaches can last from 30 minutes to several days. What are the causes? The cause of this condition is not known. Sometimes, tension headaches are brought on by stress, worry (anxiety), or depression. Other things that may set them off include: Alcohol. Too much caffeine or caffeine withdrawal. Colds, flu, or sinus infections. Dental problems. This can include clenching your teeth. Being tired. Holding your head and neck in the same position for a long time, such as while using a computer. Smoking. Arthritis in the neck. What are the signs or symptoms? Feeling pressure around the head. A dull ache in the head. Pain over the front and sides of the head. Feeling sore or tender in the muscles of the head, neck, and shoulders. How is this treated? This condition may be treated with lifestyle changes and with medicines that help relieve symptoms. Follow these instructions at home: Managing pain Take over-the-counter and prescription medicines only as told by your doctor. When you have a headache, lie down in a dark, quiet room. If told, put ice on your head and neck. To do this: Put ice in a plastic bag. Place a towel between your skin and the bag. Leave the ice on for 20 minutes, 2-3 times a day. Take off the ice if your skin turns bright red. This is very important. If you cannot feel pain, heat, or cold, you have a greater risk of damage to the area. If told, put heat on the back of your neck. Do this as often as told by your doctor. Use the heat source that your doctor recommends, such as a moist heat pack or a heating pad. Place a towel between your skin and the heat source. Leave the heat on for 20-30 minutes. Take off the heat if your skin turns bright red. This is very important. If you cannot feel pain, heat, or cold, you  have a greater risk of getting burned. Eating and drinking Eat meals on a regular schedule. If you drink alcohol: Limit how much you have to: 0-1 drink a day for women who are not pregnant. 0-2 drinks a day for men. Know how much alcohol is in your drink. In the [place] one drink equals one 12 oz bottle of beer (355 mL), one 5 oz glass of wine (148 mL), or one 1 oz glass of hard liquor (44 mL). Drink enough fluid to keep your pee (urine) pale yellow. Do not use a lot of caffeine, or stop using caffeine. Lifestyle Get 7-9 hours of sleep each night. Or get the amount of sleep that your doctor tells you to. At bedtime, keep computers, phones, and tablets out of your room. Find ways to lessen your stress. This may include: Exercise. Deep breathing. Yoga. Listening to music. Thinking positive thoughts. Sit up straight. Try to relax your muscles. Do not smoke or use any products that contain nicotine or tobacco. If you need help quitting, ask your doctor. General instructions  Avoid things that can bring on headaches. Keep a headache journal to see what may bring on headaches. For example, write down: What you eat and drink. How much sleep you get. Any change to your diet or medicines. Keep all follow-up visits. Contact a doctor if: Your headache does not get better. Your headache comes back. You have a headache, and sounds,   light, or smells bother you. You feel like you may vomit, or you vomit. Your stomach hurts. Get help right away if: You all of a sudden get a very bad headache with any of these things: A stiff neck. Feeling like you may vomit. Vomiting. Feeling mixed up (confused). Feeling weak in one part or one side of your body. Having trouble seeing or speaking, or both. Feeling short of breath. A rash. Feeling very sleepy. Pain in your eye or ear. Trouble walking or balancing. Feeling like you will faint, or you faint. Summary A tension headache is pain, pressure,  or aching in your head. Tension headaches can last from 30 minutes to several days. Lifestyle changes and medicines may help relieve pain. This information is not intended to replace advice given to you by your health care provider. Make sure you discuss any questions you have with your health care provider. Document Revised: 02/26/2020 Document Reviewed: 02/26/2020 Elsevier Patient Education  2023 Elsevier Inc.  

## 2022-05-19 ENCOUNTER — Other Ambulatory Visit: Payer: Self-pay | Admitting: Internal Medicine

## 2022-05-21 ENCOUNTER — Other Ambulatory Visit: Payer: Self-pay | Admitting: Internal Medicine

## 2022-05-21 DIAGNOSIS — I10 Essential (primary) hypertension: Secondary | ICD-10-CM

## 2022-05-22 NOTE — Telephone Encounter (Signed)
Requested Prescriptions  Pending Prescriptions Disp Refills   rosuvastatin (CRESTOR) 10 MG tablet [Pharmacy Med Name: ROSUVASTATIN CALCIUM 10 MG TAB] 90 tablet 1    Sig: TAKE 1 TABLET BY MOUTH EVERY DAY     Cardiovascular:  Antilipid - Statins 2 Failed - 05/19/2022  8:29 PM      Failed - Lipid Panel in normal range within the last 12 months    Cholesterol  Date Value Ref Range Status  01/13/2022 157 <200 mg/dL Final   LDL Cholesterol (Calc)  Date Value Ref Range Status  01/13/2022 83 mg/dL (calc) Final    Comment:    Reference range: <100 . Desirable range <100 mg/dL for primary prevention;   <70 mg/dL for patients with CHD or diabetic patients  with > or = 2 CHD risk factors. [name] LDL-C is now calculated using the Martin-Hopkins  calculation, which is a validated novel method providing  better accuracy than the Friedewald equation in the  estimation of LDL-C.  Cresenciano Genre et al. Annamaria Helling. 3086;578(46): 2061-2068  (http://education.[facility].com/faq/FAQ164)    Direct LDL  Date Value Ref Range Status  07/15/2020 206.0 mg/dL Final    Comment:    Optimal:  <100 mg/dLNear or Above Optimal:  100-129 mg/dLBorderline High:  130-159 mg/dLHigh:  160-189 mg/dLVery High:  >190 mg/dL   HDL  Date Value Ref Range Status  01/13/2022 44 (L) > OR = 50 mg/dL Final   Triglycerides  Date Value Ref Range Status  01/13/2022 205 (H) <150 mg/dL Final    Comment:    . If a non-fasting specimen was collected, consider repeat triglyceride testing on a fasting specimen if clinically indicated.  Yates Decamp et al. J. of Clin. Lipidol. 9629;5:284-132. [name]          Passed - Cr in normal range and within 360 days    Creat  Date Value Ref Range Status  01/13/2022 0.59 0.50 - 1.05 mg/dL Final   Creatinine,U  Date Value Ref Range Status  02/12/2019 75.8 mg/dL Final   Creatinine, Urine  Date Value Ref Range Status  01/13/2022 115 20 - 275 mg/dL Final         Passed - Patient is not pregnant       Passed - Valid encounter within last 12 months    Recent Outpatient Visits           4 days ago Tension headache   [facility] [place], Coralie Keens, NP   4 months ago Encounter for general adult medical examination with abnormal findings   [facility] [place], Coralie Keens, NP   1 year ago LLQ pain   [facility] [place], [place], NP   1 year ago Type 2 diabetes mellitus with hyperglycemia, without long-term current use of insulin [facility])   [facility] [place], Coralie Keens, NP   1 year ago Left hip pain   [facility] [place], Coralie Keens, [place]

## 2022-05-23 NOTE — Telephone Encounter (Signed)
Requested Prescriptions  Pending Prescriptions Disp Refills   lisinopril-hydrochlorothiazide (ZESTORETIC) 10-12.5 MG tablet [Pharmacy Med Name: LISINOPRIL-HCTZ 10-12.5 MG TAB] 90 tablet 1    Sig: TAKE 1 TABLET BY MOUTH EVERY DAY     Cardiovascular:  ACEI + Diuretic Combos Passed - 05/21/2022  4:39 PM      Passed - Na in normal range and within 180 days    Sodium  Date Value Ref Range Status  01/13/2022 136 135 - 146 mmol/L Final         Passed - K in normal range and within 180 days    Potassium  Date Value Ref Range Status  01/13/2022 3.8 3.5 - 5.3 mmol/L Final         Passed - Cr in normal range and within 180 days    Creat  Date Value Ref Range Status  01/13/2022 0.59 0.50 - 1.05 mg/dL Final   Creatinine,U  Date Value Ref Range Status  02/12/2019 75.8 mg/dL Final   Creatinine, Urine  Date Value Ref Range Status  01/13/2022 115 20 - 275 mg/dL Final         Passed - eGFR is 30 or above and within 180 days    GFR calc Af Amer  Date Value Ref Range Status  09/17/2017 >60 >60 mL/min Final    Comment:    (NOTE) The eGFR has been calculated using the CKD EPI equation. This calculation has not been validated in all clinical situations. eGFR's persistently <60 mL/min signify possible Chronic Kidney Disease.    GFR, Estimated  Date Value Ref Range Status  04/05/2021 >60 >60 mL/min Final    Comment:    (NOTE) Calculated using the CKD-EPI Creatinine Equation (2021)    GFR  Date Value Ref Range Status  07/15/2020 71.39 >60.00 mL/min Final    Comment:    Calculated using the CKD-EPI Creatinine Equation (2021)   eGFR  Date Value Ref Range Status  01/13/2022 103 > OR = 60 mL/min/1.2m Final         Passed - Patient is not pregnant      Passed - Last BP in normal range    BP Readings from Last 1 Encounters:  05/18/22 122/74         Passed - Valid encounter within last 6 months    Recent Outpatient Visits           5 days ago [facility]   4 months ago Encounter for general adult medical examination with abnormal findings   [facility]   1 year ago [facility]Q [facility]   [facility] [place] [place]   1 year ago Type 2 diabetes mellitus with hyperglycemia, without long-term current use of insulin ([facility]   [facility]   1 year ago [facility] hip [facility]               ezetimibe (ZETIA) 10 MG tablet [Pharmacy Med Name: EZETIMIBE 10 MG TABLET] 90 tablet 1    Sig: TAKE 1 TABLET BY MOUTH EVERY DAY     Cardiovascular:  Antilipid - Sterol Transport Inhibitors Failed - 05/21/2022  4:39 PM      Failed - AST in normal range and within 360 days    AST  Date Value Ref Range Status  01/13/2022  37 (H) 10 - 35 U/L Final         Failed - ALT in normal range and within 360 days    ALT  Date Value Ref Range Status  01/13/2022 62 (H) 6 - 29 U/L Final         Failed - Lipid Panel in normal range within the last 12 months    Cholesterol  Date Value Ref Range Status  01/13/2022 157 <200 mg/dL Final   LDL Cholesterol (Calc)  Date Value Ref Range Status  01/13/2022 83 mg/dL (calc) Final    Comment:    Reference range: <100 . Desirable range <100 mg/dL for primary prevention;   <70 mg/dL for patients with CHD or diabetic patients  with > or = 2 CHD risk factors. [name] LDL-C is now calculated using the Martin-Hopkins  calculation, which is a validated novel method providing  better accuracy than the Friedewald equation in the  estimation of LDL-C.  Cresenciano Genre et al. Annamaria Helling. 8250;037(04): 2061-2068  (http://education.[facility].com/faq/FAQ164)    Direct LDL  Date Value Ref Range Status  07/15/2020 206.0 mg/dL Final    Comment:    Optimal:  <100 mg/dLNear or Above Optimal:  100-129 mg/dLBorderline High:  130-159 mg/dLHigh:  160-189 mg/dLVery High:  >190 mg/dL    HDL  Date Value Ref Range Status  01/13/2022 44 (L) > OR = 50 mg/dL Final   Triglycerides  Date Value Ref Range Status  01/13/2022 205 (H) <150 mg/dL Final    Comment:    . If a non-fasting specimen was collected, consider repeat triglyceride testing on a fasting specimen if clinically indicated.  Yates Decamp et al. J. of Clin. Lipidol. 8889;1:694-503. [name]          Passed - Patient is not pregnant      Passed - Valid encounter within last 12 months    Recent Outpatient Visits           5 days ago Tension headache   [facility] [place], Coralie Keens, NP   4 months ago Encounter for general adult medical examination with abnormal findings   [facility] [place], Coralie Keens, NP   1 year ago LLQ pain   [facility] [place], [place], [place]   1 year ago Type 2 diabetes mellitus with hyperglycemia, without long-term current use of insulin [facility])   [facility] [place], Coralie Keens, NP   1 year ago Left hip pain   [facility] [place], Coralie Keens, [place]

## 2022-06-06 ENCOUNTER — Other Ambulatory Visit: Payer: Self-pay | Admitting: Family Medicine

## 2022-06-06 ENCOUNTER — Encounter: Payer: Self-pay | Admitting: Family Medicine

## 2022-06-07 ENCOUNTER — Other Ambulatory Visit: Payer: Self-pay | Admitting: *Deleted

## 2022-06-07 MED ORDER — FLUCONAZOLE 150 MG PO TABS: 150.0000 mg | ORAL_TABLET | Freq: Once | ORAL | 3 refills | Status: DC

## 2022-06-13 ENCOUNTER — Encounter: Payer: Self-pay | Admitting: Internal Medicine

## 2022-06-14 ENCOUNTER — Telehealth: Payer: 59 | Admitting: Physician Assistant

## 2022-06-14 DIAGNOSIS — U071 COVID-19: Secondary | ICD-10-CM

## 2022-06-14 MED ORDER — MOLNUPIRAVIR EUA 200MG CAPSULE: 4.0000 | ORAL_CAPSULE | Freq: Two times a day (BID) | ORAL | 0 refills | Status: AC

## 2022-06-14 MED ORDER — PROMETHAZINE-DM 6.25-15 MG/5ML PO SYRP: 5.0000 mL | ORAL_SOLUTION | Freq: Four times a day (QID) | ORAL | 0 refills | Status: DC | PRN

## 2022-06-14 MED ORDER — FLUTICASONE PROPIONATE 50 MCG/ACT NA SUSP: 2.0000 | Freq: Every day | NASAL | 0 refills | Status: DC

## 2022-06-14 MED ORDER — BENZONATATE 100 MG PO CAPS: 100.0000 mg | ORAL_CAPSULE | Freq: Three times a day (TID) | ORAL | 0 refills | Status: DC | PRN

## 2022-06-14 NOTE — Patient Instructions (Signed)
Mary Schaefer, thank you for joining Mar Daring, PA-C for today's virtual visit.  While this provider is not your primary care provider (PCP), if your PCP is located in our provider database this encounter information will be shared with them immediately following your visit.   [facility] account gives you access to today's visit and all your visits, tests, and labs performed at [facility] " click here if you don't have a [facility] account or go to [email or web address]  Consent: (Patient) Mary Schaefer provided verbal consent for this virtual visit at the beginning of the encounter.  Current Medications:  Current Outpatient Medications:    benzonatate (TESSALON) 100 MG capsule, Take 1 capsule (100 mg total) by mouth 3 (three) times daily as needed., Disp: 30 capsule, Rfl: 0   fluticasone (FLONASE) 50 MCG/ACT nasal spray, Place 2 sprays into both nostrils daily., Disp: 16 g, Rfl: 0   molnupiravir EUA (LAGEVRIO) 200 mg CAPS capsule, Take 4 capsules (800 mg total) by mouth 2 (two) times daily for 5 days., Disp: 40 capsule, Rfl: 0   promethazine-dextromethorphan (PROMETHAZINE-DM) 6.25-15 MG/5ML syrup, Take 5 mLs by mouth 4 (four) times daily as needed., Disp: 118 mL, Rfl: 0   ACCU-CHEK GUIDE test strip, USE AS INSTRUCTED, Disp: 100 strip, Rfl: 12   acetaminophen (TYLENOL) 500 MG tablet, Take 1,000 mg by mouth as needed for mild pain., Disp: , Rfl:    Ascorbic Acid (VITAMIN C PO), Take 1 tablet by mouth daily., Disp: , Rfl:    butalbital-acetaminophen-caffeine (FIORICET) 50-325-40 MG tablet, Take 1 tablet by mouth every 6 (six) hours as needed for headache., Disp: 20 tablet, Rfl: 0   Calcium Carbonate-Vitamin D3 600-400 MG-UNIT TABS, Take by mouth., Disp: , Rfl:    cyanocobalamin 2000 MCG tablet, Take 2,000 mcg by mouth 2 (two) times daily., Disp: , Rfl:    cyclobenzaprine (FLEXERIL) 5 MG tablet, Take 5 mg by mouth 3 (three) times daily as needed.,  Disp: , Rfl:    ezetimibe (ZETIA) 10 MG tablet, Take 1 tablet (10 mg total) by mouth daily., Disp: 90 tablet, Rfl: 1   gabapentin (NEURONTIN) 100 MG capsule, TAKE 1 CAPSULE BY MOUTH EVERYDAY AT BEDTIME, Disp: 90 capsule, Rfl: 1   glucose blood test strip, Check sugar BID as needed. DX E11.9, Disp: 100 each, Rfl: 6   hydrOXYzine (VISTARIL) 25 MG capsule, Take 1 capsule (25 mg total) by mouth daily as needed., Disp: 90 capsule, Rfl: 0   levothyroxine (SYNTHROID) 112 MCG tablet, TAKE 1 TABLET BY MOUTH EVERY DAY(NEED APPT FOR LAB), Disp: 90 tablet, Rfl: 2   lisinopril-hydrochlorothiazide (ZESTORETIC) 10-12.5 MG tablet, TAKE 1 TABLET BY MOUTH EVERY DAY, Disp: 90 tablet, Rfl: 1   meloxicam (MOBIC) 15 MG tablet, Take 15 mg by mouth daily., Disp: , Rfl:    metFORMIN (GLUCOPHAGE-XR) 500 MG 24 hr tablet, TAKE 2 TABLETS (1,000 MG TOTAL) BY MOUTH IN THE MORNING AND AT BEDTIME., Disp: 360 tablet, Rfl: 1   mupirocin ointment (BACTROBAN) 2 %, Apply 1 Application topically daily., Disp: 22 g, Rfl: 0   omeprazole (PRILOSEC) 10 MG capsule, Take 10 mg by mouth daily., Disp: , Rfl:    prednisoLONE acetate (PRED FORTE) 1 % ophthalmic suspension, INSTILL 1 DROP INTO LEFT EYE 4 TIMES A DAY, Disp: , Rfl:    prednisoLONE acetate (PRED FORTE) 1 % ophthalmic suspension, Apply to eye., Disp: , Rfl:    promethazine (PHENERGAN) 25 MG tablet, Take 1 tablet (25  mg total) by mouth every 8 (eight) hours as needed for nausea or vomiting., Disp: 30 tablet, Rfl: 0   rosuvastatin (CRESTOR) 10 MG tablet, TAKE 1 TABLET BY MOUTH EVERY DAY, Disp: 90 tablet, Rfl: 1   Medications ordered in this encounter:  Meds ordered this encounter  Medications   molnupiravir EUA (LAGEVRIO) 200 mg CAPS capsule    Sig: Take 4 capsules (800 mg total) by mouth 2 (two) times daily for 5 days.    Dispense:  40 capsule    Refill:  0    Order Specific Question:   Supervising Provider    Answer:   Chase Picket [[id number]]   fluticasone (FLONASE) 50  MCG/ACT nasal spray    Sig: Place 2 sprays into both nostrils daily.    Dispense:  16 g    Refill:  0    Order Specific Question:   Supervising Provider    Answer:   Chase Picket [id number]   promethazine-dextromethorphan (PROMETHAZINE-DM) 6.25-15 MG/5ML syrup    Sig: Take 5 mLs by mouth 4 (four) times daily as needed.    Dispense:  118 mL    Refill:  0    Order Specific Question:   Supervising Provider    Answer:   Chase Picket [[id number]]   benzonatate (TESSALON) 100 MG capsule    Sig: Take 1 capsule (100 mg total) by mouth 3 (three) times daily as needed.    Dispense:  30 capsule    Refill:  0    Order Specific Question:   Supervising Provider    Answer:   Chase Picket [id number]     *If you need refills on other medications prior to your next appointment, please contact your pharmacy*  Follow-Up: Call back or seek an in-person evaluation if the symptoms worsen or if the condition fails to improve as anticipated.  [place] [phone]  Other Instructions  Can take to lessen severity: Vit C '500mg'$  twice daily Quercertin 250-'500mg'$  twice daily Zinc 75-'100mg'$  daily Melatonin 3-6 mg at bedtime Vit D3 1000-2000 IU daily Aspirin 81 mg daily with food Optional: Famotidine '20mg'$  daily Also can add tylenol/ibuprofen as needed for fevers and body aches May add Mucinex or Mucinex DM as needed for cough/congestion   COVID-19 COVID-19, or coronavirus disease 2019, is an infection that is caused by a new (novel) coronavirus called SARS-CoV-2. COVID-19 can cause many symptoms. In some people, the virus may not cause any symptoms. In others, it may cause mild or severe symptoms. Some people with severe infection develop severe disease. What are the causes? This illness is caused by a virus. The virus may be in the air as tiny specks of fluid (aerosols) or droplets, or it may be on surfaces. You may catch the virus by: Breathing in droplets from an infected  person. Droplets can be spread by a person breathing, speaking, singing, coughing, or sneezing. Touching something, like a table or a doorknob, that has virus on it (is contaminated) and then touching your mouth, nose, or eyes. What increases the risk? Risk for infection: You are more likely to get infected with the COVID-19 virus if: You are within 6 ft (1.8 m) of a person with COVID-19 for 15 minutes or longer. You are providing care for a person who is infected with COVID-19. You are in close personal contact with other people. Close personal contact includes hugging, kissing, or sharing eating or drinking utensils. Risk for serious illness caused by  COVID-19: You are more likely to get seriously ill from the COVID-19 virus if: You have cancer. You have a long-term (chronic) disease, such as: Chronic lung disease. This includes pulmonary embolism, chronic obstructive pulmonary disease, and cystic fibrosis. Long-term disease that lowers your body's ability to fight infection (immunocompromise). Serious cardiac conditions, such as heart failure, coronary artery disease, or cardiomyopathy. Diabetes. Chronic kidney disease. Liver diseases. These include cirrhosis, nonalcoholic fatty liver disease, alcoholic liver disease, or autoimmune hepatitis. You have obesity. You are pregnant or were recently pregnant. You have sickle cell disease. What are the signs or symptoms? Symptoms of this condition can range from mild to severe. Symptoms may appear any time from 2 to 14 days after being exposed to the virus. They include: Fever or chills. Shortness of breath or trouble breathing. Feeling tired or very tired. Headaches, body aches, or muscle aches. Runny or stuffy nose, sneezing, coughing, or sore throat. New loss of taste or smell. This is rare. Some people may also have stomach problems, such as nausea, vomiting, or diarrhea. Other people may not have any symptoms of COVID-19. How is this  diagnosed? This condition may be diagnosed by testing samples to check for the COVID-19 virus. The most common tests are the PCR test and the antigen test. Tests may be done in the lab or at home. They include: Using a swab to take a sample of fluid from the back of your nose and throat (nasopharyngeal fluid), from your nose, or from your throat. Testing a sample of saliva from your mouth. Testing a sample of coughed-up mucus from your lungs (sputum). How is this treated? Treatment for COVID-19 infection depends on the severity of the condition. Mild symptoms can be managed at home with rest, fluids, and over-the-counter medicines. Serious symptoms may be treated in a hospital intensive care unit (ICU). Treatment in the ICU may include: Supplemental oxygen. Extra oxygen is given through a tube in the nose, a face mask, or a hood. Medicines. These may include: Antivirals, such as monoclonal antibodies. These help your body fight off certain viruses that can cause disease. Anti-inflammatories, such as corticosteroids. These reduce inflammation and suppress the immune system. Antithrombotics. These prevent or treat blood clots, if they develop. Convalescent plasma. This helps boost your immune system, if you have an underlying immunosuppressive condition or are getting immunosuppressive treatments. Prone positioning. This means you will lie on your stomach. This helps oxygen to get into your lungs. Infection control measures. If you are at risk for more serious illness caused by COVID-19, your health care provider may prescribe two long-acting monoclonal antibodies, given together every 6 months. How is this prevented? To protect yourself: Use preventive medicine (pre-exposure prophylaxis). You may get pre-exposure prophylaxis if you have moderate or severe immunocompromise. Get vaccinated. Anyone 58 months old or older who meets guidelines can get a COVID-19 vaccine or vaccine series. This includes  people who are pregnant or making breast milk (lactating). Get an added dose of COVID-19 vaccine after your first vaccine or vaccine series if you have moderate to severe immunocompromise. This applies if you have had a solid organ transplant or have been diagnosed with an immunocompromising condition. You should get the added dose 4 weeks after you got the first COVID-19 vaccine or vaccine series. If you get an mRNA vaccine, you will need a 3-dose primary series. If you get the J&J/Janssen vaccine, you will need a 2-dose primary series, with the second dose being an mRNA vaccine. Talk to  your health care provider about getting experimental monoclonal antibodies. This treatment is approved under emergency use authorization to prevent severe illness before or after being exposed to the COVID-19 virus. You may be given monoclonal antibodies if: You have moderate or severe immunocompromise. This includes treatments that lower your immune response. People with immunocompromise may not develop protection against COVID-19 when they are vaccinated. You cannot be vaccinated. You may not get a vaccine if you have a severe allergic reaction to the vaccine or its components. You are not fully vaccinated. You are in a facility where COVID-19 is present and: Are in close contact with a person who is infected with the COVID-19 virus. Are at high risk of being exposed to the COVID-19 virus. You are at risk of illness from new variants of the COVID-19 virus. To protect others: If you have symptoms of COVID-19, take steps to prevent the virus from spreading to others. Stay home. Leave your house only to get medical care. Do not use public transit, if possible. Do not travel while you are sick. Wash your hands often with soap and water for at least 20 seconds. If soap and water are not available, use alcohol-based hand sanitizer. Make sure that all people in your household wash their hands well and often. Cough or  sneeze into a tissue or your sleeve or elbow. Do not cough or sneeze into your hand or into the air. Where to find more information Centers for Disease Control and Prevention: CharmCourses.be World Health Organization: https://www.castaneda.info/ Get help right away if: You have trouble breathing. You have pain or pressure in your chest. You are confused. You have bluish lips and fingernails. You have trouble waking from sleep. You have symptoms that get worse. These symptoms may be an emergency. Get help right away. Call 911. Do not wait to see if the symptoms will go away. Do not drive yourself to the hospital. Summary COVID-19 is an infection that is caused by a new coronavirus. Sometimes, there are no symptoms. Other times, symptoms range from mild to severe. Some people with a severe COVID-19 infection develop severe disease. The virus that causes COVID-19 can spread from person to person through droplets or aerosols from breathing, speaking, singing, coughing, or sneezing. Mild symptoms of COVID-19 can be managed at home with rest, fluids, and over-the-counter medicines. This information is not intended to replace advice given to you by your health care provider. Make sure you discuss any questions you have with your health care provider. Document Revised: 05/17/2021 Document Reviewed: 05/19/2021 Elsevier Patient Education  [place].    If you have been instructed to have an in-person evaluation today at a local Urgent Care facility, please use the link below. It will take you to a list of all of our available [facility], including address, phone number and hours of operation. Please do not delay care.  [facility]  If you or a family member do not have a primary care provider, use the link below to schedule a visit and establish care. When you choose a [facility] primary care physician or advanced practice provider, you gain a  long-term partner in health. Find a Primary Care Provider  Learn more about [facility]'s in-office and virtual care options: [facility]

## 2022-06-14 NOTE — Progress Notes (Signed)
Virtual Visit Consent   AAMILAH AUGENSTEIN, you are scheduled for a virtual visit with a [facility] provider today. Just as with appointments in the office, your consent must be obtained to participate. Your consent will be active for this visit and any virtual visit you may have with one of our providers in the next 365 days. If you have a MyChart account, a copy of this consent can be sent to you electronically.  As this is a virtual visit, video technology does not allow for your provider to perform a traditional examination. This may limit your provider's ability to fully assess your condition. If your provider identifies any concerns that need to be evaluated in person or the need to arrange testing (such as labs, EKG, etc.), we will make arrangements to do so. Although advances in technology are sophisticated, we cannot ensure that it will always work on either your end or our end. If the connection with a video visit is poor, the visit may have to be switched to a telephone visit. With either a video or telephone visit, we are not always able to ensure that we have a secure connection.  By engaging in this virtual visit, you consent to the provision of healthcare and authorize for your insurance to be billed (if applicable) for the services provided during this visit. Depending on your insurance coverage, you may receive a charge related to this service.  I need to obtain your verbal consent now. Are you willing to proceed with your visit today? JENIAH KISHI has provided verbal consent on 06/14/2022 for a virtual visit (video or telephone). Mar Daring, PA-C  Date: 06/14/2022 8:16 AM  Virtual Visit via Video Note   I, Mar Daring, connected with  CINNAMON MORENCY  ([id number], [birth year]) on 06/14/22 at  8:00 AM EST by a video-enabled telemedicine application and verified that I am speaking with the correct person using two identifiers.  Location: Patient: Virtual Visit Location  Patient: Home Provider: Virtual Visit Location Provider: Home Office   I discussed the limitations of evaluation and management by telemedicine and the availability of in person appointments. The patient expressed understanding and agreed to proceed.    History of Present Illness: KRISTIAN HAZZARD is a 62 y.o. who identifies as a female who was assigned female at birth, and is being seen today for Covid 49.  HPI: URI  This is a new problem. Episode onset: Tested positive for Covid 19 on at home test yesterday, symptoms started Tuesday morning early. The problem has been gradually worsening. The maximum temperature recorded prior to her arrival was 100.4 - 100.9 F. The fever has been present for 1 to 2 days. Associated symptoms include congestion, coughing, headaches, nausea, rhinorrhea (and post nasal drainage), a sore throat and vomiting. Pertinent negatives include no diarrhea, ear pain, plugged ear sensation or sinus pain. Treatments tried: dayquil, mucinex, ibuprofen. The treatment provided no relief.     Problems:  Patient Active Problem List   Diagnosis Date Noted   Aortic atherosclerosis ([place]) 04/06/2021   Overweight with body mass index (BMI) of 27 to 27.9 in adult 02/10/2021   GERD (gastroesophageal reflux disease) 07/19/2020   Type 2 diabetes mellitus with hyperglycemia, without long-term current use of insulin ([place]) 02/12/2019   HLD (hyperlipidemia) 09/06/2017   Essential hypertension 03/31/2016   Acquired hypothyroidism 03/31/2016   Endometriosis 03/31/2016   Generalized anxiety disorder 03/31/2016    Allergies:  Allergies  Allergen Reactions  Aspirin Shortness Of Breath and Hives   Penicillins Hives   Medications:  Current Outpatient Medications:    benzonatate (TESSALON) 100 MG capsule, Take 1 capsule (100 mg total) by mouth 3 (three) times daily as needed., Disp: 30 capsule, Rfl: 0   fluticasone (FLONASE) 50 MCG/ACT nasal spray, Place 2 sprays into both nostrils  daily., Disp: 16 g, Rfl: 0   molnupiravir EUA (LAGEVRIO) 200 mg CAPS capsule, Take 4 capsules (800 mg total) by mouth 2 (two) times daily for 5 days., Disp: 40 capsule, Rfl: 0   promethazine-dextromethorphan (PROMETHAZINE-DM) 6.25-15 MG/5ML syrup, Take 5 mLs by mouth 4 (four) times daily as needed., Disp: 118 mL, Rfl: 0   ACCU-CHEK GUIDE test strip, USE AS INSTRUCTED, Disp: 100 strip, Rfl: 12   acetaminophen (TYLENOL) 500 MG tablet, Take 1,000 mg by mouth as needed for mild pain., Disp: , Rfl:    Ascorbic Acid (VITAMIN C PO), Take 1 tablet by mouth daily., Disp: , Rfl:    butalbital-acetaminophen-caffeine (FIORICET) 50-325-40 MG tablet, Take 1 tablet by mouth every 6 (six) hours as needed for headache., Disp: 20 tablet, Rfl: 0   Calcium Carbonate-Vitamin D3 600-400 MG-UNIT TABS, Take by mouth., Disp: , Rfl:    cyanocobalamin 2000 MCG tablet, Take 2,000 mcg by mouth 2 (two) times daily., Disp: , Rfl:    cyclobenzaprine (FLEXERIL) 5 MG tablet, Take 5 mg by mouth 3 (three) times daily as needed., Disp: , Rfl:    ezetimibe (ZETIA) 10 MG tablet, Take 1 tablet (10 mg total) by mouth daily., Disp: 90 tablet, Rfl: 1   gabapentin (NEURONTIN) 100 MG capsule, TAKE 1 CAPSULE BY MOUTH EVERYDAY AT BEDTIME, Disp: 90 capsule, Rfl: 1   glucose blood test strip, Check sugar BID as needed. DX E11.9, Disp: 100 each, Rfl: 6   hydrOXYzine (VISTARIL) 25 MG capsule, Take 1 capsule (25 mg total) by mouth daily as needed., Disp: 90 capsule, Rfl: 0   levothyroxine (SYNTHROID) 112 MCG tablet, TAKE 1 TABLET BY MOUTH EVERY DAY(NEED APPT FOR LAB), Disp: 90 tablet, Rfl: 2   lisinopril-hydrochlorothiazide (ZESTORETIC) 10-12.5 MG tablet, TAKE 1 TABLET BY MOUTH EVERY DAY, Disp: 90 tablet, Rfl: 1   meloxicam (MOBIC) 15 MG tablet, Take 15 mg by mouth daily., Disp: , Rfl:    metFORMIN (GLUCOPHAGE-XR) 500 MG 24 hr tablet, TAKE 2 TABLETS (1,000 MG TOTAL) BY MOUTH IN THE MORNING AND AT BEDTIME., Disp: 360 tablet, Rfl: 1   mupirocin  ointment (BACTROBAN) 2 %, Apply 1 Application topically daily., Disp: 22 g, Rfl: 0   omeprazole (PRILOSEC) 10 MG capsule, Take 10 mg by mouth daily., Disp: , Rfl:    prednisoLONE acetate (PRED FORTE) 1 % ophthalmic suspension, INSTILL 1 DROP INTO LEFT EYE 4 TIMES A DAY, Disp: , Rfl:    prednisoLONE acetate (PRED FORTE) 1 % ophthalmic suspension, Apply to eye., Disp: , Rfl:    promethazine (PHENERGAN) 25 MG tablet, Take 1 tablet (25 mg total) by mouth every 8 (eight) hours as needed for nausea or vomiting., Disp: 30 tablet, Rfl: 0   rosuvastatin (CRESTOR) 10 MG tablet, TAKE 1 TABLET BY MOUTH EVERY DAY, Disp: 90 tablet, Rfl: 1  Observations/Objective: Patient is well-developed, well-nourished in no acute distress.  Resting comfortably at home.  Head is normocephalic, atraumatic.  No labored breathing.  Speech is clear and coherent with logical content.  Patient is alert and oriented at baseline.    Assessment and Plan: 1. COVID-19 - molnupiravir EUA (LAGEVRIO) 200 mg CAPS capsule;  Take 4 capsules (800 mg total) by mouth 2 (two) times daily for 5 days.  Dispense: 40 capsule; Refill: 0 - fluticasone (FLONASE) 50 MCG/ACT nasal spray; Place 2 sprays into both nostrils daily.  Dispense: 16 g; Refill: 0 - promethazine-dextromethorphan (PROMETHAZINE-DM) 6.25-15 MG/5ML syrup; Take 5 mLs by mouth 4 (four) times daily as needed.  Dispense: 118 mL; Refill: 0 - benzonatate (TESSALON) 100 MG capsule; Take 1 capsule (100 mg total) by mouth 3 (three) times daily as needed.  Dispense: 30 capsule; Refill: 0 - MyChart COVID-19 home monitoring program; Future  - Continue OTC symptomatic management of choice - Will send OTC vitamins and supplement information through AVS - Molnupiravir prescribed - Fluticasone, Tessalon, and Promethazine DM prescribed  - Patient enrolled in MyChart symptom monitoring - Push fluids - Rest as needed - Discussed return precautions and when to seek in-person evaluation, sent  via AVS as well   Follow Up Instructions: I discussed the assessment and treatment plan with the patient. The patient was provided an opportunity to ask questions and all were answered. The patient agreed with the plan and demonstrated an understanding of the instructions.  A copy of instructions were sent to the patient via MyChart unless otherwise noted below.    The patient was advised to call back or seek an in-person evaluation if the symptoms worsen or if the condition fails to improve as anticipated.  Time:  I spent 12 minutes with the patient via telehealth technology discussing the above problems/concerns.    Mar Daring, PA-C

## 2022-06-15 ENCOUNTER — Telehealth: Payer: 59 | Admitting: Internal Medicine

## 2022-06-19 ENCOUNTER — Encounter: Payer: Self-pay | Admitting: Internal Medicine

## 2022-06-20 MED ORDER — METFORMIN HCL ER 500 MG PO TB24: 1000.0000 mg | ORAL_TABLET | Freq: Two times a day (BID) | ORAL | 0 refills | Status: DC

## 2022-06-20 NOTE — Telephone Encounter (Signed)
Refill  has been sent to your pharmacy.  Please let us know if you have trouble getting it filled.   Thank you,   -Mickel Baas

## 2022-07-04 ENCOUNTER — Telehealth: Payer: Self-pay | Admitting: *Deleted

## 2022-07-04 MED ORDER — NA SULFATE-K SULFATE-MG SULF 17.5-3.13-1.6 GM/177ML PO SOLN: 1.0000 | Freq: Once | ORAL | 0 refills | Status: AC

## 2022-07-04 NOTE — Telephone Encounter (Signed)
Patient left a voicemail, requesting for her prep solution to be sent to [facility]. Suprep was sent.  Also inform patient that she will need to stop metformin for 2 days before her procedure.  Patient verbalized understanding.

## 2022-07-10 ENCOUNTER — Ambulatory Visit
Admission: RE | Admit: 2022-07-10 | Discharge: 2022-07-10 | Disposition: A | Discharge status: Home / Self Care | Payer: 59 | Attending: Gastroenterology | Admitting: Gastroenterology

## 2022-07-10 ENCOUNTER — Ambulatory Visit: Payer: 59 | Admitting: Certified Registered"

## 2022-07-10 ENCOUNTER — Encounter
Admission: RE | Disposition: A | Discharge status: Home / Self Care | Payer: Self-pay | Source: Home / Self Care | Attending: Gastroenterology

## 2022-07-10 ENCOUNTER — Encounter: Payer: Self-pay | Admitting: Gastroenterology

## 2022-07-10 DIAGNOSIS — E1165 Type 2 diabetes mellitus with hyperglycemia: Secondary | ICD-10-CM | POA: Diagnosis not present

## 2022-07-10 DIAGNOSIS — D126 Benign neoplasm of colon, unspecified: Secondary | ICD-10-CM

## 2022-07-10 DIAGNOSIS — Z1211 Encounter for screening for malignant neoplasm of colon: Secondary | ICD-10-CM | POA: Diagnosis not present

## 2022-07-10 DIAGNOSIS — I10 Essential (primary) hypertension: Secondary | ICD-10-CM | POA: Insufficient documentation

## 2022-07-10 DIAGNOSIS — D122 Benign neoplasm of ascending colon: Secondary | ICD-10-CM | POA: Diagnosis not present

## 2022-07-10 DIAGNOSIS — Z8249 Family history of ischemic heart disease and other diseases of the circulatory system: Secondary | ICD-10-CM | POA: Diagnosis not present

## 2022-07-10 DIAGNOSIS — E039 Hypothyroidism, unspecified: Secondary | ICD-10-CM | POA: Diagnosis not present

## 2022-07-10 DIAGNOSIS — Z7984 Long term (current) use of oral hypoglycemic drugs: Secondary | ICD-10-CM | POA: Insufficient documentation

## 2022-07-10 DIAGNOSIS — J45909 Unspecified asthma, uncomplicated: Secondary | ICD-10-CM | POA: Insufficient documentation

## 2022-07-10 HISTORY — PX: COLONOSCOPY WITH PROPOFOL: SHX5780

## 2022-07-10 LAB — GLUCOSE, CAPILLARY: Glucose-Capillary: 291 mg/dL — ABNORMAL HIGH (ref 70–99)

## 2022-07-10 SURGERY — COLONOSCOPY WITH PROPOFOL
Anesthesia: General

## 2022-07-10 MED ORDER — STERILE WATER FOR IRRIGATION IR SOLN
Status: DC | PRN
  Administered 2022-07-10: 60 mL

## 2022-07-10 MED ORDER — SODIUM CHLORIDE 0.9 % IV SOLN
INTRAVENOUS | Status: DC
  Administered 2022-07-10: 1000 mL via INTRAVENOUS

## 2022-07-10 MED ORDER — GLYCOPYRROLATE 0.2 MG/ML IJ SOLN
INTRAMUSCULAR | Status: DC | PRN
  Administered 2022-07-10: .2 mg via INTRAVENOUS

## 2022-07-10 MED ORDER — LIDOCAINE HCL (CARDIAC) PF 100 MG/5ML IV SOSY
PREFILLED_SYRINGE | INTRAVENOUS | Status: DC | PRN
  Administered 2022-07-10: 100 mg via INTRAVENOUS

## 2022-07-10 MED ORDER — PROPOFOL 10 MG/ML IV BOLUS
INTRAVENOUS | Status: DC | PRN
  Administered 2022-07-10: 70 mg via INTRAVENOUS
  Administered 2022-07-10 (×2): 30 mg via INTRAVENOUS

## 2022-07-10 MED ORDER — ONDANSETRON HCL 4 MG/2ML IJ SOLN
INTRAMUSCULAR | Status: DC | PRN
  Administered 2022-07-10 (×2): 4 mg via INTRAVENOUS

## 2022-07-10 MED ORDER — DEXAMETHASONE SODIUM PHOSPHATE 10 MG/ML IJ SOLN
INTRAMUSCULAR | Status: DC | PRN
  Administered 2022-07-10: 10 mg via INTRAVENOUS

## 2022-07-10 MED ORDER — PROPOFOL 500 MG/50ML IV EMUL
INTRAVENOUS | Status: DC | PRN
  Administered 2022-07-10: 165 ug/kg/min via INTRAVENOUS

## 2022-07-10 NOTE — Anesthesia Postprocedure Evaluation (Signed)
Anesthesia Post Note  Patient: Mary Schaefer  Procedure(s) Performed: COLONOSCOPY WITH PROPOFOL  Patient location during evaluation: Endoscopy Anesthesia Type: General Level of consciousness: awake and alert Pain management: pain level controlled Vital Signs Assessment: post-procedure vital signs reviewed and stable Respiratory status: spontaneous breathing, nonlabored ventilation, respiratory function stable and patient connected to nasal cannula oxygen Cardiovascular status: blood pressure returned to baseline and stable Postop Assessment: no apparent nausea or vomiting Anesthetic complications: no  No notable events documented.   Last Vitals:  Vitals:   07/10/22 0748 07/10/22 0852  BP: (!) 136/91 112/78  Pulse: 97 93  Resp: 18 15  Temp: (!) 35.8 C (!) 35.8 C  SpO2: 99%     Last Pain:  Vitals:   07/10/22 0912  TempSrc:   PainSc: 0-No pain                 Dimas Millin

## 2022-07-10 NOTE — H&P (Signed)
Mary Bellows, MD [address], [place], [place], [place], [address] [address], [place], [place], [place], [address] Phone: [phone]  Fax: [phone]  Primary Care Physician:  Jearld Fenton, NP   Pre-Procedure History & Physical: HPI:  Mary Schaefer is a 62 y.o. female is here for an colonoscopy.   Past Medical History:  Diagnosis Date   Blood in stool    Childhood asthma    Diabetes mellitus without complication ([place])    Dysplastic nevus 03/28/2022   left lateral thigh, excised 04/26/22, margins free   Endometriosis 1997   Hypertension    Thyroid disease     Past Surgical History:  Procedure Laterality Date   CORNEAL TRANSPLANT     TUBAL LIGATION  1998    Prior to Admission medications   Medication Sig Start Date End Date Taking? Authorizing Provider  ACCU-CHEK GUIDE test strip USE AS INSTRUCTED 06/15/21  Yes Baity, Coralie Keens, NP  acetaminophen (TYLENOL) 500 MG tablet Take 1,000 mg by mouth as needed for mild pain.   Yes [provider]  Ascorbic Acid (VITAMIN C PO) Take 1 tablet by mouth daily.   Yes [provider]  Calcium Carbonate-Vitamin D3 600-400 MG-UNIT TABS Take by mouth.   Yes [provider]  cyanocobalamin 2000 MCG tablet Take 2,000 mcg by mouth 2 (two) times daily.   Yes [provider]  gabapentin (NEURONTIN) 100 MG capsule TAKE 1 CAPSULE BY MOUTH EVERYDAY AT BEDTIME 02/27/22  Yes Baity, Coralie Keens, NP  glucose blood test strip Check sugar BID as needed. DX E11.9 02/21/19  Yes Jearld Fenton, NP  hydrOXYzine (VISTARIL) 25 MG capsule Take 1 capsule (25 mg total) by mouth daily as needed. 01/13/22  Yes Jearld Fenton, NP  levothyroxine (SYNTHROID) 112 MCG tablet TAKE 1 TABLET BY MOUTH EVERY DAY(NEED APPT FOR LAB) 02/22/22  Yes Baity, Coralie Keens, NP  lisinopril-hydrochlorothiazide (ZESTORETIC) 10-12.5 MG tablet TAKE 1 TABLET BY MOUTH EVERY DAY 02/22/22  Yes Baity, Coralie Keens, NP  meloxicam (MOBIC) 15 MG tablet Take 15  mg by mouth daily. 09/25/21  Yes [provider]  metFORMIN (GLUCOPHAGE-XR) 500 MG 24 hr tablet Take 2 tablets (1,000 mg total) by mouth 2 (two) times daily with a meal. Please schedule an office visit before anymore refills. 06/20/22  Yes Jearld Fenton, NP  mupirocin ointment (BACTROBAN) 2 % Apply 1 Application topically daily. 04/26/22  Yes Moye, [place], MD  prednisoLONE acetate (PRED FORTE) 1 % ophthalmic suspension INSTILL 1 DROP INTO LEFT EYE 4 TIMES A DAY 10/02/18  Yes [provider]  prednisoLONE acetate (PRED FORTE) 1 % ophthalmic suspension Apply to eye. 06/03/21  Yes [provider]  rosuvastatin (CRESTOR) 10 MG tablet TAKE 1 TABLET BY MOUTH EVERY DAY 05/22/22  Yes Baity, Coralie Keens, NP  benzonatate (TESSALON) 100 MG capsule Take 1 capsule (100 mg total) by mouth 3 (three) times daily as needed. 06/14/22   Mar Daring, PA-C  butalbital-acetaminophen-caffeine (FIORICET) [phone] MG tablet Take 1 tablet by mouth every 6 (six) hours as needed for headache. 05/18/22   Jearld Fenton, NP  cyclobenzaprine (FLEXERIL) 5 MG tablet Take 5 mg by mouth 3 (three) times daily as needed. Patient not taking: Reported on 07/10/2022 01/24/22   [provider]  ezetimibe (ZETIA) 10 MG tablet Take 1 tablet (10 mg total) by mouth daily. Patient not taking: Reported on 07/10/2022 01/16/22   Jearld Fenton, NP  fluticasone Asencion Islam)  50 MCG/ACT nasal spray Place 2 sprays into both nostrils daily. 06/14/22   Mar Daring, PA-C  omeprazole (PRILOSEC) 10 MG capsule Take 10 mg by mouth daily. Patient not taking: Reported on 07/10/2022    [provider]  promethazine (PHENERGAN) 25 MG tablet Take 1 tablet (25 mg total) by mouth every 8 (eight) hours as needed for nausea or vomiting. 05/18/22   Jearld Fenton, NP  promethazine-dextromethorphan (PROMETHAZINE-DM) 6.25-15 MG/5ML syrup Take 5 mLs by mouth 4 (four) times daily as needed. 06/14/22   Mar Daring,  PA-C    Allergies as of 01/16/2022 - Review Complete 01/13/2022  Allergen Reaction Noted   Aspirin Shortness Of Breath and Hives 03/17/2016   Penicillins Hives     Family History  Problem Relation Age of Onset   Ovarian cancer Mother    Hyperlipidemia Mother    Alcohol abuse Father    Hyperlipidemia Father    Heart disease Father    Stroke Father    Hypertension Father    Diabetes Sister    Colon cancer Neg Hx    Esophageal cancer Neg Hx    Breast cancer Neg Hx     Social History   Socioeconomic History   Marital status: Married    Spouse name: Not on file   Number of children: Not on file   Years of education: Not on file   Highest education level: Not on file  Occupational History   Not on file  Tobacco Use   Smoking status: Never   Smokeless tobacco: Never  Vaping Use   Vaping Use: Never used  Substance and Sexual Activity   Alcohol use: Yes    Alcohol/week: 1.0 - 2.0 standard drink of alcohol    Types: 1 - 2 Glasses of wine per week    Comment: rare   Drug use: Not Currently   Sexual activity: Yes  Other Topics Concern   Not on file  Social History Narrative   ** Merged History Encounter **       Social Determinants of Health   Financial Resource Strain: Not on file  Food Insecurity: Not on file  Transportation Needs: Not on file  Physical Activity: Not on file  Stress: Not on file  Social Connections: Not on file  Intimate Partner Violence: Not on file    Review of Systems: See HPI, otherwise negative ROS  Physical Exam: BP (!) 136/91   Pulse 97   Temp (!) 96.5 F (35.8 C) (Temporal)   Resp 18   Ht '5\' 6"'$  (1.676 m)   Wt 75.9 kg   LMP 09/10/2016   SpO2 99%   BMI 27.01 kg/m  General:   Alert,  pleasant and cooperative in NAD Head:  Normocephalic and atraumatic. Neck:  Supple; no masses or thyromegaly. Lungs:  Clear throughout to auscultation, normal respiratory effort.    Heart:  +S1, +S2, Regular rate and rhythm, No edema. Abdomen:   Soft, nontender and nondistended. Normal bowel sounds, without guarding, and without rebound.   Neurologic:  Alert and  oriented x4;  grossly normal neurologically.  Impression/Plan: Mary Schaefer is here for an colonoscopy to be performed for Screening colonoscopy average risk   Risks, benefits, limitations, and alternatives regarding  colonoscopy have been reviewed with the patient.  Questions have been answered.  All parties agreeable.   Mary Bellows, MD  07/10/2022, 8:22 AM

## 2022-07-10 NOTE — Anesthesia Procedure Notes (Signed)
Procedure Name: General with mask airway Date/Time: 07/10/2022 8:40 AM  Performed by: Kelton Pillar, CRNAPre-anesthesia Checklist: Patient identified, Emergency Drugs available, Suction available and Patient being monitored Patient Re-evaluated:Patient Re-evaluated prior to induction Oxygen Delivery Method: Simple face mask Induction Type: IV induction Placement Confirmation: positive ETCO2, CO2 detector and breath sounds checked- equal and bilateral Dental Injury: Teeth and Oropharynx as per pre-operative assessment

## 2022-07-10 NOTE — Anesthesia Preprocedure Evaluation (Signed)
Anesthesia Evaluation  Patient identified by MRN, date of birth, ID band Patient awake    Reviewed: Allergy & Precautions, NPO status , Patient's Chart, lab work & pertinent test results  Airway Mallampati: III  TM Distance: >3 FB Neck ROM: full    Dental  (+) Chipped, Dental Advidsory Given   Pulmonary neg shortness of breath, asthma    Pulmonary exam normal        Cardiovascular hypertension, negative cardio ROS Normal cardiovascular exam     Neuro/Psych  PSYCHIATRIC DISORDERS Anxiety     negative neurological ROS     GI/Hepatic negative GI ROS, Neg liver ROS,,,  Endo/Other  diabetes, Poorly ControlledHypothyroidism    Renal/GU negative Renal ROS  negative genitourinary   Musculoskeletal   Abdominal   Peds  Hematology negative hematology ROS (+)   Anesthesia Other Findings Past Medical History: No date: Blood in stool No date: Childhood asthma No date: Diabetes mellitus without complication ([place]) 96/22/2979: Dysplastic nevus     Comment:  left lateral thigh, excised 04/26/22, margins free 1997: Endometriosis No date: Hypertension No date: Thyroid disease  Past Surgical History: No date: CORNEAL TRANSPLANT 1998: TUBAL LIGATION  BMI    Body Mass Index: 27.01 kg/m      Reproductive/Obstetrics negative OB ROS                             Anesthesia Physical Anesthesia Plan  ASA: 3  Anesthesia Plan: General   Post-op Pain Management: Minimal or no pain anticipated   Induction: Intravenous  PONV Risk Score and Plan: 3 and Propofol infusion, TIVA and Ondansetron  Airway Management Planned: Nasal Cannula  Additional Equipment: None  Intra-op Plan:   Post-operative Plan:   Informed Consent: I have reviewed the patients History and Physical, chart, labs and discussed the procedure including the risks, benefits and alternatives for the proposed anesthesia with the patient  or authorized representative who has indicated his/her understanding and acceptance.     Dental advisory given  Plan Discussed with: CRNA and Surgeon  Anesthesia Plan Comments: (Discussed risks of anesthesia with patient, including possibility of difficulty with spontaneous ventilation under anesthesia necessitating airway intervention, PONV, and rare risks such as cardiac or respiratory or neurological events, and allergic reactions. Discussed the role of CRNA in patient's perioperative care. Patient understands.)        Anesthesia Quick Evaluation

## 2022-07-10 NOTE — Op Note (Signed)
[facility] Gastroenterology Patient Name: Mary Schaefer Procedure Date: 07/10/2022 8:21 AM MRN: [id number] Account #: [id number] Date of Birth: [birth year] Admit Type: Outpatient Age: 62 Room: [facility] ENDO ROOM 1 Gender: Female Note Status: Finalized Instrument Name: Jasper Riling [id number] Procedure:             Colonoscopy Indications:           Screening for colorectal malignant neoplasm Providers:             Jonathon Bellows MD, MD Referring MD:          Jearld Fenton (Referring MD) Medicines:             Monitored Anesthesia Care Complications:         No immediate complications. Procedure:             Pre-Anesthesia Assessment:                        - Prior to the procedure, a History and Physical was                         performed, and patient medications, allergies and                         sensitivities were reviewed. The patient's tolerance                         of previous anesthesia was reviewed.                        - The risks and benefits of the procedure and the                         sedation options and risks were discussed with the                         patient. All questions were answered and informed                         consent was obtained.                        - ASA Grade Assessment: II - A patient with mild                         systemic disease.                        After obtaining informed consent, the colonoscope was                         passed under direct vision. Throughout the procedure,                         the patient's blood pressure, pulse, and oxygen                         saturations were monitored continuously. The                         Colonoscope was introduced  through the anus and                         advanced to the the cecum, identified by the                         appendiceal orifice. The colonoscopy was performed                         with ease. The patient tolerated the procedure well.                          The quality of the bowel preparation was excellent.                         The ileocecal valve, appendiceal orifice, and rectum                         were photographed. Findings:      The perianal and digital rectal examinations were normal.      Two sessile polyps were found in the ascending colon. The polyps were 5       to 6 mm in size. These polyps were removed with a cold snare. Resection       and retrieval were complete.      A 3 mm polyp was found in the ascending colon. The polyp was sessile.       The polyp was removed with a jumbo cold forceps. Resection and retrieval       were complete.      The exam was otherwise without abnormality on direct and retroflexion       views. Impression:            - Two 5 to 6 mm polyps in the ascending colon, removed                         with a cold snare. Resected and retrieved.                        - One 3 mm polyp in the ascending colon, removed with                         a jumbo cold forceps. Resected and retrieved.                        - The examination was otherwise normal on direct and                         retroflexion views. Recommendation:        - Discharge patient to home (with escort).                        - Resume previous diet.                        - Continue present medications.                        - Await pathology results.                        -  Repeat colonoscopy for surveillance based on                         pathology results. Procedure Code(s):     --- Professional ---                        [phone], Colonoscopy, flexible; with removal of                         tumor(s), polyp(s), or other lesion(s) by snare                         technique                        [id number], 59, Colonoscopy, flexible; with biopsy, single                         or multiple Diagnosis Code(s):     --- Professional ---                        D12.2, Benign neoplasm of ascending colon                         Z12.11, Encounter for screening for malignant neoplasm                         of colon CPT copyright 2022 American Medical Association. All rights reserved. The codes documented in this report are preliminary and upon coder review may  be revised to meet current compliance requirements. Jonathon Bellows, MD Jonathon Bellows MD, MD 07/10/2022 8:52:12 AM This report has been signed electronically. Number of Addenda: 0 Note Initiated On: 07/10/2022 8:21 AM Scope Withdrawal Time: 0 hours 12 minutes 53 seconds  Total Procedure Duration: 0 hours 16 minutes 28 seconds  Estimated Blood Loss:  Estimated blood loss: none.      [facility]

## 2022-07-10 NOTE — Transfer of Care (Signed)
Immediate Anesthesia Transfer of Care Note  Patient: Mary Schaefer  Procedure(s) Performed: COLONOSCOPY WITH PROPOFOL  Patient Location: Endoscopy Unit  Anesthesia Type:General  Level of Consciousness: drowsy and patient cooperative  Airway & Oxygen Therapy: Patient Spontanous Breathing and Patient connected to face mask oxygen  Post-op Assessment: Report given to RN and Post -op Vital signs reviewed and stable  Post vital signs: Reviewed and stable  Last Vitals:  Vitals Value Taken Time  BP 112/78 07/10/22 0852  Temp 35.8 C 07/10/22 0852  Pulse 90 07/10/22 0853  Resp 17 07/10/22 0855  SpO2 99 % 07/10/22 0853  Vitals shown include unvalidated device data.  Last Pain:  Vitals:   07/10/22 0852  TempSrc: Temporal  PainSc: 0-No pain         Complications: No notable events documented.

## 2022-07-11 ENCOUNTER — Encounter: Payer: Self-pay | Admitting: Gastroenterology

## 2022-07-11 LAB — SURGICAL PATHOLOGY

## 2022-07-12 ENCOUNTER — Encounter: Payer: Self-pay | Admitting: Gastroenterology

## 2022-07-20 ENCOUNTER — Other Ambulatory Visit: Payer: Self-pay | Admitting: Internal Medicine

## 2022-07-20 NOTE — Telephone Encounter (Signed)
Requested Prescriptions  Pending Prescriptions Disp Refills   hydrOXYzine (VISTARIL) 25 MG capsule [Pharmacy Med Name: HYDROXYZINE PAM 25 MG CAP] 90 capsule 1    Sig: TAKE 1 CAPSULE (25 MG TOTAL) BY MOUTH DAILY AS NEEDED.     Ear, Nose, and Throat:  Antihistamines 2 Passed - 07/20/2022  8:31 AM      Passed - Cr in normal range and within 360 days    Creat  Date Value Ref Range Status  01/13/2022 0.59 0.50 - 1.05 mg/dL Final   Creatinine,U  Date Value Ref Range Status  02/12/2019 75.8 mg/dL Final   Creatinine, Urine  Date Value Ref Range Status  01/13/2022 115 20 - 275 mg/dL Final         Passed - Valid encounter within last 12 months    Recent Outpatient Visits           2 months ago Tension headache   [facility] [place], Coralie Keens, NP   6 months ago Encounter for general adult medical examination with abnormal findings   [facility] [place], Coralie Keens, NP   1 year ago LLQ pain   [facility] [place], [place] W, NP   1 year ago Type 2 diabetes mellitus with hyperglycemia, without long-term current use of insulin [facility])   [facility] [place], Coralie Keens, NP   1 year ago Left hip pain   [facility] [place], Coralie Keens, [place]

## 2022-08-11 ENCOUNTER — Other Ambulatory Visit: Payer: Self-pay | Admitting: Internal Medicine

## 2022-08-11 DIAGNOSIS — Z780 Asymptomatic menopausal state: Secondary | ICD-10-CM

## 2022-08-16 ENCOUNTER — Other Ambulatory Visit: Payer: Self-pay

## 2022-08-28 ENCOUNTER — Other Ambulatory Visit: Payer: Self-pay | Admitting: Internal Medicine

## 2022-08-29 NOTE — Telephone Encounter (Signed)
Requested Prescriptions  Pending Prescriptions Disp Refills   gabapentin (NEURONTIN) 100 MG capsule [Pharmacy Med Name: GABAPENTIN 100 MG CAPSULE] 90 capsule 1    Sig: TAKE 1 CAPSULE BY MOUTH EVERYDAY AT BEDTIME     Neurology: Anticonvulsants - gabapentin Passed - 08/28/2022 10:28 PM      Passed - Cr in normal range and within 360 days    Creat  Date Value Ref Range Status  01/13/2022 0.59 0.50 - 1.05 mg/dL Final   Creatinine,U  Date Value Ref Range Status  02/12/2019 75.8 mg/dL Final   Creatinine, Urine  Date Value Ref Range Status  01/13/2022 115 20 - 275 mg/dL Final         Passed - Completed PHQ-2 or PHQ-9 in the last 360 days      Passed - Valid encounter within last 12 months    Recent Outpatient Visits           3 months ago Tension headache   [facility] [place], Coralie Keens, NP   7 months ago Encounter for general adult medical examination with abnormal findings   [facility] [place], Coralie Keens, NP   1 year ago LLQ pain   [facility] [place], Coralie Keens, NP   1 year ago Type 2 diabetes mellitus with hyperglycemia, without long-term current use of insulin [facility])   [facility] [place], [place] W, NP   1 year ago Left hip pain   [facility] [place], Coralie Keens, NP               ezetimibe (ZETIA) 10 MG tablet [Pharmacy Med Name: EZETIMIBE 10 MG TABLET] 90 tablet 1    Sig: TAKE 1 TABLET BY MOUTH EVERY DAY     Cardiovascular:  Antilipid - Sterol Transport Inhibitors Failed - 08/28/2022 10:28 PM      Failed - AST in normal range and within 360 days    AST  Date Value Ref Range Status  01/13/2022 37 (H) 10 - 35 U/L Final         Failed - ALT in normal range and within 360 days    ALT  Date Value Ref Range Status  01/13/2022 62 (H) 6 - 29 U/L Final         Failed - Lipid Panel in normal range within the last 12 months    Cholesterol  Date  Value Ref Range Status  01/13/2022 157 <200 mg/dL Final   LDL Cholesterol (Calc)  Date Value Ref Range Status  01/13/2022 83 mg/dL (calc) Final    Comment:    Reference range: <100 . Desirable range <100 mg/dL for primary prevention;   <70 mg/dL for patients with CHD or diabetic patients  with > or = 2 CHD risk factors. [name] LDL-C is now calculated using the Martin-Hopkins  calculation, which is a validated novel method providing  better accuracy than the Friedewald equation in the  estimation of LDL-C.  Cresenciano Genre et al. Annamaria Helling. [id number]): 2061-2068  (http://education.[employer].com/faq/FAQ164)    Direct LDL  Date Value Ref Range Status  07/15/2020 206.0 mg/dL Final    Comment:    Optimal:  <100 mg/dLNear or Above Optimal:  100-129 mg/dLBorderline High:  130-159 mg/dLHigh:  160-189 mg/dLVery High:  >190 mg/dL   HDL  Date Value Ref Range Status  01/13/2022 44 (L) > OR = 50 mg/dL Final   Triglycerides  Date Value Ref Range Status  01/13/2022 205 (H) <150 mg/dL Final    Comment:    . If a non-fasting specimen was collected, consider repeat triglyceride testing on a fasting specimen if clinically indicated.  Yates Decamp et al. J. of Clin. Lipidol. [id number]. [name]          Passed - Patient is not pregnant      Passed - Valid encounter within last 12 months    Recent Outpatient Visits           3 months ago Tension headache   [facility] [place], Coralie Keens, NP   7 months ago Encounter for general adult medical examination with abnormal findings   [facility] [place], Coralie Keens, NP   1 year ago LLQ pain   [facility] [place], [place] W, NP   1 year ago Type 2 diabetes mellitus with hyperglycemia, without long-term current use of insulin [facility])   [facility] [place], Coralie Keens, NP   1 year ago Left hip pain   [facility] [place], Coralie Keens,  [place]

## 2022-10-04 ENCOUNTER — Encounter: Payer: 59 | Admitting: Dermatology

## 2022-10-31 ENCOUNTER — Ambulatory Visit: Payer: BC Managed Care – PPO | Admitting: Dermatology

## 2022-10-31 VITALS — BP 122/76

## 2022-10-31 DIAGNOSIS — Z86018 Personal history of other benign neoplasm: Secondary | ICD-10-CM

## 2022-10-31 DIAGNOSIS — L814 Other melanin hyperpigmentation: Secondary | ICD-10-CM

## 2022-10-31 DIAGNOSIS — D229 Melanocytic nevi, unspecified: Secondary | ICD-10-CM

## 2022-10-31 DIAGNOSIS — Z1283 Encounter for screening for malignant neoplasm of skin: Secondary | ICD-10-CM

## 2022-10-31 DIAGNOSIS — D1801 Hemangioma of skin and subcutaneous tissue: Secondary | ICD-10-CM

## 2022-10-31 DIAGNOSIS — L821 Other seborrheic keratosis: Secondary | ICD-10-CM

## 2022-10-31 DIAGNOSIS — W908XXA Exposure to other nonionizing radiation, initial encounter: Secondary | ICD-10-CM

## 2022-10-31 DIAGNOSIS — L578 Other skin changes due to chronic exposure to nonionizing radiation: Secondary | ICD-10-CM

## 2022-10-31 DIAGNOSIS — X32XXXA Exposure to sunlight, initial encounter: Secondary | ICD-10-CM

## 2022-10-31 NOTE — Patient Instructions (Signed)
Recommend taking Heliocare sun protection supplement daily in sunny weather for additional sun protection. For maximum protection on the sunniest days, you can take up to 2 capsules of regular Heliocare OR take 1 capsule of Heliocare Ultra. For prolonged exposure (such as a full day in the sun), you can repeat your dose of the supplement 4 hours after your first dose. Heliocare can be purchased at [facility], at some [facility] or at [email or web address].    Melanoma ABCDEs  Melanoma is the most dangerous type of skin cancer, and is the leading cause of death from skin disease.  You are more likely to develop melanoma if you: Have light-colored skin, light-colored eyes, or red or blond hair Spend a lot of time in the sun Tan regularly, either outdoors or in a tanning bed Have had blistering sunburns, especially during childhood Have a close family member who has had a melanoma Have atypical moles or large birthmarks  Early detection of melanoma is key since treatment is typically straightforward and cure rates are extremely high if we catch it early.   The first sign of melanoma is often a change in a mole or a new dark spot.  The ABCDE system is a way of remembering the signs of melanoma.  A for asymmetry:  The two halves do not match. B for border:  The edges of the growth are irregular. C for color:  A mixture of colors are present instead of an even brown color. D for diameter:  Melanomas are usually (but not always) greater than 6mm - the size of a pencil eraser. E for evolution:  The spot keeps changing in size, shape, and color.  Please check your skin once per month between visits. You can use a small mirror in front and a large mirror behind you to keep an eye on the back side or your body.   If you see any new or changing lesions before your next follow-up, please call to schedule a visit.  Please continue daily skin protection including broad spectrum sunscreen SPF 30+ to  sun-exposed areas, reapplying every 2 hours as needed when you're outdoors.    Due to recent changes in healthcare laws, you may see results of your pathology and/or laboratory studies on MyChart before the doctors have had a chance to review them. We understand that in some cases there may be results that are confusing or concerning to you. Please understand that not all results are received at the same time and often the doctors may need to interpret multiple results in order to provide you with the best plan of care or course of treatment. Therefore, we ask that you please give us 2 business days to thoroughly review all your results before contacting the office for clarification. Should we see a critical lab result, you will be contacted sooner.   If You Need Anything After Your Visit  If you have any questions or concerns for your doctor, please call our main line at [phone] and press option 4 to reach your doctor's medical assistant. If no one answers, please leave a voicemail as directed and we will return your call as soon as possible. Messages left after 4 pm will be answered the following business day.   You may also send us a message via MyChart. We typically respond to MyChart messages within 1-2 business days.  For prescription refills, please ask your pharmacy to contact our office. Our fax number is [phone].  If you have   an urgent issue when the clinic is closed that cannot wait until the next business day, you can page your doctor at the number below.    Please note that while we do our best to be available for urgent issues outside of office hours, we are not available 24/7.   If you have an urgent issue and are unable to reach us, you may choose to seek medical care at your doctor's office, retail clinic, urgent care center, or emergency room.  If you have a medical emergency, please immediately call 911 or go to the emergency department.  Pager Numbers  - Dr.  Kowalski: [phone]  - Dr. Moye: [phone]  - Dr. Stewart: [phone]  In the event of inclement weather, please call our main line at [phone] for an update on the status of any delays or closures.  Dermatology Medication Tips: Please keep the boxes that topical medications come in in order to help keep track of the instructions about where and how to use these. Pharmacies typically print the medication instructions only on the boxes and not directly on the medication tubes.   If your medication is too expensive, please contact our office at [phone] option 4 or send us a message through MyChart.   We are unable to tell what your co-pay for medications will be in advance as this is different depending on your insurance coverage. However, we may be able to find a substitute medication at lower cost or fill out paperwork to get insurance to cover a needed medication.   If a prior authorization is required to get your medication covered by your insurance company, please allow us 1-2 business days to complete this process.  Drug prices often vary depending on where the prescription is filled and some pharmacies may offer cheaper prices.  The website www.goodrx.com contains coupons for medications through different pharmacies. The prices here do not account for what the cost may be with help from insurance (it may be cheaper with your insurance), but the website can give you the price if you did not use any insurance.  - You can print the associated coupon and take it with your prescription to the pharmacy.  - You may also stop by our office during regular business hours and pick up a GoodRx coupon card.  - If you need your prescription sent electronically to a different pharmacy, notify our office through [facility] MyChart or by phone at [phone] option 4.     

## 2022-10-31 NOTE — Progress Notes (Signed)
   Follow-Up Visit   Subjective  Mary Schaefer is a 62 y.o. female who presents for the following: Skin Cancer Screening and Full Body Skin Exam  The patient presents for Total-Body Skin Exam (TBSE) for skin cancer screening and mole check. The patient has spots, moles and lesions to be evaluated, some may be new or changing and the patient has concerns that these could be cancer.  Itchy spot at right breast. Hx dysplastic nevus.   The following portions of the chart were reviewed this encounter and updated as appropriate: medications, allergies, medical history  Review of Systems:  No other skin or systemic complaints except as noted in HPI or Assessment and Plan.  Objective  Well appearing patient in no apparent distress; mood and affect are within normal limits.  A full examination was performed including scalp, head, eyes, ears, nose, lips, neck, chest, axillae, abdomen, back, buttocks, bilateral upper extremities, bilateral lower extremities, hands, feet, fingers, toes, fingernails, and toenails. All findings within normal limits unless otherwise noted below.   Relevant physical exam findings are noted in the Assessment and Plan.    Assessment & Plan   LENTIGINES, SEBORRHEIC KERATOSES, HEMANGIOMAS - Benign normal skin lesions - Benign-appearing - Call for any changes  MELANOCYTIC NEVI - Tan-brown and/or pink-flesh-colored symmetric macules and papules - Benign appearing on exam today - Observation - Call clinic for new or changing moles - Recommend daily use of broad spectrum spf 30+ sunscreen to sun-exposed areas.   ACTINIC DAMAGE - Chronic condition, secondary to cumulative UV/sun exposure - diffuse scaly erythematous macules with underlying dyspigmentation - Recommend daily broad spectrum sunscreen SPF 30+ to sun-exposed areas, reapply every 2 hours as needed.  - Staying in the shade or wearing long sleeves, sun glasses (UVA+UVB protection) and wide brim hats (4-inch  brim around the entire circumference of the hat) are also recommended for sun protection.  - Call for new or changing lesions.  SKIN CANCER SCREENING PERFORMED TODAY.  History of Dysplastic Nevi - No evidence of recurrence today at left lateral thigh - Recommend regular full body skin exams - Recommend daily broad spectrum sunscreen SPF 30+ to sun-exposed areas, reapply every 2 hours as needed.  - Call if any new or changing lesions are noted between office visits    Return for TBSE, as scheduled, Hx Dysplastic Nevi.  Anise Salvo, RMA, am acting as scribe for Darden Dates, MD .   Documentation: I have reviewed the above documentation for accuracy and completeness, and I agree with the above.  Darden Dates, MD

## 2022-11-15 ENCOUNTER — Other Ambulatory Visit: Payer: Self-pay | Admitting: Internal Medicine

## 2022-11-15 DIAGNOSIS — E039 Hypothyroidism, unspecified: Secondary | ICD-10-CM

## 2022-11-15 NOTE — Telephone Encounter (Signed)
Requested medication (s) are due for refill today:   Yes  Requested medication (s) are on the active medication list:   Yes  Future visit scheduled:   Yes 6/21 with Rene Kocher   Last ordered: 02/22/2022 #90, 2 refills  Returned per note that labs are needed.  Has upcoming appt.   Provider to review for refills   Requested Prescriptions  Pending Prescriptions Disp Refills   levothyroxine (SYNTHROID) 112 MCG tablet [Pharmacy Med Name: LEVOTHYROXINE 112 MCG TABLET] 90 tablet 2    Sig: TAKE 1 TABLET BY MOUTH EVERY DAY(NEED APPT FOR LAB)     Endocrinology:  Hypothyroid Agents Passed - 11/15/2022  2:27 AM      Passed - TSH in normal range and within 360 days    TSH  Date Value Ref Range Status  01/13/2022 3.20 0.40 - 4.50 mIU/L Final         Passed - Valid encounter within last 12 months    Recent Outpatient Visits           6 months ago Tension headache   [place] [facility] [place], Salvadore Oxford, NP   10 months ago Encounter for general adult medical examination with abnormal findings   [place] [facility] [place], Salvadore Oxford, NP   1 year ago LLQ pain   [place] [facility] [place], [place] W, NP   1 year ago Type 2 diabetes mellitus with hyperglycemia, without long-term current use of insulin [facility])   [place] [facility] [place], Salvadore Oxford, NP   2 years ago Left hip pain   [place] [facility] [place], Salvadore Oxford, NP       Future Appointments             In 2 weeks [place], Salvadore Oxford, NP [place] [facility], [facility]

## 2022-11-25 ENCOUNTER — Other Ambulatory Visit: Payer: Self-pay | Admitting: Internal Medicine

## 2022-11-27 NOTE — Telephone Encounter (Signed)
Requested Prescriptions  Pending Prescriptions Disp Refills   rosuvastatin (CRESTOR) 10 MG tablet [Pharmacy Med Name: ROSUVASTATIN CALCIUM 10 MG TAB] 90 tablet 0    Sig: TAKE 1 TABLET BY MOUTH EVERY DAY     Cardiovascular:  Antilipid - Statins 2 Failed - 11/25/2022  8:55 AM      Failed - Lipid Panel in normal range within the last 12 months    Cholesterol  Date Value Ref Range Status  01/13/2022 157 <200 mg/dL Final   LDL Cholesterol (Calc)  Date Value Ref Range Status  01/13/2022 83 mg/dL (calc) Final    Comment:    Reference range: <100 . Desirable range <100 mg/dL for primary prevention;   <70 mg/dL for patients with CHD or diabetic patients  with > or = 2 CHD risk factors. [name] LDL-C is now calculated using the Martin-Hopkins  calculation, which is a validated novel method providing  better accuracy than the Friedewald equation in the  estimation of LDL-C.  Horald Pollen et al. Lenox Ahr. 1610;960(45): 2061-2068  (http://education.[employer].com/faq/FAQ164)    Direct LDL  Date Value Ref Range Status  07/15/2020 206.0 mg/dL Final    Comment:    Optimal:  <100 mg/dLNear or Above Optimal:  100-129 mg/dLBorderline High:  130-159 mg/dLHigh:  160-189 mg/dLVery High:  >190 mg/dL   HDL  Date Value Ref Range Status  01/13/2022 44 (L) > OR = 50 mg/dL Final   Triglycerides  Date Value Ref Range Status  01/13/2022 205 (H) <150 mg/dL Final    Comment:    . If a non-fasting specimen was collected, consider repeat triglyceride testing on a fasting specimen if clinically indicated.  Perry Mount et al. J. of Clin. Lipidol. 2015;9:129-169. [name]          Passed - Cr in normal range and within 360 days    Creat  Date Value Ref Range Status  01/13/2022 0.59 0.50 - 1.05 mg/dL Final   Creatinine,U  Date Value Ref Range Status  02/12/2019 75.8 mg/dL Final   Creatinine, Urine  Date Value Ref Range Status  01/13/2022 115 20 - 275 mg/dL Final         Passed - Patient is not pregnant       Passed - Valid encounter within last 12 months    Recent Outpatient Visits           6 months ago Tension headache   [place] [facility], Salvadore Oxford, NP   10 months ago Encounter for general adult medical examination with abnormal findings   [place] [facility] [place], Salvadore Oxford, NP   1 year ago LLQ pain   [place] [facility] [place], [place], NP   1 year ago Type 2 diabetes mellitus with hyperglycemia, without long-term current use of insulin [facility])   [place] [facility] [place], Salvadore Oxford, NP   2 years ago Left hip pain   [place] [facility] [place], Salvadore Oxford, NP       Future Appointments             In 4 days [place], Salvadore Oxford, NP [place] [facility], [facility]

## 2022-11-29 ENCOUNTER — Other Ambulatory Visit: Payer: Self-pay | Admitting: Internal Medicine

## 2022-11-29 DIAGNOSIS — E039 Hypothyroidism, unspecified: Secondary | ICD-10-CM

## 2022-12-01 ENCOUNTER — Encounter: Payer: Self-pay | Admitting: Internal Medicine

## 2022-12-01 ENCOUNTER — Ambulatory Visit: Payer: BC Managed Care – PPO | Admitting: Internal Medicine

## 2022-12-01 VITALS — BP 116/72 | HR 96 | Temp 96.8°F | Wt 167.0 lb

## 2022-12-01 DIAGNOSIS — K219 Gastro-esophageal reflux disease without esophagitis: Secondary | ICD-10-CM

## 2022-12-01 DIAGNOSIS — E663 Overweight: Secondary | ICD-10-CM

## 2022-12-01 DIAGNOSIS — F411 Generalized anxiety disorder: Secondary | ICD-10-CM

## 2022-12-01 DIAGNOSIS — I700 Atherosclerosis of aorta: Secondary | ICD-10-CM | POA: Diagnosis not present

## 2022-12-01 DIAGNOSIS — E1165 Type 2 diabetes mellitus with hyperglycemia: Secondary | ICD-10-CM

## 2022-12-01 DIAGNOSIS — I10 Essential (primary) hypertension: Secondary | ICD-10-CM

## 2022-12-01 DIAGNOSIS — E785 Hyperlipidemia, unspecified: Secondary | ICD-10-CM

## 2022-12-01 DIAGNOSIS — E1141 Type 2 diabetes mellitus with diabetic mononeuropathy: Secondary | ICD-10-CM

## 2022-12-01 DIAGNOSIS — E1169 Type 2 diabetes mellitus with other specified complication: Secondary | ICD-10-CM

## 2022-12-01 DIAGNOSIS — E039 Hypothyroidism, unspecified: Secondary | ICD-10-CM | POA: Diagnosis not present

## 2022-12-01 DIAGNOSIS — Z6826 Body mass index (BMI) 26.0-26.9, adult: Secondary | ICD-10-CM

## 2022-12-01 LAB — POCT GLYCOSYLATED HEMOGLOBIN (HGB A1C): Hemoglobin A1C: 10.7 % — AB (ref 4.0–5.6)

## 2022-12-01 LAB — HM DIABETES EYE EXAM

## 2022-12-01 MED ORDER — OZEMPIC (0.25 OR 0.5 MG/DOSE) 2 MG/3ML ~~LOC~~ SOPN: 0.5000 mg | PEN_INJECTOR | SUBCUTANEOUS | 0 refills | Status: DC

## 2022-12-01 MED ORDER — LISINOPRIL-HYDROCHLOROTHIAZIDE 10-12.5 MG PO TABS: 1.0000 | ORAL_TABLET | Freq: Every day | ORAL | 1 refills | Status: DC

## 2022-12-01 NOTE — Assessment & Plan Note (Signed)
Avoid foods that trigger reflux Encourage weight loss as this can help reduce reflux symptoms Continue Tums or omeprazole OTC as needed

## 2022-12-01 NOTE — Assessment & Plan Note (Signed)
POCT A1c 10.7 Urine microalbumin has been checked within the last year Continue metformin We will add Ozempic Encourage low-carb diet and exercise for weight loss Encouraged routine eye exam Encouraged routine foot exam

## 2022-12-01 NOTE — Assessment & Plan Note (Signed)
C-Met and lipid profile today Encouraged her to consume a low-fat diet Continue atorvastatin and ezetimibe She is allergic to aspirin

## 2022-12-01 NOTE — Progress Notes (Signed)
Subjective:    Patient ID: Mary Schaefer, female    DOB: 1960-08-27, 62 y.o.   MRN: [id number]  HPI  Patient presents to clinic today for 72-month follow-up of chronic conditions.  HTN: Her BP today is 116/72.  She is taking lisinopril HCT as prescribed.  There is no ECG on file.  HLD with Aortic Atherosclerosis: Her last LDL was 83, triglycerides 413, 01/2022.  She denies myalgias on rosuvastatin and ezetimibe.  She has an allergy to aspirin OTC.  She tries to consume low-fat diet.  GERD: Intermittent. Managed with Tums or omeprazole.  There is no upper GI on file.  GAD: Intermittent, managed with buspirone and hydroxyzine as needed.  She is not currently seeing a therapist.  She denies depression, SI/HI.  DM2 with Neuropathy: Her last A1c was 7.5%, 01/2022.  She is taking metformin  and gabapentin as prescribed.  She checks her feet routinely.  Her last eye exam was 06/2020.  Flu 04/2020.  Pneumovax 02/2019.  COVID Pfizer x 3.  Hypothyroidism: She denies any issues on her current dose of levothyroxine.  She does not follow with endocrinology.   Review of Systems     Past Medical History:  Diagnosis Date   Blood in stool    Childhood asthma    Diabetes mellitus without complication (HCC)    Dysplastic nevus 03/28/2022   left lateral thigh, excised 04/26/22, margins free   Endometriosis 1997   Hypertension    Thyroid disease     Current Outpatient Medications  Medication Sig Dispense Refill   ACCU-CHEK GUIDE test strip USE AS INSTRUCTED 100 strip 12   acetaminophen (TYLENOL) 500 MG tablet Take 1,000 mg by mouth as needed for mild pain.     Ascorbic Acid (VITAMIN C PO) Take 1 tablet by mouth daily.     benzonatate (TESSALON) 100 MG capsule Take 1 capsule (100 mg total) by mouth 3 (three) times daily as needed. 30 capsule 0   butalbital-acetaminophen-caffeine (FIORICET) 50-325-40 MG tablet Take 1 tablet by mouth every 6 (six) hours as needed for headache. 20 tablet 0   Calcium  Carbonate-Vitamin D3 600-400 MG-UNIT TABS Take by mouth.     cyanocobalamin 2000 MCG tablet Take 2,000 mcg by mouth 2 (two) times daily.     cyclobenzaprine (FLEXERIL) 5 MG tablet Take 5 mg by mouth 3 (three) times daily as needed.     ezetimibe (ZETIA) 10 MG tablet TAKE 1 TABLET BY MOUTH EVERY DAY 90 tablet 1   fluticasone (FLONASE) 50 MCG/ACT nasal spray Place 2 sprays into both nostrils daily. 16 g 0   gabapentin (NEURONTIN) 100 MG capsule TAKE 1 CAPSULE BY MOUTH EVERYDAY AT BEDTIME 90 capsule 1   glucose blood test strip Check sugar BID as needed. DX E11.9 100 each 6   hydrOXYzine (VISTARIL) 25 MG capsule TAKE 1 CAPSULE (25 MG TOTAL) BY MOUTH DAILY AS NEEDED. 90 capsule 1   levothyroxine (SYNTHROID) 112 MCG tablet TAKE 1 TABLET BY MOUTH EVERY DAY(NEED APPT FOR LAB) 90 tablet 0   lisinopril-hydrochlorothiazide (ZESTORETIC) 10-12.5 MG tablet TAKE 1 TABLET BY MOUTH EVERY DAY 90 tablet 1   meloxicam (MOBIC) 15 MG tablet Take 15 mg by mouth daily.     metFORMIN (GLUCOPHAGE-XR) 500 MG 24 hr tablet Take 2 tablets (1,000 mg total) by mouth 2 (two) times daily with a meal. Please schedule an office visit before anymore refills. 120 tablet 0   mupirocin ointment (BACTROBAN) 2 % Apply 1 Application topically  daily. 22 g 0   omeprazole (PRILOSEC) 10 MG capsule Take 10 mg by mouth daily.     prednisoLONE acetate (PRED FORTE) 1 % ophthalmic suspension INSTILL 1 DROP INTO LEFT EYE 4 TIMES A DAY     prednisoLONE acetate (PRED FORTE) 1 % ophthalmic suspension Apply to eye.     promethazine (PHENERGAN) 25 MG tablet Take 1 tablet (25 mg total) by mouth every 8 (eight) hours as needed for nausea or vomiting. 30 tablet 0   promethazine-dextromethorphan (PROMETHAZINE-DM) 6.25-15 MG/5ML syrup Take 5 mLs by mouth 4 (four) times daily as needed. 118 mL 0   rosuvastatin (CRESTOR) 10 MG tablet TAKE 1 TABLET BY MOUTH EVERY DAY 90 tablet 0   No current facility-administered medications for this visit.    Allergies   Allergen Reactions   Aspirin Shortness Of Breath and Hives   Penicillins Hives    Family History  Problem Relation Age of Onset   Ovarian cancer Mother    Hyperlipidemia Mother    Alcohol abuse Father    Hyperlipidemia Father    Heart disease Father    Stroke Father    Hypertension Father    Diabetes Sister    Colon cancer Neg Hx    Esophageal cancer Neg Hx    Breast cancer Neg Hx     Social History   Socioeconomic History   Marital status: Married    Spouse name: Not on file   Number of children: Not on file   Years of education: Not on file   Highest education level: Not on file  Occupational History   Not on file  Tobacco Use   Smoking status: Never   Smokeless tobacco: Never  Vaping Use   Vaping Use: Never used  Substance and Sexual Activity   Alcohol use: Yes    Alcohol/week: 1.0 - 2.0 standard drink of alcohol    Types: 1 - 2 Glasses of wine per week    Comment: rare   Drug use: Not Currently   Sexual activity: Yes  Other Topics Concern   Not on file  Social History Narrative   ** Merged History Encounter **       Social Determinants of Health   Financial Resource Strain: Not on file  Food Insecurity: Not on file  Transportation Needs: Not on file  Physical Activity: Not on file  Stress: Not on file  Social Connections: Not on file  Intimate Partner Violence: Not on file     Constitutional: Denies fever, malaise, fatigue, headache or abrupt weight changes.  HEENT: Denies eye pain, eye redness, ear pain, ringing in the ears, wax buildup, runny nose, nasal congestion, bloody nose, or sore throat. Respiratory: Denies difficulty breathing, shortness of breath, cough or sputum production.   Cardiovascular: Denies chest pain, chest tightness, palpitations or swelling in the hands or feet.  Gastrointestinal: Denies abdominal pain, bloating, constipation, diarrhea or blood in the stool.  GU: Denies urgency, frequency, pain with urination, burning  sensation, blood in urine, odor or discharge. Musculoskeletal: Denies decrease in range of motion, difficulty with gait, muscle pain or joint pain and swelling.  Skin: Denies redness, rashes, lesions or ulcercations.  Neurological: Denies dizziness, difficulty with memory, difficulty with speech or problems with balance and coordination.  Psych: Patient has a history of anxiety.  Denies depression, SI/HI.  No other specific complaints in a complete review of systems (except as listed in HPI above).  Objective:   Physical Exam   BP 116/72 (  BP Location: Right Arm, Patient Position: Sitting, Cuff Size: Normal)   Pulse 96   Temp (!) 96.8 F (36 C) (Temporal)   Wt 167 lb (75.8 kg)   LMP 09/10/2016   SpO2 97%   BMI 26.95 kg/m   Wt Readings from Last 3 Encounters:  07/10/22 167 lb 5.3 oz (75.9 kg)  05/18/22 170 lb (77.1 kg)  03/20/22 175 lb (79.4 kg)    General: Appears her stated age, overweight, in NAD. Skin: Warm, dry and intact. No ulcerations noted. HEENT: Head: normal shape and size; Eyes: sclera white, no icterus, conjunctiva pink, PERRLA and EOMs intact;   Neck:  Neck supple, trachea midline. No masses, lumps or thyromegaly present.  Cardiovascular: Normal rate and rhythm. S1,S2 noted.  No murmur, rubs or gallops noted. No JVD or BLE edema. No carotid bruits noted. Pulmonary/Chest: Normal effort and positive vesicular breath sounds. No respiratory distress. No wheezes, rales or ronchi noted.  Abdomen: Normal bowel sounds.  Musculoskeletal:  No difficulty with gait.  Neurological: Alert and oriented. Coordination normal.  Psychiatric: Mood and affect normal. Behavior is normal. Judgment and thought content normal.     BMET    Component Value Date/Time   NA 136 01/13/2022 1326   K 3.8 01/13/2022 1326   CL 97 (L) 01/13/2022 1326   CO2 27 01/13/2022 1326   GLUCOSE 141 (H) 01/13/2022 1326   BUN 14 01/13/2022 1326   CREATININE 0.59 01/13/2022 1326   CALCIUM 10.2  01/13/2022 1326   GFRNONAA >60 04/05/2021 1500   GFRAA >60 09/17/2017 0732    Lipid Panel     Component Value Date/Time   CHOL 157 01/13/2022 1326   TRIG 205 (H) 01/13/2022 1326   HDL 44 (L) 01/13/2022 1326   CHOLHDL 3.6 01/13/2022 1326   VLDL 43.2 (H) 09/02/2019 0809   LDLCALC 83 01/13/2022 1326    CBC    Component Value Date/Time   WBC 7.6 01/13/2022 1326   RBC 4.84 01/13/2022 1326   HGB 14.8 01/13/2022 1326   HCT 43.8 01/13/2022 1326   PLT 314 01/13/2022 1326   MCV 90.5 01/13/2022 1326   MCH 30.6 01/13/2022 1326   MCHC 33.8 01/13/2022 1326   RDW 12.7 01/13/2022 1326    Hgb A1C Lab Results  Component Value Date   HGBA1C 7.5 (H) 01/13/2022           Assessment & Plan:     RTC in 3 months for annual exam Nicki Reaper, NP

## 2022-12-01 NOTE — Assessment & Plan Note (Signed)
TSH and free T4 today We will levothyroxine if needed based on labs

## 2022-12-01 NOTE — Patient Instructions (Signed)

## 2022-12-01 NOTE — Assessment & Plan Note (Signed)
Encourage diet and exercise for weight loss 

## 2022-12-01 NOTE — Assessment & Plan Note (Signed)
Controlled on lisinopril HCT, refilled today Reinforced DASH diet and exercise for weight loss C-Met today 

## 2022-12-01 NOTE — Assessment & Plan Note (Addendum)
Stable on buspirone and hydroxyzine as needed Support offered

## 2022-12-01 NOTE — Assessment & Plan Note (Signed)
C-Met and lipid profile today Encouraged to consume a low-fat diet Continue rosuvastatin and ezetimibe

## 2022-12-02 LAB — LIPID PANEL
Cholesterol: 112 mg/dL (ref <200)
HDL: 38 mg/dL — ABNORMAL LOW (ref >=50)
LDL Cholesterol (Calc): 48 mg/dL (calc)
Non-HDL Cholesterol (Calc): 74 mg/dL (calc) (ref <130)
Total CHOL/HDL Ratio: 2.9 (calc) (ref <5.0)
Triglycerides: 187 mg/dL — ABNORMAL HIGH (ref <150)

## 2022-12-02 LAB — COMPLETE METABOLIC PANEL WITH GFR
AG Ratio: 2.5 (calc) (ref 1.0–2.5)
ALT: 35 U/L — ABNORMAL HIGH (ref 6–29)
AST: 26 U/L (ref 10–35)
Albumin: 4.9 g/dL (ref 3.6–5.1)
Alkaline phosphatase (APISO): 70 U/L (ref 37–153)
BUN: 14 mg/dL (ref 7–25)
CO2: 28 mmol/L (ref 20–32)
Calcium: 9.9 mg/dL (ref 8.6–10.4)
Chloride: 98 mmol/L (ref 98–110)
Creat: 0.65 mg/dL (ref 0.50–1.05)
Globulin: 2 g/dL (calc) (ref 1.9–3.7)
Glucose, Bld: 203 mg/dL — ABNORMAL HIGH (ref 65–99)
Potassium: 4.1 mmol/L (ref 3.5–5.3)
Sodium: 138 mmol/L (ref 135–146)
Total Bilirubin: 0.6 mg/dL (ref 0.2–1.2)
Total Protein: 6.9 g/dL (ref 6.1–8.1)
eGFR: 101 mL/min/{1.73_m2} (ref >=60)

## 2022-12-02 LAB — CBC
HCT: 47.6 % — ABNORMAL HIGH (ref 35.0–45.0)
Hemoglobin: 15.6 g/dL — ABNORMAL HIGH (ref 11.7–15.5)
MCH: 29.4 pg (ref 27.0–33.0)
MCHC: 32.8 g/dL (ref 32.0–36.0)
MCV: 89.6 fL (ref 80.0–100.0)
MPV: 9.6 fL (ref 7.5–12.5)
Platelets: 295 10*3/uL (ref 140–400)
RBC: 5.31 10*6/uL — ABNORMAL HIGH (ref 3.80–5.10)
RDW: 12.5 % (ref 11.0–15.0)
WBC: 6.8 10*3/uL (ref 3.8–10.8)

## 2022-12-02 LAB — T4, FREE: Free T4: 1.8 ng/dL (ref 0.8–1.8)

## 2022-12-02 LAB — TSH: TSH: 4.04 mIU/L (ref 0.40–4.50)

## 2022-12-03 ENCOUNTER — Encounter: Payer: Self-pay | Admitting: Family Medicine

## 2022-12-04 ENCOUNTER — Other Ambulatory Visit: Payer: Self-pay | Admitting: *Deleted

## 2022-12-04 MED ORDER — FLUCONAZOLE 150 MG PO TABS: 150.0000 mg | ORAL_TABLET | Freq: Once | ORAL | 3 refills | Status: AC

## 2022-12-04 NOTE — Telephone Encounter (Signed)
erroneous

## 2022-12-06 ENCOUNTER — Encounter: Payer: Self-pay | Admitting: Internal Medicine

## 2023-01-01 MED ORDER — METFORMIN HCL ER 500 MG PO TB24: 1000.0000 mg | ORAL_TABLET | Freq: Two times a day (BID) | ORAL | 3 refills | Status: DC

## 2023-01-01 NOTE — Addendum Note (Signed)
Addended by: Judd Gaudier on: 01/01/2023 01:06 PM   Modules accepted: Orders

## 2023-01-02 ENCOUNTER — Encounter: Payer: Self-pay | Admitting: Internal Medicine

## 2023-01-03 ENCOUNTER — Ambulatory Visit (INDEPENDENT_AMBULATORY_CARE_PROVIDER_SITE_OTHER): Payer: BC Managed Care – PPO | Admitting: Internal Medicine

## 2023-01-03 ENCOUNTER — Encounter: Payer: Self-pay | Admitting: Internal Medicine

## 2023-01-03 VITALS — BP 128/78 | HR 91 | Ht 66.0 in | Wt 165.0 lb

## 2023-01-03 DIAGNOSIS — K602 Anal fissure, unspecified: Secondary | ICD-10-CM

## 2023-01-03 DIAGNOSIS — E1141 Type 2 diabetes mellitus with diabetic mononeuropathy: Secondary | ICD-10-CM | POA: Diagnosis not present

## 2023-01-03 DIAGNOSIS — K644 Residual hemorrhoidal skin tags: Secondary | ICD-10-CM | POA: Diagnosis not present

## 2023-01-03 MED ORDER — ONETOUCH VERIO REFLECT W/DEVICE KIT: 1.0000 | PACK | Freq: Three times a day (TID) | 0 refills | Status: AC

## 2023-01-03 MED ORDER — ONETOUCH VERIO VI STRP: ORAL_STRIP | 3 refills | Status: AC

## 2023-01-03 MED ORDER — PROCTOFOAM HC 1-1 % EX FOAM: 1.0000 | Freq: Two times a day (BID) | CUTANEOUS | 1 refills | Status: AC

## 2023-01-03 MED ORDER — NITROGLYCERIN 0.4 % RE OINT: TOPICAL_OINTMENT | RECTAL | 0 refills | Status: AC

## 2023-01-03 MED ORDER — ONETOUCH ULTRASOFT LANCETS MISC: 12 refills | Status: AC

## 2023-01-03 NOTE — Patient Instructions (Signed)
Anal Fissure, Adult  An anal fissure is a small tear or crack in the tissue near the opening of the butt (anus). In most cases, bleeding from a fissure stops on its own within a few minutes. You may have bleeding each time you poop until the fissure heals. What are the causes? An anal fissure may be caused by large or hard poop (stool). Other causes include: Having trouble pooping (constipation). Getting diarrhea a lot. Inflammatory bowel disease, such as Crohn's disease or ulcerative colitis. Childbirth. Infections. Anal sex. What are the signs or symptoms? Symptoms of an anal fissure include: Bleeding from the rectum. Small amounts of blood seen on your poop, on the toilet paper, or in the toilet after you poop. The blood coats the outside of the poop. It is not mixed with the poop. Pain when you poop. Itching or irritation around the anus. How is this diagnosed? A health care provider may diagnose a fissure by looking closely at the anal area. In some cases, a rectal exam may be done or a short tube (anoscope) may be used to look at the anal canal. How is this treated? Treatment for an anal fissure may include: Taking steps to avoid and treat constipation. Taking fiber supplements. These can help soften your poop. Taking sitz baths. These can help heal the tear. Using medicated creams or ointments. Doing physical therapy. This can help strengthen the area between your hip bones (pelvis). If other treatments do not work, you may need: Botulinum injections. Surgery to fix the fissure. Follow these instructions at home: Medicines Take or use over-the-counter and prescription medicines only as told by your provider. This includes medicated creams and ointments. Use supplements and medicines to make your poop soft (stool softeners) as told by your provider. Managing constipation You may need to take these actions to prevent or treat constipation: Drink enough fluid to keep your pee  (urine) pale yellow. Eat foods that are high in fiber, such as beans, whole grains, and fresh fruits and vegetables. Avoid unripe bananas. Ripe bananas may help if you feel constipated. Limit foods that are high in fat and processed sugars, such as fried or sweet foods. Avoid dairy products, such as milk.  General instructions  Keep the anal area clean and dry. Take sitz baths as told by your provider. Do not use soap in the sitz baths. Contact a health care provider if: You have more bleeding. You have a fever. You have diarrhea that is mixed with blood. Your pain does not go away. Your problems get worse rather than better. This information is not intended to replace advice given to you by your health care provider. Make sure you discuss any questions you have with your health care provider. Document Revised: 06/15/2022 Document Reviewed: 06/15/2022 Elsevier Patient Education  2024 [employer].

## 2023-01-03 NOTE — Progress Notes (Signed)
Subjective:    Patient ID: Mary Schaefer, female    DOB: 04-02-1961, 62 y.o.   MRN: [id number]  HPI  Patient presents to clinic today with complaint of rectal pain and bleeding.  She noticed this about 1 week ago. She has had some constipation prior and thinks this may have been a contributing factor. She denies abdominal pain, bloating, or diarrhea. She has tried senekot, preperation h with some relief of symptoms.  She reports she has a history of anal fissures and hemorrhoids. She most recently saw Dr. Tobi Bastos for a colonoscopy 06/2022 but has seen Dr. Russella Dar back in 2020 for her anal fissure. She would like a referral back to Dr. Russella Dar.  She would also like me to send in a new meter so that she can check her blood sugars. Her last A1C was 10.7, 11/2022.   Review of Systems     Past Medical History:  Diagnosis Date   Blood in stool    Childhood asthma    Diabetes mellitus without complication (HCC)    Dysplastic nevus 03/28/2022   left lateral thigh, excised 04/26/22, margins free   Endometriosis 1997   Hypertension    Thyroid disease     Current Outpatient Medications  Medication Sig Dispense Refill   ACCU-CHEK GUIDE test strip USE AS INSTRUCTED 100 strip 12   acetaminophen (TYLENOL) 500 MG tablet Take 1,000 mg by mouth as needed for mild pain.     Ascorbic Acid (VITAMIN C PO) Take 1 tablet by mouth daily.     Calcium Carbonate-Vitamin D3 600-400 MG-UNIT TABS Take by mouth.     cyanocobalamin 2000 MCG tablet Take 2,000 mcg by mouth 2 (two) times daily.     ezetimibe (ZETIA) 10 MG tablet TAKE 1 TABLET BY MOUTH EVERY DAY 90 tablet 1   gabapentin (NEURONTIN) 100 MG capsule TAKE 1 CAPSULE BY MOUTH EVERYDAY AT BEDTIME 90 capsule 1   glucose blood test strip Check sugar BID as needed. DX E11.9 100 each 6   hydrOXYzine (VISTARIL) 25 MG capsule TAKE 1 CAPSULE (25 MG TOTAL) BY MOUTH DAILY AS NEEDED. 90 capsule 1   levothyroxine (SYNTHROID) 112 MCG tablet TAKE 1 TABLET BY MOUTH EVERY  DAY(NEED APPT FOR LAB) 90 tablet 0   lisinopril-hydrochlorothiazide (ZESTORETIC) 10-12.5 MG tablet Take 1 tablet by mouth daily. 90 tablet 1   metFORMIN (GLUCOPHAGE-XR) 500 MG 24 hr tablet Take 2 tablets (1,000 mg total) by mouth 2 (two) times daily with a meal. 120 tablet 3   omeprazole (PRILOSEC) 10 MG capsule Take 10 mg by mouth daily.     OZEMPIC, 0.25 OR 0.5 MG/DOSE, 2 MG/3ML SOPN Inject 0.5 mg into the skin once a week. Start with 0.25mg  weekly x 4 weeks then increase to 0.5mg  weekly injection. 9 mL 0   prednisoLONE acetate (PRED FORTE) 1 % ophthalmic suspension INSTILL 1 DROP INTO LEFT EYE 4 TIMES A DAY     promethazine (PHENERGAN) 25 MG tablet Take 1 tablet (25 mg total) by mouth every 8 (eight) hours as needed for nausea or vomiting. 30 tablet 0   rosuvastatin (CRESTOR) 10 MG tablet TAKE 1 TABLET BY MOUTH EVERY DAY 90 tablet 0   No current facility-administered medications for this visit.    Allergies  Allergen Reactions   Aspirin Shortness Of Breath and Hives   Penicillins Hives    Family History  Problem Relation Age of Onset   Ovarian cancer Mother    Hyperlipidemia Mother  Alcohol abuse Father    Hyperlipidemia Father    Heart disease Father    Stroke Father    Hypertension Father    Diabetes Sister    Colon cancer Neg Hx    Esophageal cancer Neg Hx    Breast cancer Neg Hx     Social History   Socioeconomic History   Marital status: Married    Spouse name: Not on file   Number of children: Not on file   Years of education: Not on file   Highest education level: Not on file  Occupational History   Not on file  Tobacco Use   Smoking status: Never   Smokeless tobacco: Never  Vaping Use   Vaping status: Never Used  Substance and Sexual Activity   Alcohol use: Yes    Alcohol/week: 1.0 - 2.0 standard drink of alcohol    Types: 1 - 2 Glasses of wine per week    Comment: rare   Drug use: Not Currently   Sexual activity: Yes  Other Topics Concern   Not on  file  Social History Narrative   ** Merged History Encounter **       Social Determinants of Health   Financial Resource Strain: Not on file  Food Insecurity: Not on file  Transportation Needs: Not on file  Physical Activity: Not on file  Stress: Not on file  Social Connections: Not on file  Intimate Partner Violence: Not on file     Constitutional: Denies fever, malaise, fatigue, headache or abrupt weight changes.  Respiratory: Denies difficulty breathing, shortness of breath, cough or sputum production.   Cardiovascular: Denies chest pain, chest tightness, palpitations or swelling in the hands or feet.  Gastrointestinal: Patient reports constipation, rectal pain and bleeding.  Denies abdominal pain, bloating, constipation, diarrhea.  GU: Denies urgency, frequency, pain with urination, burning sensation, blood in urine, odor or discharge.  No other specific complaints in a complete review of systems (except as listed in HPI above).  Objective:   Physical Exam   BP 128/78   Pulse 91   Ht 5\' 6"  (1.676 m)   Wt 165 lb (74.8 kg)   LMP 09/10/2016   SpO2 97%   BMI 26.63 kg/m   Wt Readings from Last 3 Encounters:  12/01/22 167 lb (75.8 kg)  07/10/22 167 lb 5.3 oz (75.9 kg)  05/18/22 170 lb (77.1 kg)    General: Appears her stated age, overweight, in NAD. Cardiovascular: Normal rate Pulmonary/Chest: Normal effort and positive vesicular breath sounds. No respiratory distress. No wheezes, rales or ronchi noted.  Abdomen: Normal bowel sounds.  Rectal: Anal fissure noted at 5 oclock. Hemorrhoid noted at 9 oclock. Neurological: Alert and oriented.    BMET    Component Value Date/Time   NA 138 12/01/2022 0833   K 4.1 12/01/2022 0833   CL 98 12/01/2022 0833   CO2 28 12/01/2022 0833   GLUCOSE 203 (H) 12/01/2022 0833   BUN 14 12/01/2022 0833   CREATININE 0.65 12/01/2022 0833   CALCIUM 9.9 12/01/2022 0833   GFRNONAA >60 04/05/2021 1500   GFRAA >60 09/17/2017 0732     Lipid Panel     Component Value Date/Time   CHOL 112 12/01/2022 0833   TRIG 187 (H) 12/01/2022 0833   HDL 38 (L) 12/01/2022 0833   CHOLHDL 2.9 12/01/2022 0833   VLDL 43.2 (H) 09/02/2019 0809   LDLCALC 48 12/01/2022 0833    CBC    Component Value Date/Time   WBC  6.8 12/01/2022 0833   RBC 5.31 (H) 12/01/2022 0833   HGB 15.6 (H) 12/01/2022 0833   HCT 47.6 (H) 12/01/2022 0833   PLT 295 12/01/2022 0833   MCV 89.6 12/01/2022 0833   MCH 29.4 12/01/2022 0833   MCHC 32.8 12/01/2022 0833   RDW 12.5 12/01/2022 0833    Hgb A1C Lab Results  Component Value Date   HGBA1C 10.7 (A) 12/01/2022           Assessment & Plan:   Anal fissure, external hemorrhoid:  Encouraged high-fiber diet and adequate water intake Continue senokot as needed for constipation Rx for nitroglycerin ointment twice daily x 4 weeks Rx for Proctofoam twice daily x 1 week Referral to GI for further evaluation and treatment  RTC in 2 months for your annual exam Nicki Reaper, NP

## 2023-01-03 NOTE — Assessment & Plan Note (Signed)
Encourage low-carb diet Rx for meter, strips and lancets

## 2023-01-11 ENCOUNTER — Encounter: Payer: Self-pay | Admitting: Internal Medicine

## 2023-01-11 MED ORDER — ONDANSETRON 4 MG PO TBDP: 4.0000 mg | ORAL_TABLET | Freq: Three times a day (TID) | ORAL | 0 refills | Status: DC | PRN

## 2023-01-24 ENCOUNTER — Encounter: Payer: Self-pay | Admitting: Internal Medicine

## 2023-01-30 ENCOUNTER — Other Ambulatory Visit: Payer: 59

## 2023-02-16 DIAGNOSIS — E119 Type 2 diabetes mellitus without complications: Secondary | ICD-10-CM | POA: Diagnosis not present

## 2023-02-16 DIAGNOSIS — H52213 Irregular astigmatism, bilateral: Secondary | ICD-10-CM | POA: Diagnosis not present

## 2023-02-24 ENCOUNTER — Ambulatory Visit
Admission: RE | Admit: 2023-02-24 | Discharge: 2023-02-24 | Disposition: A | Discharge status: Home / Self Care | Payer: BC Managed Care – PPO | Source: Ambulatory Visit | Attending: Family Medicine

## 2023-02-24 VITALS — BP 107/70 | HR 98 | Temp 98.7°F | Resp 16

## 2023-02-24 DIAGNOSIS — E11622 Type 2 diabetes mellitus with other skin ulcer: Secondary | ICD-10-CM

## 2023-02-24 DIAGNOSIS — L089 Local infection of the skin and subcutaneous tissue, unspecified: Secondary | ICD-10-CM | POA: Diagnosis not present

## 2023-02-24 DIAGNOSIS — E11628 Type 2 diabetes mellitus with other skin complications: Secondary | ICD-10-CM | POA: Diagnosis not present

## 2023-02-24 MED ORDER — SULFAMETHOXAZOLE-TRIMETHOPRIM 800-160 MG PO TABS: 1.0000 | ORAL_TABLET | Freq: Two times a day (BID) | ORAL | 0 refills | Status: AC

## 2023-02-24 NOTE — ED Triage Notes (Signed)
Pt states she was walking a lot 4 days ago and got a blister on her left heel and now is it red and painful per patient. Pt putting neosporin with Band-Aid on blister with no relief.

## 2023-02-24 NOTE — ED Provider Notes (Signed)
Mary Schaefer    CSN: [id number] Arrival date & time: 02/24/23  0801      History   Chief Complaint Chief Complaint  Patient presents with   Blister    Very painful on foot. - Entered by patient    HPI [name] is a 62 y.o. female.   HPI Patient here today for evaluation of a broken blister in the left lateral lower foot.  She reports wearing shoes that rubbed against the side of her foot causing a blister however blister erupted and wound has been present for 3 days.  Patient has been putting mupirocin ointment on the wound 3 times daily.  He is concerned as she is a diabetic.  On chart review her last A1c was 10.7.  Denies any loss of sensation to foot however endorses significant pain at the site of wound.  Past Medical History:  Diagnosis Date   Blood in stool    Childhood asthma    Diabetes mellitus without complication (HCC)    Dysplastic nevus 03/28/2022   left lateral thigh, excised 04/26/22, margins free   Endometriosis 1997   Hypertension    Thyroid disease     Patient Active Problem List   Diagnosis Date Noted   Aortic atherosclerosis (HCC) 04/06/2021   Overweight with body mass index (BMI) of 26 to 26.9 in adult 02/10/2021   GERD (gastroesophageal reflux disease) 07/19/2020   DM (diabetes mellitus), type 2 (HCC) 02/12/2019   Hyperlipidemia associated with type 2 diabetes mellitus (HCC) 09/06/2017   Essential hypertension 03/31/2016   Acquired hypothyroidism 03/31/2016   Generalized anxiety disorder 03/31/2016    Past Surgical History:  Procedure Laterality Date   COLONOSCOPY WITH PROPOFOL N/A 07/10/2022   Procedure: COLONOSCOPY WITH PROPOFOL;  Surgeon: Wyline Mood, MD;  Location: [facility] ENDOSCOPY;  Service: Gastroenterology;  Laterality: N/A;   CORNEAL TRANSPLANT     TUBAL LIGATION  1998    OB History     Gravida  4   Para  3   Term  3   Preterm  0   AB  1   Living  3      SAB  1   IAB  0   Ectopic  0   Multiple       Live Births  3            Home Medications    Prior to Admission medications   Medication Sig Start Date End Date Taking? Authorizing Provider  acetaminophen (TYLENOL) 500 MG tablet Take 1,000 mg by mouth as needed for mild pain.   Yes [provider]  Ascorbic Acid (VITAMIN C PO) Take 1 tablet by mouth daily.   Yes [provider]  Blood Glucose Monitoring Suppl (ONETOUCH VERIO REFLECT) w/Device KIT 1 Device by Does not apply route 3 (three) times daily. 01/03/23  Yes Lorre Munroe, NP  Calcium Carbonate-Vitamin D3 600-400 MG-UNIT TABS Take by mouth.   Yes [provider]  ezetimibe (ZETIA) 10 MG tablet TAKE 1 TABLET BY MOUTH EVERY DAY 08/29/22  Yes Baity, Salvadore Oxford, NP  gabapentin (NEURONTIN) 100 MG capsule TAKE 1 CAPSULE BY MOUTH EVERYDAY AT BEDTIME 08/29/22  Yes Baity, Salvadore Oxford, NP  glucose blood (ONETOUCH VERIO) test strip Check blood sugar 6 x daily. DX E11.9 01/03/23  Yes Lorre Munroe, NP  Lancets [facility] ULTRASOFT) lancets Use as instructed 01/03/23  Yes Lorre Munroe, NP  levothyroxine (SYNTHROID) 112 MCG tablet TAKE 1 TABLET BY  MOUTH EVERY DAY(NEED APPT FOR LAB) 11/16/22  Yes Baity, Salvadore Oxford, NP  lisinopril-hydrochlorothiazide (ZESTORETIC) 10-12.5 MG tablet Take 1 tablet by mouth daily. 12/01/22  Yes Lorre Munroe, NP  metFORMIN (GLUCOPHAGE-XR) 500 MG 24 hr tablet Take 2 tablets (1,000 mg total) by mouth 2 (two) times daily with a meal. 01/01/23  Yes Baity, Salvadore Oxford, NP  OZEMPIC, 0.25 OR 0.5 MG/DOSE, 2 MG/3ML SOPN Inject 0.5 mg into the skin once a week. Start with 0.25mg  weekly x 4 weeks then increase to 0.5mg  weekly injection. 12/01/22  Yes Baity, Salvadore Oxford, NP  prednisoLONE acetate (PRED FORTE) 1 % ophthalmic suspension INSTILL 1 DROP INTO LEFT EYE 4 TIMES A DAY 10/02/18  Yes [provider]  rosuvastatin (CRESTOR) 10 MG tablet TAKE 1 TABLET BY MOUTH EVERY DAY 11/27/22  Yes Baity, Salvadore Oxford, NP  sulfamethoxazole-trimethoprim (BACTRIM DS)  800-160 MG tablet Take 1 tablet by mouth 2 (two) times daily for 7 days. 02/24/23 03/03/23 Yes Bing Neighbors, NP  cyanocobalamin 2000 MCG tablet Take 2,000 mcg by mouth 2 (two) times daily.    [provider]  hydrocortisone-pramoxine (PROCTOFOAM HC) rectal foam Place 1 applicator rectally 2 (two) times daily. 01/03/23   Lorre Munroe, NP  hydrOXYzine (VISTARIL) 25 MG capsule TAKE 1 CAPSULE (25 MG TOTAL) BY MOUTH DAILY AS NEEDED. 07/20/22   Lorre Munroe, NP  Nitroglycerin 0.4 % OINT Apply 1 inch (375 mg) ointment intra-anally every 12 hours for anal fissure 01/03/23   Lorre Munroe, NP  omeprazole (PRILOSEC) 10 MG capsule Take 10 mg by mouth daily.    [provider]  ondansetron (ZOFRAN-ODT) 4 MG disintegrating tablet Take 1 tablet (4 mg total) by mouth every 8 (eight) hours as needed for nausea or vomiting. 01/11/23   Lorre Munroe, NP  promethazine (PHENERGAN) 25 MG tablet Take 1 tablet (25 mg total) by mouth every 8 (eight) hours as needed for nausea or vomiting. 05/18/22   Lorre Munroe, NP    Family History Family History  Problem Relation Age of Onset   Ovarian cancer Mother    Hyperlipidemia Mother    Alcohol abuse Father    Hyperlipidemia Father    Heart disease Father    Stroke Father    Hypertension Father    Diabetes Sister    Colon cancer Neg Hx    Esophageal cancer Neg Hx    Breast cancer Neg Hx     Social History Social History   Tobacco Use   Smoking status: Never   Smokeless tobacco: Never  Vaping Use   Vaping status: Never Used  Substance Use Topics   Alcohol use: Yes    Alcohol/week: 1.0 - 2.0 standard drink of alcohol    Types: 1 - 2 Glasses of wine per week    Comment: rare   Drug use: Not Currently     Allergies   Aspirin and Penicillins   Review of Systems Review of Systems Pertinent negatives listed in HPI   Physical Exam Triage Vital Signs ED Triage Vitals  Encounter Vitals Group     BP 02/24/23 0813 107/70      Systolic BP Percentile --      Diastolic BP Percentile --      Pulse Rate 02/24/23 0813 98     Resp 02/24/23 0813 16     Temp 02/24/23 0812 98.7 F (37.1 C)     Temp Source 02/24/23 0812 Oral     SpO2 02/24/23  0813 98 %     Weight --      Height --      Head Circumference --      Peak Flow --      Pain Score 02/24/23 0813 5     Pain Loc --      Pain Education --      Exclude from Growth Chart --    No data found.  Updated Vital Signs BP 107/70 (BP Location: Right Arm)   Pulse 98   Temp 98.7 F (37.1 C) (Oral)   Resp 16   LMP 09/10/2016   SpO2 98%   Visual Acuity Right Eye Distance:   Left Eye Distance:   Bilateral Distance:    Right Eye Near:   Left Eye Near:    Bilateral Near:     Physical Exam Constitutional:      Appearance: Normal appearance.  HENT:     Head: Normocephalic.  Eyes:     Extraocular Movements: Extraocular movements intact.     Pupils: Pupils are equal, round, and reactive to light.  Cardiovascular:     Rate and Rhythm: Normal rate.  Pulmonary:     Effort: Pulmonary effort is normal.     Breath sounds: Normal breath sounds.  Musculoskeletal:     Cervical back: Normal range of motion.       Legs:     Comments: 3 mm open wound which appears to be that of an abrasion depicted pictograph   Skin:    General: Skin is dry.  Neurological:     General: No focal deficit present.     Mental Status: She is alert.      UC Treatments / Results  Labs (all labs ordered are listed, but only abnormal results are displayed) Labs Reviewed - No data to display  EKG   Radiology No results found.  Procedures Procedures (including critical care time)  Medications Ordered in UC Medications - No data to display  Initial Impression / Assessment and Plan / UC Course  I have reviewed the triage vital signs and the nursing notes.  Pertinent labs & imaging results that were available during my care of the patient were reviewed by me and considered  in my medical decision making (see chart for details).    Diabetic foot infection, treatment per discharge medication orders. Advised to monitor for signs of worsening infection and to follow-up if  wound is not improving within 5 days.  Continue mupirocin ointment. Patient verbalized understanding and agreement with plan. Final Clinical Impressions(s) / UC Diagnoses   Final diagnoses:  Diabetic foot infection [facility])     Discharge Instructions      If wound doesn't appear to be improving within 5 days return for evaluation or follow-up with PCP.   ED Prescriptions     Medication Sig Dispense Auth. Provider   sulfamethoxazole-trimethoprim (BACTRIM DS) 800-160 MG tablet Take 1 tablet by mouth 2 (two) times daily for 7 days. 14 tablet Bing Neighbors, NP      PDMP not reviewed this encounter.   Bing Neighbors, NP 02/24/23 [phone]

## 2023-02-24 NOTE — Discharge Instructions (Signed)
If wound doesn't appear to be improving within 5 days return for evaluation or follow-up with PCP.

## 2023-02-25 IMAGING — MG MM DIGITAL SCREENING BILAT W/ TOMO AND CAD
6 of 10 series · 6 of 30 positions shown · non-contrast
Comparison: Previous exam(s).

CLINICAL DATA: Screening.

EXAM:
DIGITAL SCREENING BILATERAL MAMMOGRAM WITH TOMOSYNTHESIS AND CAD
TECHNIQUE: Bilateral screening digital craniocaudal and mediolateral oblique
mammograms were obtained. Bilateral screening digital breast
tomosynthesis was performed. The images were evaluated with
computer-aided detection.

[R MLO synth-2D (1 of 2)]
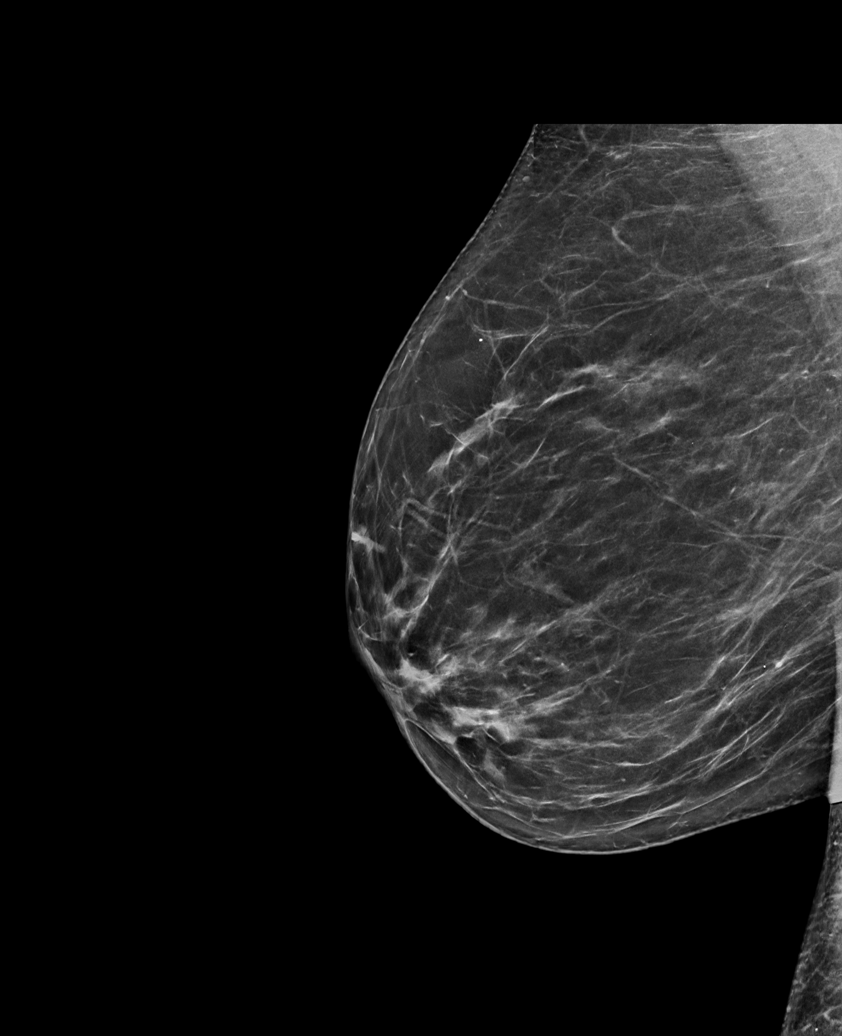

[R MLO synth-2D (2 of 2)]
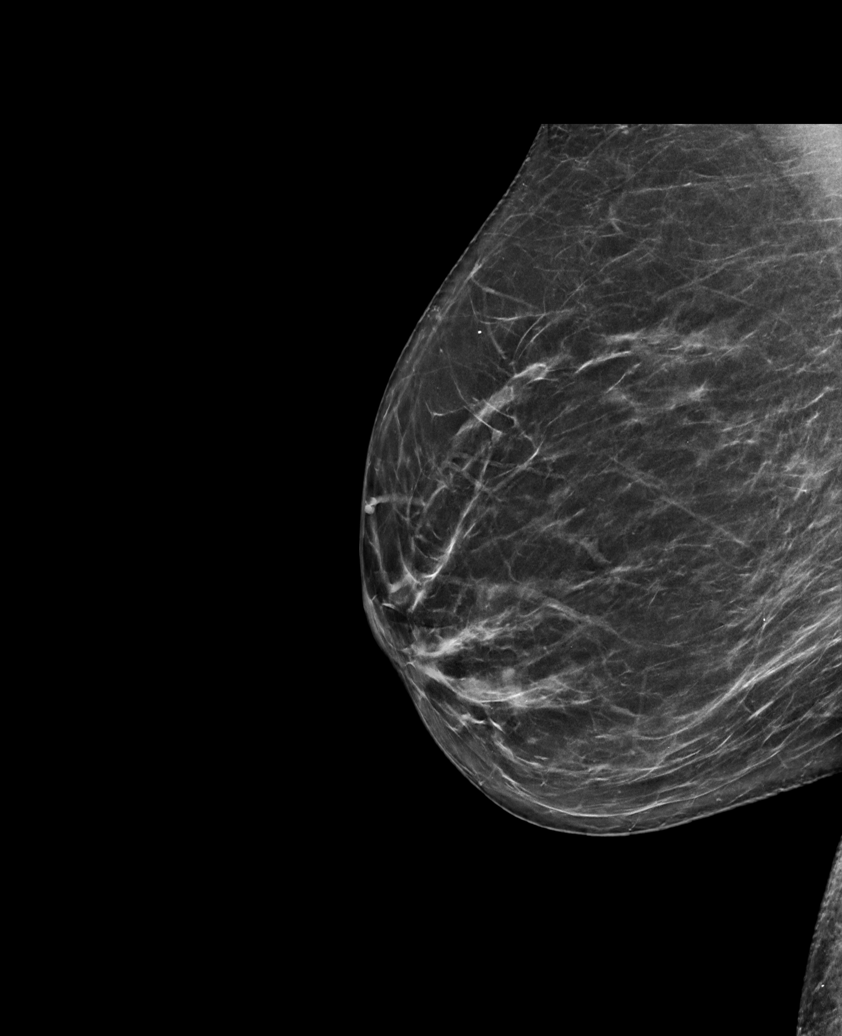

[R CC synth-2D]
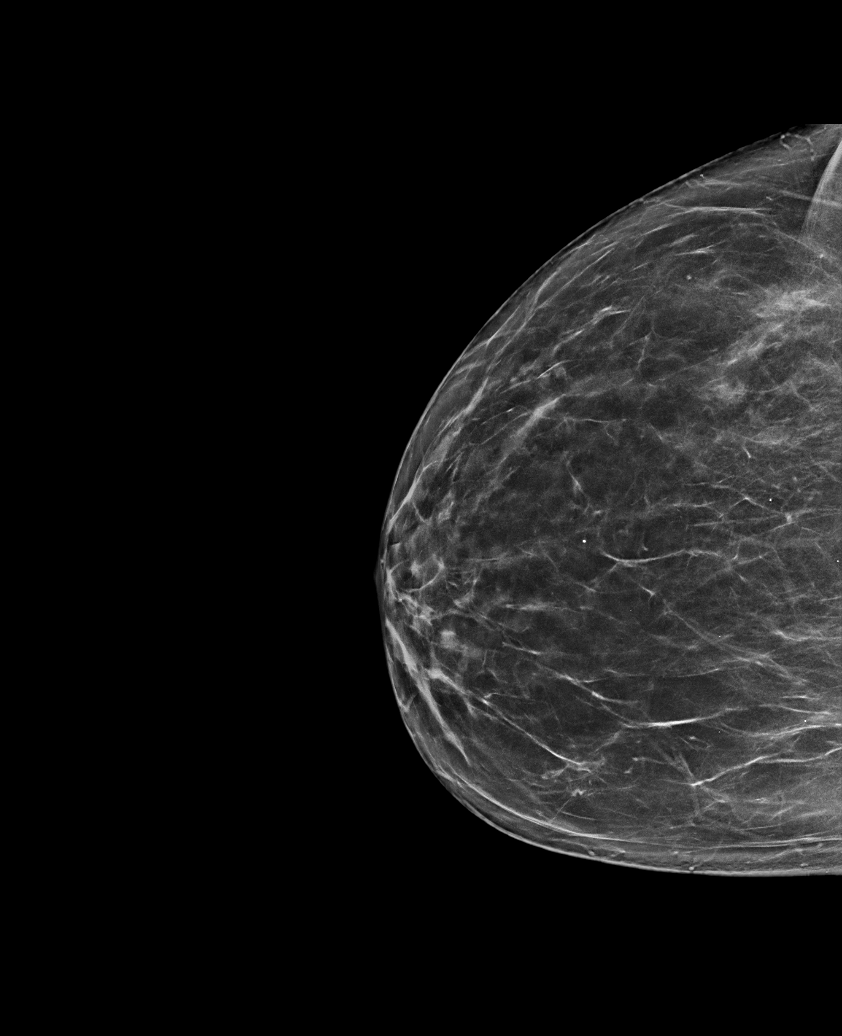

[L MLO synth-2D]
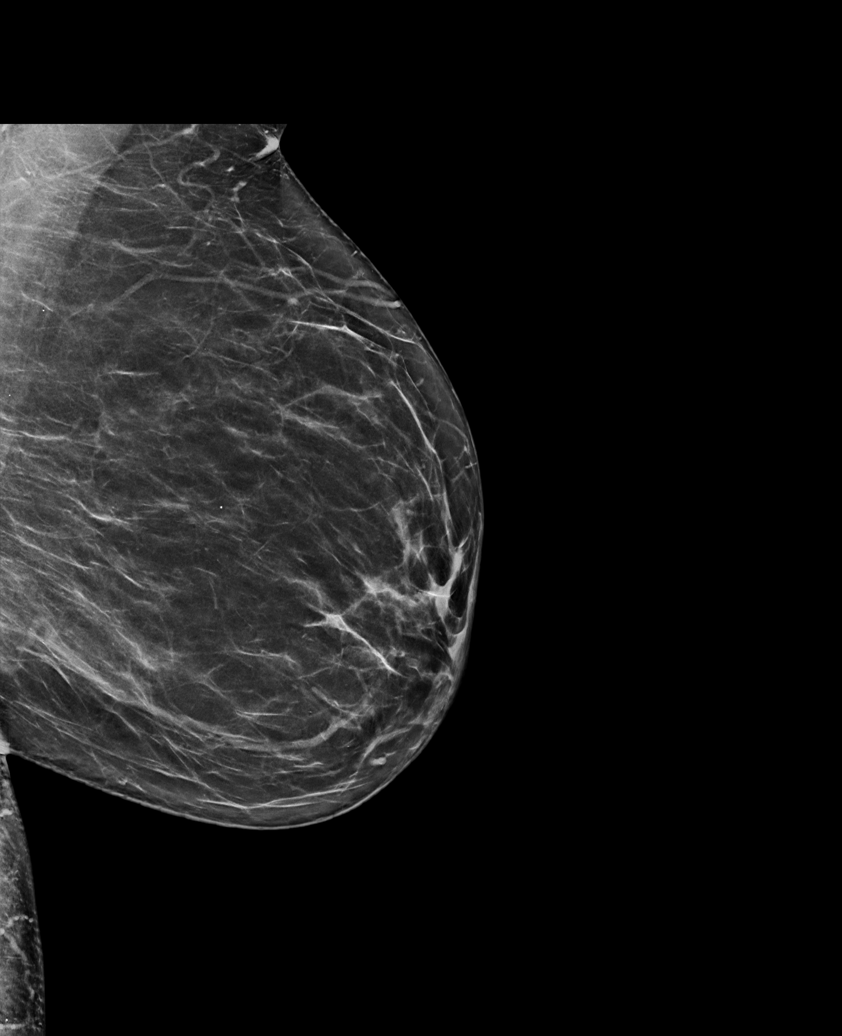

[L CC synth-2D]
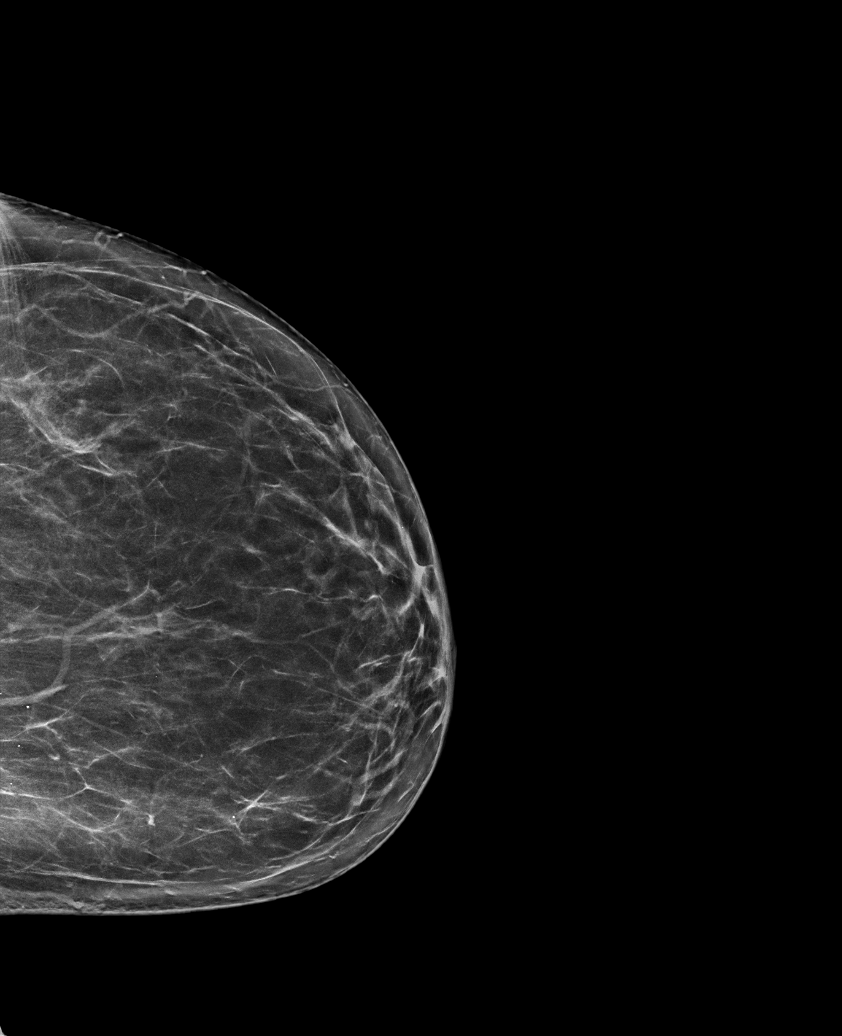

[R CC tomo · tomo slice 35/70.0]
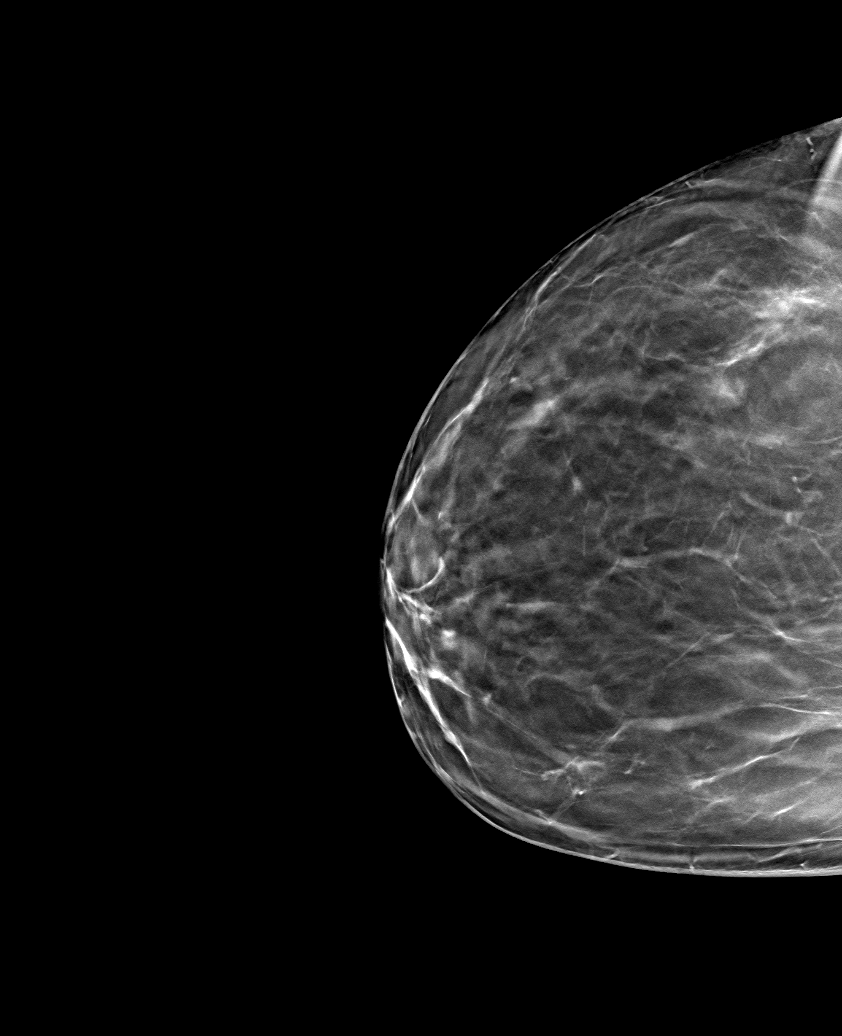

[6 of 30 positions shown; findings below may reference images not displayed]

ACR Breast Density Category b: There are scattered areas of
fibroglandular density.
FINDINGS: There are no findings suspicious for malignancy.
IMPRESSION: No mammographic evidence of malignancy. A result letter of this
screening mammogram will be mailed directly to the patient.

RECOMMENDATION:
Screening mammogram in one year. (Code:51-O-LD2)

BI-RADS CATEGORY  1: Negative.

## 2023-02-27 ENCOUNTER — Other Ambulatory Visit: Payer: Self-pay | Admitting: Internal Medicine

## 2023-02-27 ENCOUNTER — Encounter: Payer: Self-pay | Admitting: Internal Medicine

## 2023-02-27 DIAGNOSIS — E039 Hypothyroidism, unspecified: Secondary | ICD-10-CM

## 2023-02-27 MED ORDER — GABAPENTIN 100 MG PO CAPS: 100.0000 mg | ORAL_CAPSULE | Freq: Every day | ORAL | 1 refills | Status: DC

## 2023-02-28 NOTE — Telephone Encounter (Signed)
Requested medication (s) are due for refill today:yes  Requested medication (s) are on the active medication list: yes  Last refill:  08/29/22 #90 1 RF  Future visit scheduled:yes  Notes to clinic:  lab work out of range   Requested Prescriptions  Pending Prescriptions Disp Refills   ezetimibe (ZETIA) 10 MG tablet [Pharmacy Med Name: EZETIMIBE 10MG  TABLETS] 90 tablet     Sig: TAKE 1 TABLET BY MOUTH EVERY DAY     Cardiovascular:  Antilipid - Sterol Transport Inhibitors Failed - 02/27/2023  3:30 AM      Failed - ALT in normal range and within 360 days    ALT  Date Value Ref Range Status  12/01/2022 35 (H) 6 - 29 U/L Final         Failed - Lipid Panel in normal range within the last 12 months    Cholesterol  Date Value Ref Range Status  12/01/2022 112 <200 mg/dL Final   LDL Cholesterol (Calc)  Date Value Ref Range Status  12/01/2022 48 mg/dL (calc) Final    Comment:    Reference range: <100 . Desirable range <100 mg/dL for primary prevention;   <70 mg/dL for patients with CHD or diabetic patients  with > or = 2 CHD risk factors. [name] LDL-C is now calculated using the Martin-Hopkins  calculation, which is a validated novel method providing  better accuracy than the Friedewald equation in the  estimation of LDL-C.  Horald Pollen et al. Lenox Ahr. 7829;562(13): 2061-2068  (http://education.[facility].com/faq/FAQ164)    Direct LDL  Date Value Ref Range Status  07/15/2020 206.0 mg/dL Final    Comment:    Optimal:  <100 mg/dLNear or Above Optimal:  100-129 mg/dLBorderline High:  130-159 mg/dLHigh:  160-189 mg/dLVery High:  >190 mg/dL   HDL  Date Value Ref Range Status  12/01/2022 38 (L) > OR = 50 mg/dL Final   Triglycerides  Date Value Ref Range Status  12/01/2022 187 (H) <150 mg/dL Final         Passed - AST in normal range and within 360 days    AST  Date Value Ref Range Status  12/01/2022 26 10 - 35 U/L Final         Passed - Patient is not pregnant       Passed - Valid encounter within last 12 months    Recent Outpatient Visits           1 month ago Anal fissure   [place] [facility] [place], [place] W, NP   2 months ago Type 2 diabetes mellitus with hyperglycemia, without long-term current use of insulin [facility])   [place] [facility] [place], Salvadore Oxford, NP   9 months ago Tension headache   [place] [facility] [place], Salvadore Oxford, NP   1 year ago Encounter for general adult medical examination with abnormal findings   [place] [facility], Salvadore Oxford, NP   1 year ago LLQ pain   [place] [facility] [place], Salvadore Oxford, NP       Future Appointments             In 1 week [place] Si, Salvadore Oxford, NP [place] [facility], [facility]   In 1 week [place], Salvadore Oxford, NP [place], [place]            Signed Prescriptions Disp Refills   rosuvastatin (CRESTOR) 10 MG  tablet 90 tablet 0    Sig: TAKE 1 TABLET BY MOUTH EVERY DAY     Cardiovascular:  Antilipid - Statins 2 Failed - 02/27/2023  3:30 AM      Failed - Lipid Panel in normal range within the last 12 months    Cholesterol  Date Value Ref Range Status  12/01/2022 112 <200 mg/dL Final   LDL Cholesterol (Calc)  Date Value Ref Range Status  12/01/2022 48 mg/dL (calc) Final    Comment:    Reference range: <100 . Desirable range <100 mg/dL for primary prevention;   <70 mg/dL for patients with CHD or diabetic patients  with > or = 2 CHD risk factors. [name] LDL-C is now calculated using the Martin-Hopkins  calculation, which is a validated novel method providing  better accuracy than the Friedewald equation in the  estimation of LDL-C.  Horald Pollen et al. Lenox Ahr. 1610;960(45): 2061-2068  (http://education.[facility].com/faq/FAQ164)    Direct LDL  Date Value Ref Range Status  07/15/2020 206.0 mg/dL Final    Comment:    Optimal:  <100 mg/dLNear or Above Optimal:   100-129 mg/dLBorderline High:  130-159 mg/dLHigh:  160-189 mg/dLVery High:  >190 mg/dL   HDL  Date Value Ref Range Status  12/01/2022 38 (L) > OR = 50 mg/dL Final   Triglycerides  Date Value Ref Range Status  12/01/2022 187 (H) <150 mg/dL Final         Passed - Cr in normal range and within 360 days    Creat  Date Value Ref Range Status  12/01/2022 0.65 0.50 - 1.05 mg/dL Final   Creatinine,U  Date Value Ref Range Status  02/12/2019 75.8 mg/dL Final   Creatinine, Urine  Date Value Ref Range Status  01/13/2022 115 20 - 275 mg/dL Final         Passed - Patient is not pregnant      Passed - Valid encounter within last 12 months    Recent Outpatient Visits           1 month ago Anal fissure   [place] [facility] [place], [place] W, NP   2 months ago Type 2 diabetes mellitus with hyperglycemia, without long-term current use of insulin [facility])   [place] [facility] [place], Salvadore Oxford, NP   9 months ago Tension headache   [place] [facility] [place], Salvadore Oxford, NP   1 year ago Encounter for general adult medical examination with abnormal findings   [place] [facility] [place], Salvadore Oxford, NP   1 year ago LLQ pain   [place] [facility] [place], Salvadore Oxford, NP       Future Appointments             In 1 week Sam[place] Si, Salvadore Oxford, NP [place] [facility], [facility]   In 1 week [place], Salvadore Oxford, NP  [facility], [facility]             levothyroxine (SYNTHROID) 112 MCG tablet 90 tablet 0    Sig: TAKE 1 TABLET BY MOUTH EVERY DAY NEED APPT FOR LABS     Endocrinology:  Hypothyroid Agents Passed - 02/27/2023  3:30 AM      Passed - TSH in normal range and within 360 days    TSH  Date Value Ref Range Status  12/01/2022 4.04 0.40 - 4.50 mIU/L Final  Passed - Valid encounter within last 12 months    Recent Outpatient Visits           1 month ago Anal  fissure   [place] [facility] [place], [place] W, NP   2 months ago Type 2 diabetes mellitus with hyperglycemia, without long-term current use of insulin [facility])   [place] [facility] [place], Salvadore Oxford, NP   9 months ago Tension headache   [place] [facility] [place], Salvadore Oxford, NP   1 year ago Encounter for general adult medical examination with abnormal findings   [place] [facility] [place], Salvadore Oxford, NP   1 year ago LLQ pain   [place] [facility] [place], Salvadore Oxford, NP       Future Appointments             In 1 week Sampson Si, Salvadore Oxford, NP [place] [facility], [facility]   In 1 week [place], Salvadore Oxford, NP [facility] [place], [place]

## 2023-02-28 NOTE — Telephone Encounter (Signed)
Requested Prescriptions  Pending Prescriptions Disp Refills   rosuvastatin (CRESTOR) 10 MG tablet [Pharmacy Med Name: ROSUVASTATIN 10MG  TABLETS] 90 tablet 0    Sig: TAKE 1 TABLET BY MOUTH EVERY DAY     Cardiovascular:  Antilipid - Statins 2 Failed - 02/27/2023  3:30 AM      Failed - Lipid Panel in normal range within the last 12 months    Cholesterol  Date Value Ref Range Status  12/01/2022 112 <200 mg/dL Final   LDL Cholesterol (Calc)  Date Value Ref Range Status  12/01/2022 48 mg/dL (calc) Final    Comment:    Reference range: <100 . Desirable range <100 mg/dL for primary prevention;   <70 mg/dL for patients with CHD or diabetic patients  with > or = 2 CHD risk factors. [name] LDL-C is now calculated using the Martin-Hopkins  calculation, which is a validated novel method providing  better accuracy than the Friedewald equation in the  estimation of LDL-C.  Horald Pollen et al. Lenox Ahr. 1610;960(45): 2061-2068  (http://education.[facility].com/faq/FAQ164)    Direct LDL  Date Value Ref Range Status  07/15/2020 206.0 mg/dL Final    Comment:    Optimal:  <100 mg/dLNear or Above Optimal:  100-129 mg/dLBorderline High:  130-159 mg/dLHigh:  160-189 mg/dLVery High:  >190 mg/dL   HDL  Date Value Ref Range Status  12/01/2022 38 (L) > OR = 50 mg/dL Final   Triglycerides  Date Value Ref Range Status  12/01/2022 187 (H) <150 mg/dL Final         Passed - Cr in normal range and within 360 days    Creat  Date Value Ref Range Status  12/01/2022 0.65 0.50 - 1.05 mg/dL Final   Creatinine,U  Date Value Ref Range Status  02/12/2019 75.8 mg/dL Final   Creatinine, Urine  Date Value Ref Range Status  01/13/2022 115 20 - 275 mg/dL Final         Passed - Patient is not pregnant      Passed - Valid encounter within last 12 months    Recent Outpatient Visits           1 month ago Anal fissure   [place] [facility] [place], [place] W, NP   2 months ago Type 2  diabetes mellitus with hyperglycemia, without long-term current use of insulin [facility])   [place] [facility] [place], Salvadore Oxford, NP   9 months ago Tension headache   [place] [facility] [place], Salvadore Oxford, NP   1 year ago Encounter for general adult medical examination with abnormal findings   [place] [facility] [place], Salvadore Oxford, NP   1 year ago LLQ pain   [place] [facility] [place], Salvadore Oxford, NP       Future Appointments             In 1 week Sam[place] Si, Salvadore Oxford, NP [place] [facility], [facility]   In 1 week [place], Salvadore Oxford, NP  [facility], [facility]             ezetimibe (ZETIA) 10 MG tablet [Pharmacy Med Name: EZETIMIBE 10MG  TABLETS] 90 tablet     Sig: TAKE 1 TABLET BY MOUTH EVERY DAY     Cardiovascular:  Antilipid - Sterol Transport Inhibitors Failed - 02/27/2023  3:30 AM      Failed - ALT in normal range and within 360 days  ALT  Date Value Ref Range Status  12/01/2022 35 (H) 6 - 29 U/L Final         Failed - Lipid Panel in normal range within the last 12 months    Cholesterol  Date Value Ref Range Status  12/01/2022 112 <200 mg/dL Final   LDL Cholesterol (Calc)  Date Value Ref Range Status  12/01/2022 48 mg/dL (calc) Final    Comment:    Reference range: <100 . Desirable range <100 mg/dL for primary prevention;   <70 mg/dL for patients with CHD or diabetic patients  with > or = 2 CHD risk factors. [name] LDL-C is now calculated using the Martin-Hopkins  calculation, which is a validated novel method providing  better accuracy than the Friedewald equation in the  estimation of LDL-C.  Horald Pollen et al. Lenox Ahr. 2952;841(32): 2061-2068  (http://education.[facility].com/faq/FAQ164)    Direct LDL  Date Value Ref Range Status  07/15/2020 206.0 mg/dL Final    Comment:    Optimal:  <100 mg/dLNear or Above Optimal:  100-129 mg/dLBorderline High:  130-159  mg/dLHigh:  160-189 mg/dLVery High:  >190 mg/dL   HDL  Date Value Ref Range Status  12/01/2022 38 (L) > OR = 50 mg/dL Final   Triglycerides  Date Value Ref Range Status  12/01/2022 187 (H) <150 mg/dL Final         Passed - AST in normal range and within 360 days    AST  Date Value Ref Range Status  12/01/2022 26 10 - 35 U/L Final         Passed - Patient is not pregnant      Passed - Valid encounter within last 12 months    Recent Outpatient Visits           1 month ago Anal fissure   [place] [facility] [place], [place] W, NP   2 months ago Type 2 diabetes mellitus with hyperglycemia, without long-term current use of insulin [facility])   [place] [facility] [place], Salvadore Oxford, NP   9 months ago Tension headache   [place] [facility] [employer], Salvadore Oxford, NP   1 year ago Encounter for general adult medical examination with abnormal findings   [place] [facility] [place], Salvadore Oxford, NP   1 year ago LLQ pain   [place] [facility] [place], Salvadore Oxford, NP       Future Appointments             In 1 week Sampson Si, Salvadore Oxford, NP [place] [facility], [facility]   In 1 week [place], Salvadore Oxford, NP [facility], [facility]             levothyroxine (SYNTHROID) 112 MCG tablet [Pharmacy Med Name: LEVOTHYROXINE 0.112MG  ( ) TABS] 90 tablet 0    Sig: TAKE 1 TABLET BY MOUTH EVERY DAY NEED APPT FOR LABS     Endocrinology:  Hypothyroid Agents Passed - 02/27/2023  3:30 AM      Passed - TSH in normal range and within 360 days    TSH  Date Value Ref Range Status  12/01/2022 4.04 0.40 - 4.50 mIU/L Final         Passed - Valid encounter within last 12 months    Recent Outpatient Visits           1 month ago Anal fissure    [facility] [place], [place], NP   2 months ago Type 2 diabetes mellitus with hyperglycemia, without long-term current use  of insulin [facility])   [facility] [place], Salvadore Oxford, NP   9 months ago Tension headache   [place] [facility] [place], Salvadore Oxford, NP   1 year ago Encounter for general adult medical examination with abnormal findings   [place] [facility] [place], Salvadore Oxford, NP   1 year ago LLQ pain   [place] [facility] [place], Salvadore Oxford, NP       Future Appointments             In 1 week Sam[place] Si, Salvadore Oxford, NP [facility]   In 1 week [place], Salvadore Oxford, NP [facility], [place]

## 2023-03-02 ENCOUNTER — Ambulatory Visit: Payer: Self-pay | Admitting: *Deleted

## 2023-03-02 ENCOUNTER — Encounter: Payer: Self-pay | Admitting: Internal Medicine

## 2023-03-02 NOTE — Telephone Encounter (Addendum)
Summary: wound on foot   Patient states that she has a wound on her foot that is not healing even after taking antibiotics. Please advise.          Attempted to call patient- no answer- left message to call office

## 2023-03-02 NOTE — Telephone Encounter (Signed)
2nd attempt. Patient called, left VM to return the call to the office to speak to the NT.   Summary: wound on foot   Patient states that she has a wound on her foot that is not healing even after taking antibiotics. Please advise.

## 2023-03-02 NOTE — Telephone Encounter (Signed)
Patient has been in contact with provider in MyChart and has received advice and instruction: Keep a close eye on it.  I would probably put Neosporin on it and cover with a Band-Aid and change it daily.  As long as the area is getting smaller every week and the redness around it does not increase and you do not know any pus draining from it then I think it will be fine.  It is normal to have a slight yellow wound bed but not green, brown or black.  No need to contact- will close note

## 2023-03-06 ENCOUNTER — Other Ambulatory Visit: Payer: Self-pay | Admitting: Family Medicine

## 2023-03-06 DIAGNOSIS — Z1231 Encounter for screening mammogram for malignant neoplasm of breast: Secondary | ICD-10-CM

## 2023-03-06 LAB — BASIC METABOLIC PANEL
BUN: 16 (ref 4–21)
CO2: 24 — AB (ref 13–22)
Chloride: 95 — AB (ref 99–108)
Creatinine: 0.8 (ref 0.5–1.1)
Glucose: 119
Potassium: 4.1 meq/L (ref 3.5–5.1)
Sodium: 136 — AB (ref 137–147)

## 2023-03-06 LAB — LIPID PANEL
Cholesterol: 121 (ref 0–200)
HDL: 31 — AB (ref 35–70)
LDL Cholesterol: 61
Triglycerides: 169 — AB (ref 40–160)

## 2023-03-06 LAB — HEMOGLOBIN A1C: Hemoglobin A1C: 7.2

## 2023-03-06 LAB — HEPATIC FUNCTION PANEL
ALT: 24 U/L (ref 7–35)
AST: 19 (ref 13–35)
Alkaline Phosphatase: 56 (ref 25–125)
Bilirubin, Total: 0.5

## 2023-03-06 LAB — COMPREHENSIVE METABOLIC PANEL
Albumin: 4.8 (ref 3.5–5.0)
Calcium: 9.9 (ref 8.7–10.7)
Globulin: 2.1
eGFR: 83

## 2023-03-06 LAB — TSH: TSH: 2.21 (ref 0.41–5.90)

## 2023-03-08 ENCOUNTER — Ambulatory Visit (INDEPENDENT_AMBULATORY_CARE_PROVIDER_SITE_OTHER): Payer: BC Managed Care – PPO | Admitting: Internal Medicine

## 2023-03-08 ENCOUNTER — Encounter: Payer: Self-pay | Admitting: Internal Medicine

## 2023-03-08 VITALS — BP 100/62 | HR 94 | Temp 96.2°F | Ht 66.0 in | Wt 149.0 lb

## 2023-03-08 DIAGNOSIS — E1141 Type 2 diabetes mellitus with diabetic mononeuropathy: Secondary | ICD-10-CM

## 2023-03-08 DIAGNOSIS — Z0001 Encounter for general adult medical examination with abnormal findings: Secondary | ICD-10-CM | POA: Diagnosis not present

## 2023-03-08 DIAGNOSIS — Z23 Encounter for immunization: Secondary | ICD-10-CM | POA: Diagnosis not present

## 2023-03-08 DIAGNOSIS — L089 Local infection of the skin and subcutaneous tissue, unspecified: Secondary | ICD-10-CM

## 2023-03-08 DIAGNOSIS — E11628 Type 2 diabetes mellitus with other skin complications: Secondary | ICD-10-CM

## 2023-03-08 LAB — POCT GLYCOSYLATED HEMOGLOBIN (HGB A1C): HbA1c, POC (controlled diabetic range): 7.4 % — AB (ref 0.0–7.0)

## 2023-03-08 MED ORDER — DOXYCYCLINE HYCLATE 100 MG PO TABS: 100.0000 mg | ORAL_TABLET | Freq: Two times a day (BID) | ORAL | 0 refills | Status: DC

## 2023-03-08 MED ORDER — OZEMPIC (1 MG/DOSE) 4 MG/3ML ~~LOC~~ SOPN: 1.0000 mg | PEN_INJECTOR | SUBCUTANEOUS | 0 refills | Status: DC

## 2023-03-08 NOTE — Progress Notes (Signed)
Subjective:    Patient ID: Mary Schaefer, female    DOB: 1960-07-03, 62 y.o.   MRN: [id number]  HPI  Patient presents to clinic today for her annual exam.  She also reports a wound to her left heel.  She was seen in urgent care 9/14 for the same and placed on Bactrim.  She has been using mupirocin cream twice daily.  Flu: 02/2022 Tetanus: 01/2014 COVID: x 3 Pneumovax: 02/2019 Shingrix: 05/2022 Pap smear: 03/2022 Mammogram: 03/2022, scheduled Bone density: Scheduled 07/2023 Colon screening: 06/2022 Vision screening: annually Dentist: biannually  Diet: She does eat meat. She consumes fruits and veggies. She tries to avoid fried foods. She drinks mostly water, dt soda. Exercise: Walking   Review of Systems     Past Medical History:  Diagnosis Date   Blood in stool    Childhood asthma    Diabetes mellitus without complication (HCC)    Dysplastic nevus 03/28/2022   left lateral thigh, excised 04/26/22, margins free   Endometriosis 1997   Hypertension    Thyroid disease     Current Outpatient Medications  Medication Sig Dispense Refill   acetaminophen (TYLENOL) 500 MG tablet Take 1,000 mg by mouth as needed for mild pain.     Ascorbic Acid (VITAMIN C PO) Take 1 tablet by mouth daily.     Blood Glucose Monitoring Suppl (ONETOUCH VERIO REFLECT) w/Device KIT 1 Device by Does not apply route 3 (three) times daily. 1 kit 0   Calcium Carbonate-Vitamin D3 600-400 MG-UNIT TABS Take by mouth.     cyanocobalamin 2000 MCG tablet Take 2,000 mcg by mouth 2 (two) times daily.     ezetimibe (ZETIA) 10 MG tablet TAKE 1 TABLET BY MOUTH EVERY DAY 90 tablet 1   gabapentin (NEURONTIN) 100 MG capsule Take 1 capsule (100 mg total) by mouth at bedtime. 90 capsule 1   glucose blood (ONETOUCH VERIO) test strip Check blood sugar 6 x daily. DX E11.9 200 each 3   hydrocortisone-pramoxine (PROCTOFOAM HC) rectal foam Place 1 applicator rectally 2 (two) times daily. 10 g 1   hydrOXYzine (VISTARIL) 25 MG  capsule TAKE 1 CAPSULE (25 MG TOTAL) BY MOUTH DAILY AS NEEDED. 90 capsule 1   Lancets (ONETOUCH ULTRASOFT) lancets Use as instructed 100 each 12   levothyroxine (SYNTHROID) 112 MCG tablet TAKE 1 TABLET BY MOUTH EVERY DAY NEED APPT FOR LABS 90 tablet 0   lisinopril-hydrochlorothiazide (ZESTORETIC) 10-12.5 MG tablet Take 1 tablet by mouth daily. 90 tablet 1   metFORMIN (GLUCOPHAGE-XR) 500 MG 24 hr tablet Take 2 tablets (1,000 mg total) by mouth 2 (two) times daily with a meal. 120 tablet 3   Nitroglycerin 0.4 % OINT Apply 1 inch (375 mg) ointment intra-anally every 12 hours for anal fissure 30 g 0   omeprazole (PRILOSEC) 10 MG capsule Take 10 mg by mouth daily.     ondansetron (ZOFRAN-ODT) 4 MG disintegrating tablet Take 1 tablet (4 mg total) by mouth every 8 (eight) hours as needed for nausea or vomiting. 30 tablet 0   OZEMPIC, 0.25 OR 0.5 MG/DOSE, 2 MG/3ML SOPN Inject 0.5 mg into the skin once a week. Start with 0.25mg  weekly x 4 weeks then increase to 0.5mg  weekly injection. 9 mL 0   prednisoLONE acetate (PRED FORTE) 1 % ophthalmic suspension INSTILL 1 DROP INTO LEFT EYE 4 TIMES A DAY     promethazine (PHENERGAN) 25 MG tablet Take 1 tablet (25 mg total) by mouth every 8 (eight) hours as needed  for nausea or vomiting. 30 tablet 0   rosuvastatin (CRESTOR) 10 MG tablet TAKE 1 TABLET BY MOUTH EVERY DAY 90 tablet 0   No current facility-administered medications for this visit.    Allergies  Allergen Reactions   Aspirin Shortness Of Breath and Hives   Penicillins Hives    Family History  Problem Relation Age of Onset   Ovarian cancer Mother    Hyperlipidemia Mother    Alcohol abuse Father    Hyperlipidemia Father    Heart disease Father    Stroke Father    Hypertension Father    Diabetes Sister    Colon cancer Neg Hx    Esophageal cancer Neg Hx    Breast cancer Neg Hx     Social History   Socioeconomic History   Marital status: Married    Spouse name: Not on file   Number of  children: Not on file   Years of education: Not on file   Highest education level: Not on file  Occupational History   Not on file  Tobacco Use   Smoking status: Never   Smokeless tobacco: Never  Vaping Use   Vaping status: Never Used  Substance and Sexual Activity   Alcohol use: Yes    Alcohol/week: 1.0 - 2.0 standard drink of alcohol    Types: 1 - 2 Glasses of wine per week    Comment: rare   Drug use: Not Currently   Sexual activity: Yes  Other Topics Concern   Not on file  Social History Narrative   ** Merged History Encounter **       Social Determinants of Health   Financial Resource Strain: Not on file  Food Insecurity: Not on file  Transportation Needs: Not on file  Physical Activity: Not on file  Stress: Not on file  Social Connections: Not on file  Intimate Partner Violence: Not on file     Constitutional: Denies fever, malaise, fatigue, headache or abrupt weight changes.  HEENT: Denies eye pain, eye redness, ear pain, ringing in the ears, wax buildup, runny nose, nasal congestion, bloody nose, or sore throat. Respiratory: Denies difficulty breathing, shortness of breath, cough or sputum production.   Cardiovascular: Denies chest pain, chest tightness, palpitations or swelling in the hands or feet.  Gastrointestinal: Pt reports constipation. Denies abdominal pain, bloating, diarrhea or blood in the stool.  GU: Denies urgency, frequency, pain with urination, burning sensation, blood in urine, odor or discharge. Musculoskeletal: Denies decrease in range of motion, difficulty with gait, muscle pain or joint pain and swelling.  Skin: Patient reports wound to left heel.  Denies redness, rashes, lesions.  Neurological: Denies dizziness, difficulty with memory, difficulty with speech or problems with balance and coordination.  Psych: Patient has a history of anxiety.  Denies depression, SI/HI.  No other specific complaints in a complete review of systems (except as  listed in HPI above).  Objective:   Physical Exam   BP 100/62 (BP Location: Left Arm, Patient Position: Sitting, Cuff Size: Normal)   Pulse 94   Temp (!) 96.2 F (35.7 C) (Temporal)   Ht 5\' 6"  (1.676 m)   Wt 149 lb (67.6 kg)   LMP 09/10/2016   SpO2 97%   BMI 24.05 kg/m   Wt Readings from Last 3 Encounters:  01/03/23 165 lb (74.8 kg)  12/01/22 167 lb (75.8 kg)  07/10/22 167 lb 5.3 oz (75.9 kg)    General: Appears her stated age, well developed, well nourished in  NAD. Skin: 1.5 cm round scabbed wound to left medial heel.  Marginal redness but there does appear to be pus at the central opening of the wound. HEENT: Head: normal shape and size; Eyes: sclera white, no icterus, conjunctiva pink, PERRLA and EOMs intact;  Cardiovascular: Normal rate and rhythm. S1,S2 noted.  No murmur, rubs or gallops noted. No JVD or BLE edema. No carotid bruits noted. Pulmonary/Chest: Normal effort and positive vesicular breath sounds. No respiratory distress. No wheezes, rales or ronchi noted.  Abdomen: Soft and nontender. Normal bowel sounds.  Musculoskeletal: Strength 5/5 BUE/BLE. No difficulty with gait.  Neurological: Alert and oriented. Cranial nerves II-XII grossly intact. Coordination normal.  Psychiatric: Mood and affect normal. Behavior is normal. Judgment and thought content normal.    BMET    Component Value Date/Time   NA 138 12/01/2022 0833   K 4.1 12/01/2022 0833   CL 98 12/01/2022 0833   CO2 28 12/01/2022 0833   GLUCOSE 203 (H) 12/01/2022 0833   BUN 14 12/01/2022 0833   CREATININE 0.65 12/01/2022 0833   CALCIUM 9.9 12/01/2022 0833   GFRNONAA >60 04/05/2021 1500   GFRAA >60 09/17/2017 0732    Lipid Panel     Component Value Date/Time   CHOL 112 12/01/2022 0833   TRIG 187 (H) 12/01/2022 0833   HDL 38 (L) 12/01/2022 0833   CHOLHDL 2.9 12/01/2022 0833   VLDL 43.2 (H) 09/02/2019 0809   LDLCALC 48 12/01/2022 0833    CBC    Component Value Date/Time   WBC 6.8  12/01/2022 0833   RBC 5.31 (H) 12/01/2022 0833   HGB 15.6 (H) 12/01/2022 0833   HCT 47.6 (H) 12/01/2022 0833   PLT 295 12/01/2022 0833   MCV 89.6 12/01/2022 0833   MCH 29.4 12/01/2022 0833   MCHC 32.8 12/01/2022 0833   RDW 12.5 12/01/2022 0833    Hgb A1C Lab Results  Component Value Date   HGBA1C 10.7 (A) 12/01/2022           Assessment & Plan:   Preventative health maintenance:  Flu shot today Tetanus UTD COVID-vaccine UTD Pneumovax UTD Encouraged her to get her second Shingrix vaccine Pap smear UTD Mammogram has already been scheduled Bone density has already been scheduled Colon screening UTD Encouraged her to consume a balanced diet and exercise regimen Advised her to see an eye doctor and dentist annually She will give me a copy of her recent lab results  Diabetic wound of left foot:  Rx for doxycycline 100 mg twice daily x 10 days  RTC in 3 months, follow-up chronic conditions Nicki Reaper, NP

## 2023-03-08 NOTE — Patient Instructions (Signed)
Health Maintenance for Postmenopausal Women Menopause is a normal process in which your ability to get pregnant comes to an end. This process happens slowly over many months or years, usually between the ages of 91 and 60. Menopause is complete when you have missed your menstrual period for 12 months. It is important to talk with your health care provider about some of the most common conditions that affect women after menopause (postmenopausal women). These include heart disease, cancer, and bone loss (osteoporosis). Adopting a healthy lifestyle and getting preventive care can help to promote your health and wellness. The actions you take can also lower your chances of developing some of these common conditions. What are the signs and symptoms of menopause? During menopause, you may have the following symptoms: Hot flashes. These can be moderate or severe. Night sweats. Decrease in sex drive. Mood swings. Headaches. Tiredness (fatigue). Irritability. Memory problems. Problems falling asleep or staying asleep. Talk with your health care provider about treatment options for your symptoms. Do I need hormone replacement therapy? Hormone replacement therapy is effective in treating symptoms that are caused by menopause, such as hot flashes and night sweats. Hormone replacement carries certain risks, especially as you become older. If you are thinking about using estrogen or estrogen with progestin, discuss the benefits and risks with your health care provider. How can I reduce my risk for heart disease and stroke? The risk of heart disease, heart attack, and stroke increases as you age. One of the causes may be a change in the body's hormones during menopause. This can affect how your body uses dietary fats, triglycerides, and cholesterol. Heart attack and stroke are medical emergencies. There are many things that you can do to help prevent heart disease and stroke. Watch your blood pressure High  blood pressure causes heart disease and increases the risk of stroke. This is more likely to develop in people who have high blood pressure readings or are overweight. Have your blood pressure checked: Every 3-5 years if you are 12-70 years of age. Every year if you are 38 years old or older. Eat a healthy diet  Eat a diet that includes plenty of vegetables, fruits, low-fat dairy products, and lean protein. Do not eat a lot of foods that are high in solid fats, added sugars, or sodium. Get regular exercise Get regular exercise. This is one of the most important things you can do for your health. Most adults should: Try to exercise for at least 150 minutes each week. The exercise should increase your heart rate and make you sweat (moderate-intensity exercise). Try to do strengthening exercises at least twice each week. Do these in addition to the moderate-intensity exercise. Spend less time sitting. Even light physical activity can be beneficial. Other tips Work with your health care provider to achieve or maintain a healthy weight. Do not use any products that contain nicotine or tobacco. These products include cigarettes, chewing tobacco, and vaping devices, such as e-cigarettes. If you need help quitting, ask your health care provider. Know your numbers. Ask your health care provider to check your cholesterol and your blood sugar (glucose). Continue to have your blood tested as directed by your health care provider. Do I need screening for cancer? Depending on your health history and family history, you may need to have cancer screenings at different stages of your life. This may include screening for: Breast cancer. Cervical cancer. Lung cancer. Colorectal cancer. What is my risk for osteoporosis? After menopause, you may be  at increased risk for osteoporosis. Osteoporosis is a condition in which bone destruction happens more quickly than new bone creation. To help prevent osteoporosis or  the bone fractures that can happen because of osteoporosis, you may take the following actions: If you are 25-65 years old, get at least 1,000 mg of calcium and at least 600 international units (IU) of vitamin D per day. If you are older than age 72 but younger than age 71, get at least 1,200 mg of calcium and at least 600 international units (IU) of vitamin D per day. If you are older than age 2, get at least 1,200 mg of calcium and at least 800 international units (IU) of vitamin D per day. Smoking and drinking excessive alcohol increase the risk of osteoporosis. Eat foods that are rich in calcium and vitamin D, and do weight-bearing exercises several times each week as directed by your health care provider. How does menopause affect my mental health? Depression may occur at any age, but it is more common as you become older. Common symptoms of depression include: Feeling depressed. Changes in sleep patterns. Changes in appetite or eating patterns. Feeling an overall lack of motivation or enjoyment of activities that you previously enjoyed. Frequent crying spells. Talk with your health care provider if you think that you are experiencing any of these symptoms. General instructions See your health care provider for regular wellness exams and vaccines. This may include: Scheduling regular health, dental, and eye exams. Getting and maintaining your vaccines. These include: Influenza vaccine. Get this vaccine each year before the flu season begins. Pneumonia vaccine. Shingles vaccine. Tetanus, diphtheria, and pertussis (Tdap) booster vaccine. Your health care provider may also recommend other immunizations. Tell your health care provider if you have ever been abused or do not feel safe at home. Summary Menopause is a normal process in which your ability to get pregnant comes to an end. This condition causes hot flashes, night sweats, decreased interest in sex, mood swings, headaches, or lack  of sleep. Treatment for this condition may include hormone replacement therapy. Take actions to keep yourself healthy, including exercising regularly, eating a healthy diet, watching your weight, and checking your blood pressure and blood sugar levels. Get screened for cancer and depression. Make sure that you are up to date with all your vaccines. This information is not intended to replace advice given to you by your health care provider. Make sure you discuss any questions you have with your health care provider. Document Revised: 10/18/2020 Document Reviewed: 10/18/2020 Elsevier Patient Education  2024 [employer].

## 2023-03-09 ENCOUNTER — Other Ambulatory Visit: Payer: Self-pay | Admitting: Internal Medicine

## 2023-03-09 ENCOUNTER — Ambulatory Visit: Payer: BC Managed Care – PPO | Admitting: Internal Medicine

## 2023-03-09 DIAGNOSIS — E039 Hypothyroidism, unspecified: Secondary | ICD-10-CM

## 2023-03-09 NOTE — Telephone Encounter (Signed)
Requested Prescriptions  Refused Prescriptions Disp Refills   rosuvastatin (CRESTOR) 10 MG tablet [Pharmacy Med Name: ROSUVASTATIN 10MG  TABLETS] 90 tablet 0    Sig: TAKE 1 TABLET BY MOUTH EVERY DAY     Cardiovascular:  Antilipid - Statins 2 Failed - 03/09/2023  8:00 AM      Failed - Lipid Panel in normal range within the last 12 months    Cholesterol  Date Value Ref Range Status  12/01/2022 112 <200 mg/dL Final   LDL Cholesterol (Calc)  Date Value Ref Range Status  12/01/2022 48 mg/dL (calc) Final    Comment:    Reference range: <100 . Desirable range <100 mg/dL for primary prevention;   <70 mg/dL for patients with CHD or diabetic patients  with > or = 2 CHD risk factors. [name] LDL-C is now calculated using the Martin-Hopkins  calculation, which is a validated novel method providing  better accuracy than the Friedewald equation in the  estimation of LDL-C.  Horald Pollen et al. Lenox Ahr. 6045;409(81): 2061-2068  (http://education.[employer].com/faq/FAQ164)    Direct LDL  Date Value Ref Range Status  07/15/2020 206.0 mg/dL Final    Comment:    Optimal:  <100 mg/dLNear or Above Optimal:  100-129 mg/dLBorderline High:  130-159 mg/dLHigh:  160-189 mg/dLVery High:  >190 mg/dL   HDL  Date Value Ref Range Status  12/01/2022 38 (L) > OR = 50 mg/dL Final   Triglycerides  Date Value Ref Range Status  12/01/2022 187 (H) <150 mg/dL Final         Passed - Cr in normal range and within 360 days    Creat  Date Value Ref Range Status  12/01/2022 0.65 0.50 - 1.05 mg/dL Final   Creatinine,U  Date Value Ref Range Status  02/12/2019 75.8 mg/dL Final   Creatinine, Urine  Date Value Ref Range Status  01/13/2022 115 20 - 275 mg/dL Final         Passed - Patient is not pregnant      Passed - Valid encounter within last 12 months    Recent Outpatient Visits           Yesterday Encounter for general adult medical examination with abnormal findings   [place] [facility] [place], Salvadore Oxford, NP   2 months ago Anal fissure   [place] [facility] [place], [place] W, NP   3 months ago Type 2 diabetes mellitus with hyperglycemia, without long-term current use of insulin [facility])   [place] [facility] [place], Salvadore Oxford, NP   9 months ago Tension headache   [place] [facility] [place], Salvadore Oxford, NP   1 year ago Encounter for general adult medical examination with abnormal findings   [place] [facility], Salvadore Oxford, NP       Future Appointments             In 3 months Baity, Salvadore Oxford, NP [facility]             levothyroxine (SYNTHROID) 112 MCG tablet [Pharmacy Med Name: LEVOTHYROXINE 0.112MG  ( ) TABS] 90 tablet 0    Sig: TAKE 1 TABLET BY MOUTH EVERY DAY NEED APPT FOR LABS     Endocrinology:  Hypothyroid Agents Passed - 03/09/2023  8:00 AM      Passed - TSH in normal range and within 360 days    TSH  Date Value Ref Range Status  12/01/2022 4.04 0.40 - 4.50 mIU/L Final         Passed - Valid encounter within last 12 months    Recent Outpatient Visits           Yesterday Encounter for general adult medical examination with abnormal findings   [place] [facility] [place], Salvadore Oxford, NP   2 months ago Anal fissure   [place] [facility] [place], [place] W, NP   3 months ago Type 2 diabetes mellitus with hyperglycemia, without long-term current use of insulin [facility])   [place] [facility] [place], Salvadore Oxford, NP   9 months ago Tension headache   [place] [facility] [place], Salvadore Oxford, NP   1 year ago Encounter for general adult medical examination with abnormal findings   [place] [facility] [place], Salvadore Oxford, NP       Future Appointments             In 3 months Baity, Salvadore Oxford, NP [place] [facility], [facility]

## 2023-03-09 NOTE — Telephone Encounter (Signed)
Are you able to abstract the creatinine, GFR, lipid panel

## 2023-03-16 ENCOUNTER — Encounter: Payer: Self-pay | Admitting: Internal Medicine

## 2023-03-29 ENCOUNTER — Encounter: Payer: 59 | Admitting: Dermatology

## 2023-03-30 ENCOUNTER — Other Ambulatory Visit: Payer: Self-pay | Admitting: Internal Medicine

## 2023-03-30 DIAGNOSIS — Z1231 Encounter for screening mammogram for malignant neoplasm of breast: Secondary | ICD-10-CM

## 2023-03-30 NOTE — Telephone Encounter (Signed)
Requested Prescriptions  Pending Prescriptions Disp Refills   metFORMIN (GLUCOPHAGE-XR) 500 MG 24 hr tablet [Pharmacy Med Name: METFORMIN ER 500MG  24HR TABS] 120 tablet 1    Sig: TAKE 2 TABLETS(1000 MG) BY MOUTH TWICE DAILY WITH A MEAL     Endocrinology:  Diabetes - Biguanides Failed - 03/30/2023  8:00 AM      Failed - B12 Level in normal range and within 720 days    Vitamin B-12  Date Value Ref Range Status  02/10/2021 1,542 (H) 200 - 1,100 pg/mL Final         Failed - CBC within normal limits and completed in the last 12 months    WBC  Date Value Ref Range Status  12/01/2022 6.8 3.8 - 10.8 Thousand/uL Final   RBC  Date Value Ref Range Status  12/01/2022 5.31 (H) 3.80 - 5.10 Million/uL Final   Hemoglobin  Date Value Ref Range Status  12/01/2022 15.6 (H) 11.7 - 15.5 g/dL Final   HCT  Date Value Ref Range Status  12/01/2022 47.6 (H) 35.0 - 45.0 % Final   MCHC  Date Value Ref Range Status  12/01/2022 32.8 32.0 - 36.0 g/dL Final   [facility]  Date Value Ref Range Status  12/01/2022 29.4 27.0 - 33.0 pg Final   MCV  Date Value Ref Range Status  12/01/2022 89.6 80.0 - 100.0 fL Final   No results found for: "PLTCOUNTKUC", "LABPLAT", "POCPLA" RDW  Date Value Ref Range Status  12/01/2022 12.5 11.0 - 15.0 % Final         Passed - Cr in normal range and within 360 days    Creatinine  Date Value Ref Range Status  03/06/2023 0.8 0.5 - 1.1 Final   Creat  Date Value Ref Range Status  12/01/2022 0.65 0.50 - 1.05 mg/dL Final   Creatinine,U  Date Value Ref Range Status  02/12/2019 75.8 mg/dL Final   Creatinine, Urine  Date Value Ref Range Status  01/13/2022 115 20 - 275 mg/dL Final         Passed - HBA1C is between 0 and 7.9 and within 180 days    Hemoglobin A1C  Date Value Ref Range Status  03/06/2023 7.2  Final   HbA1c, POC (controlled diabetic range)  Date Value Ref Range Status  03/08/2023 7.4 (A) 0.0 - 7.0 % Final         Passed - eGFR in normal range and  within 360 days    GFR calc Af Amer  Date Value Ref Range Status  09/17/2017 >60 >60 mL/min Final    Comment:    (NOTE) The eGFR has been calculated using the CKD EPI equation. This calculation has not been validated in all clinical situations. eGFR's persistently <60 mL/min signify possible Chronic Kidney Disease.    GFR, Estimated  Date Value Ref Range Status  04/05/2021 >60 >60 mL/min Final    Comment:    (NOTE) Calculated using the CKD-EPI Creatinine Equation (2021)    GFR  Date Value Ref Range Status  07/15/2020 71.39 >60.00 mL/min Final    Comment:    Calculated using the CKD-EPI Creatinine Equation (2021)   eGFR  Date Value Ref Range Status  03/06/2023 83  Final         Passed - Valid encounter within last 6 months    Recent Outpatient Visits           3 weeks ago Encounter for general adult medical examination with abnormal findings  [place] [facility] [place], Salvadore Oxford, NP   2 months ago Anal fissure   [place] [facility] [place], [place], NP   3 months ago Type 2 diabetes mellitus with hyperglycemia, without long-term current use of insulin [facility]   [place] [facility] [place], Salvadore Oxford, NP   10 months ago Tension headache   [place] [facility] [place], Salvadore Oxford, NP   1 year ago Encounter for general adult medical examination with abnormal findings   [place] [facility] [place], Salvadore Oxford, NP       Future Appointments             In 2 months Baity, Salvadore Oxford, NP [place] [facility], [facility]

## 2023-04-01 IMAGING — US US PELVIS COMPLETE WITH TRANSVAGINAL
1 series · 14 of 25 positions shown · non-contrast
Comparison: Ultrasound 03/02/2017

CLINICAL DATA: Left-sided pelvic pain

EXAM:
TRANSABDOMINAL AND TRANSVAGINAL ULTRASOUND OF PELVIS
TECHNIQUE: Both transabdominal and transvaginal ultrasound examinations of the
pelvis were performed. Transabdominal technique was performed for
global imaging of the pelvis including uterus, ovaries, adnexal
regions, and pelvic cul-de-sac. It was necessary to proceed with
endovaginal exam following the transabdominal exam to visualize the
uterus endometrium ovaries.

[Series 1: us pelvis complete with transvaginal · 0.24mm/px · 14 of 77 slices shown]
[im 1/77]
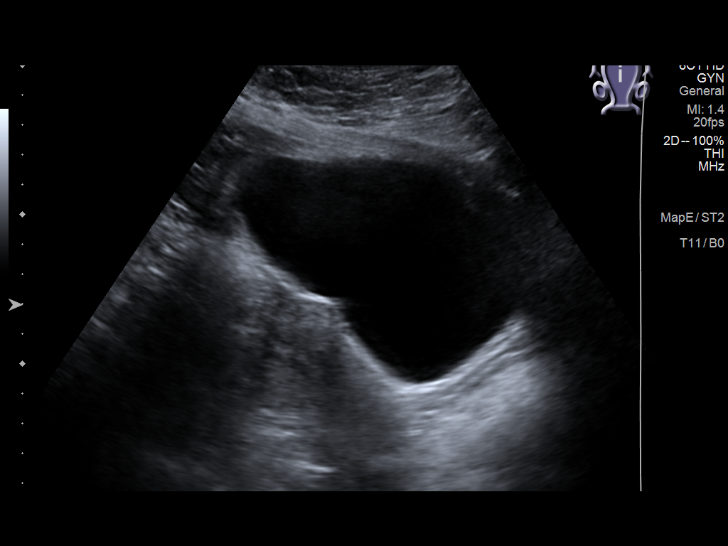
[im 7/77]
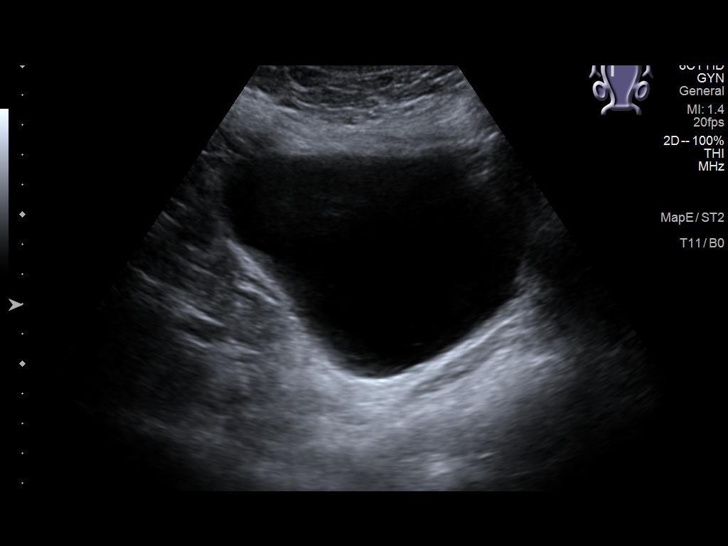
[im 13/77]
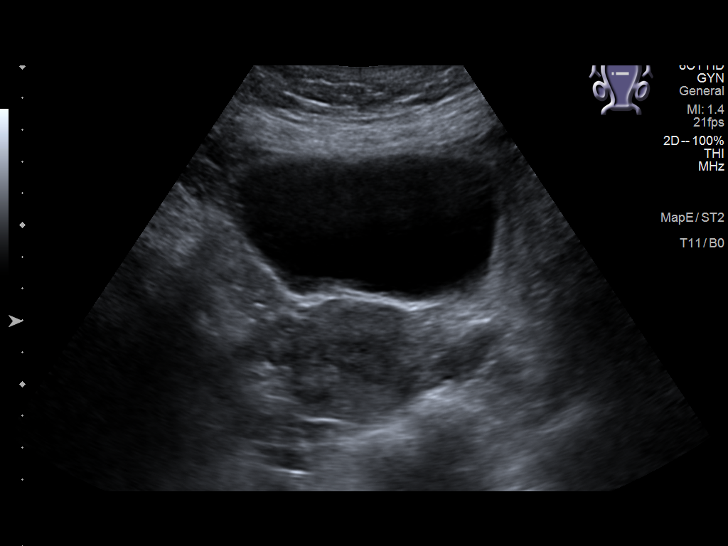
[im 20/77]
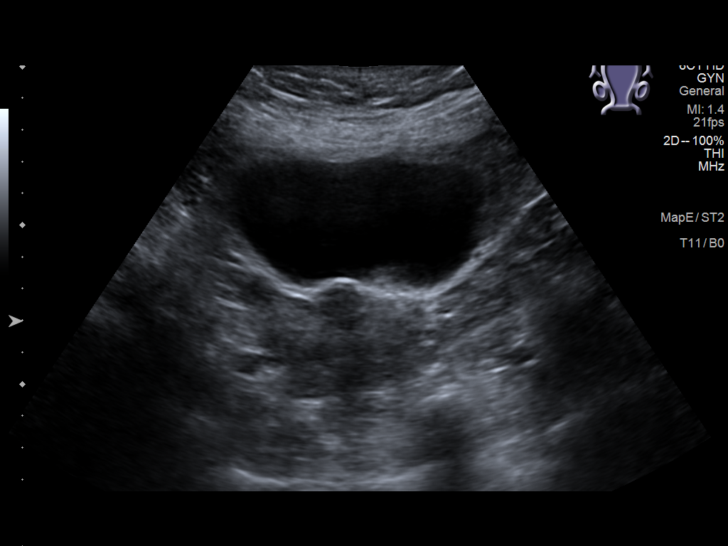
[im 26/77]
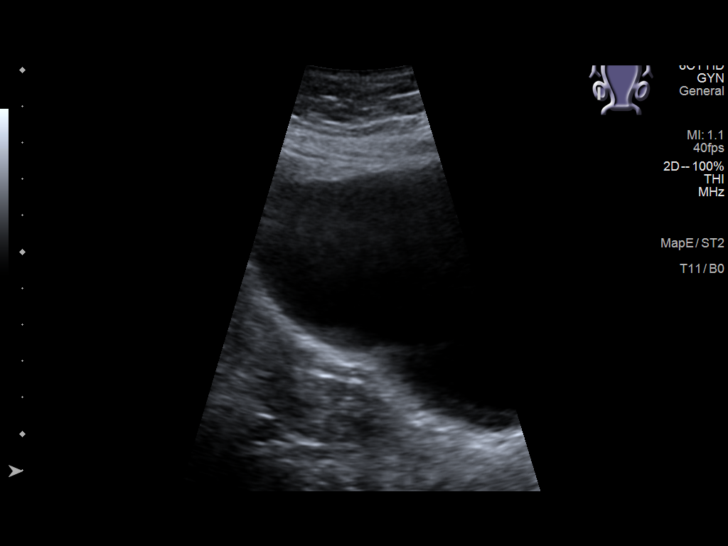
[im 29/77]
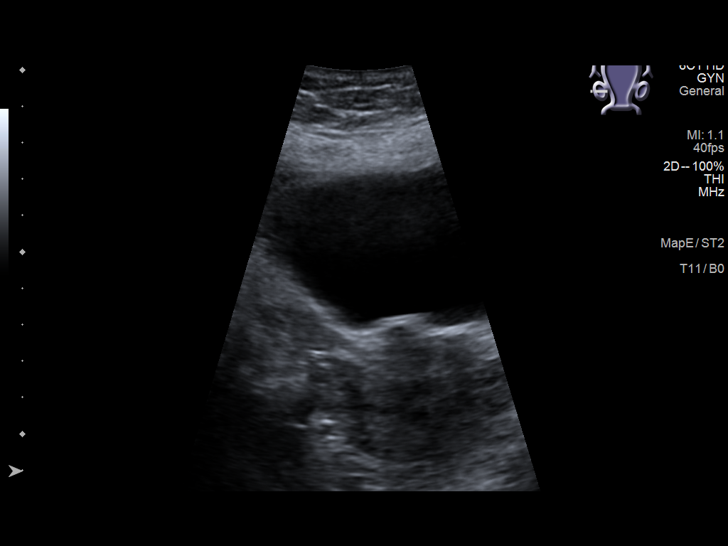
[im 35/77]
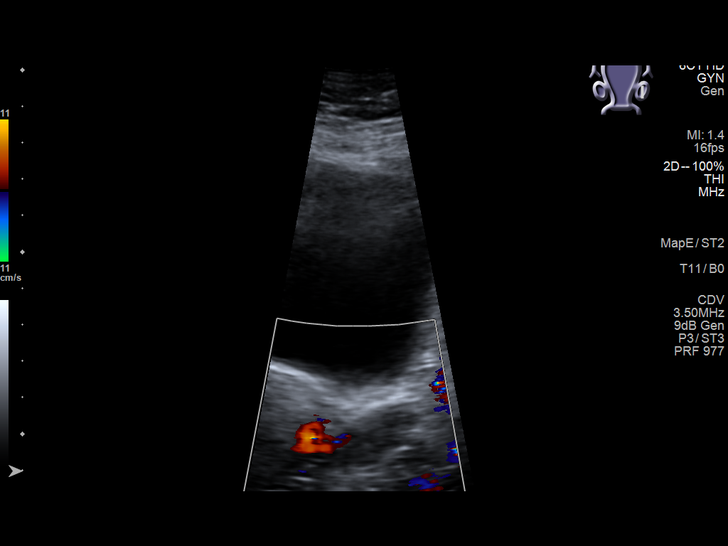
[im 42/77]
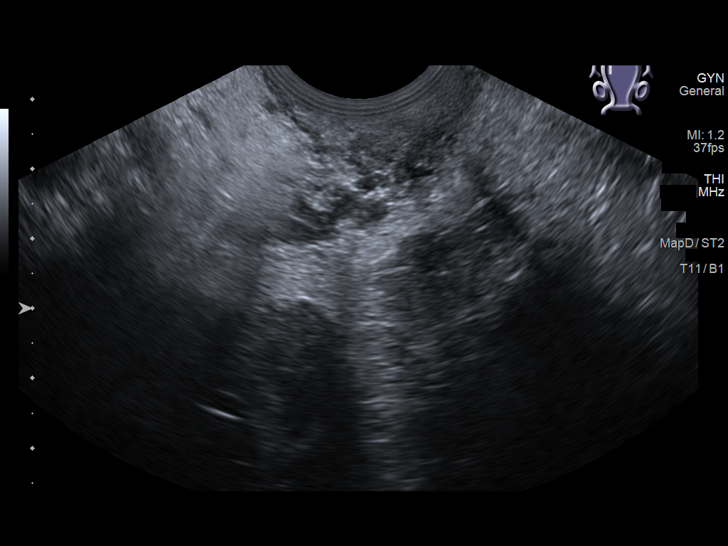
[im 48/77]
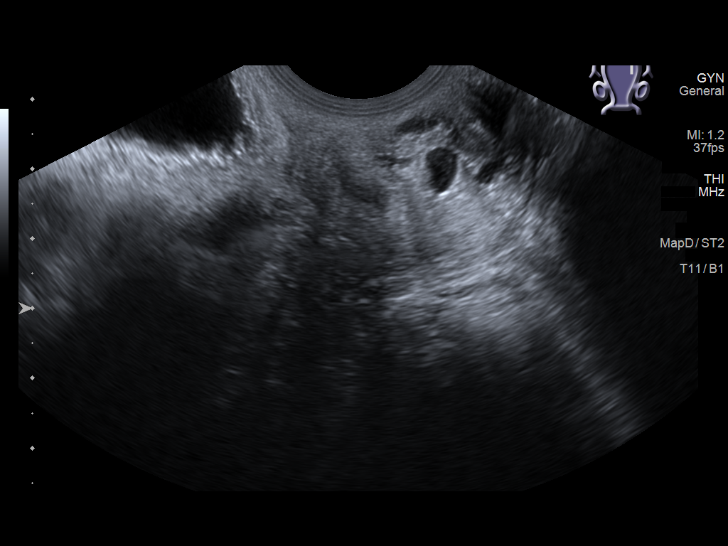
[im 51/77]
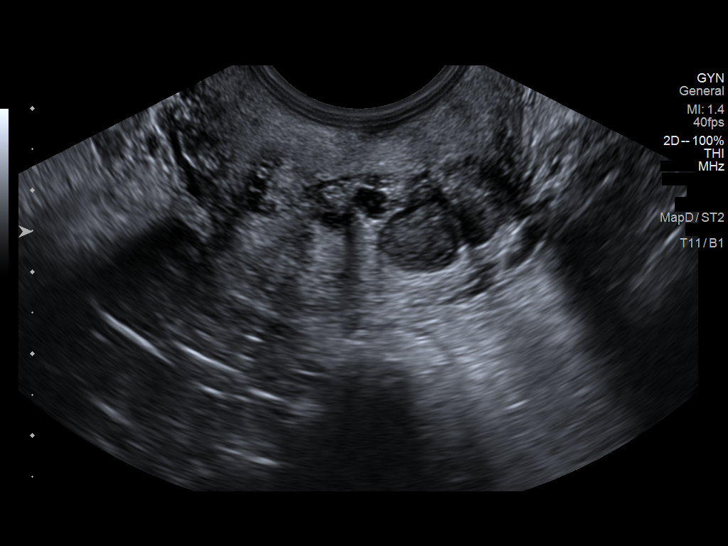
[im 58/77]
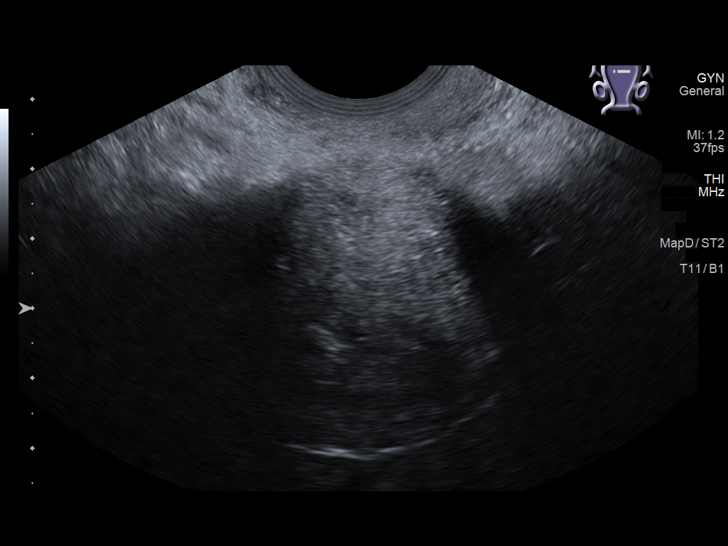
[im 64/77]
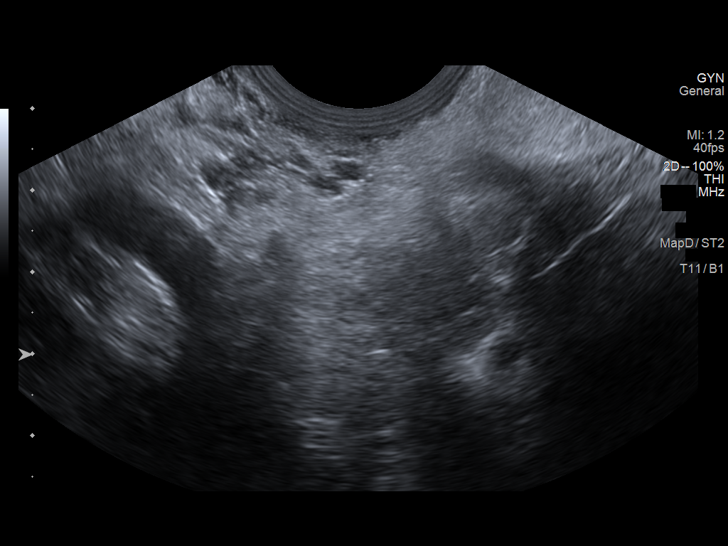
[im 70/77]
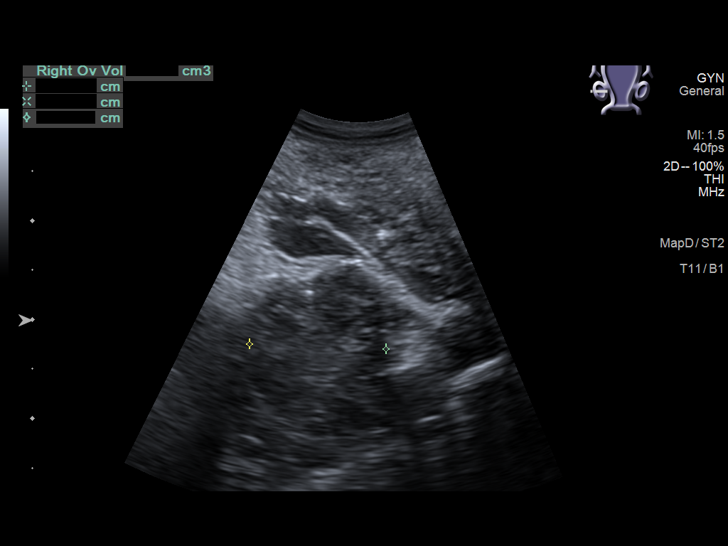
[im 77/77]
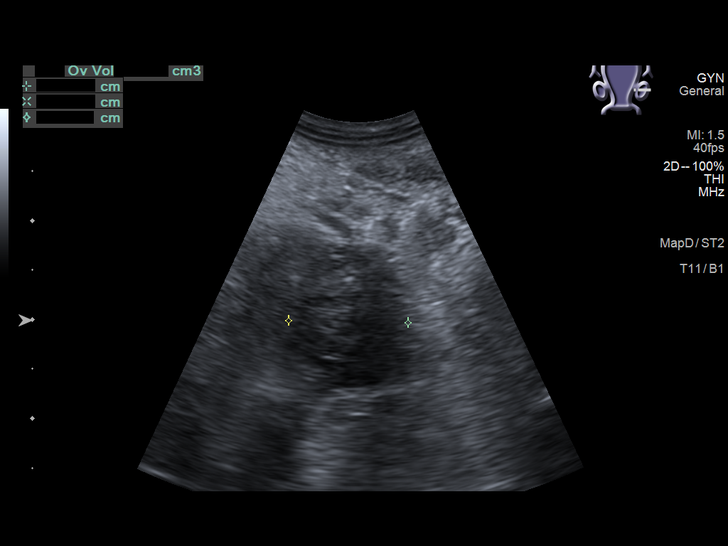

[14 of 25 positions shown; findings below may reference images not displayed]

FINDINGS: Uterus

Measurements: 7.6 x 3.6 x 4.5 cm = volume: 63.1 mL. Small exophytic
fibroid at the uterine fundus measuring 15 x 14 x 16 mm. Complex
nabothian cysts in the cervix

Endometrium

Thickness: 3.3 mm.  No focal abnormality visualized.

Right ovary

Measurements: 2 x 0.9 x 1.4 cm = volume: 1.4 mL. Normal
appearance/no adnexal mass.

Left ovary

Measurements: 2 x 1.2 x 1.2 cm = volume: 1.6 mL. Normal
appearance/no adnexal mass.

Other findings

No abnormal free fluid.
IMPRESSION: Small uterine fibroid.  Otherwise negative pelvic ultrasound.

## 2023-04-05 ENCOUNTER — Ambulatory Visit
Admission: RE | Admit: 2023-04-05 | Discharge: 2023-04-05 | Disposition: A | Discharge status: Home / Self Care | Payer: BC Managed Care – PPO | Source: Ambulatory Visit | Attending: Family Medicine | Admitting: Family Medicine

## 2023-04-05 DIAGNOSIS — Z1231 Encounter for screening mammogram for malignant neoplasm of breast: Secondary | ICD-10-CM

## 2023-04-10 ENCOUNTER — Encounter: Payer: Self-pay | Admitting: Internal Medicine

## 2023-04-24 ENCOUNTER — Encounter: Payer: 59 | Admitting: Dermatology

## 2023-04-25 ENCOUNTER — Encounter: Payer: 59 | Admitting: Dermatology

## 2023-05-06 ENCOUNTER — Other Ambulatory Visit: Payer: Self-pay | Admitting: Internal Medicine

## 2023-05-07 ENCOUNTER — Ambulatory Visit: Payer: BC Managed Care – PPO | Admitting: Dermatology

## 2023-05-07 ENCOUNTER — Encounter: Payer: Self-pay | Admitting: Dermatology

## 2023-05-07 DIAGNOSIS — Z1283 Encounter for screening for malignant neoplasm of skin: Secondary | ICD-10-CM | POA: Diagnosis not present

## 2023-05-07 DIAGNOSIS — L814 Other melanin hyperpigmentation: Secondary | ICD-10-CM

## 2023-05-07 DIAGNOSIS — D225 Melanocytic nevi of trunk: Secondary | ICD-10-CM

## 2023-05-07 DIAGNOSIS — D492 Neoplasm of unspecified behavior of bone, soft tissue, and skin: Secondary | ICD-10-CM

## 2023-05-07 DIAGNOSIS — W908XXA Exposure to other nonionizing radiation, initial encounter: Secondary | ICD-10-CM

## 2023-05-07 DIAGNOSIS — L578 Other skin changes due to chronic exposure to nonionizing radiation: Secondary | ICD-10-CM

## 2023-05-07 DIAGNOSIS — D229 Melanocytic nevi, unspecified: Secondary | ICD-10-CM

## 2023-05-07 DIAGNOSIS — L821 Other seborrheic keratosis: Secondary | ICD-10-CM

## 2023-05-07 DIAGNOSIS — D489 Neoplasm of uncertain behavior, unspecified: Secondary | ICD-10-CM

## 2023-05-07 DIAGNOSIS — Z86018 Personal history of other benign neoplasm: Secondary | ICD-10-CM

## 2023-05-07 DIAGNOSIS — D1801 Hemangioma of skin and subcutaneous tissue: Secondary | ICD-10-CM

## 2023-05-07 NOTE — Progress Notes (Signed)
Follow-Up Visit   Subjective  Mary Schaefer is a 62 y.o. female who presents for the following: Skin Cancer Screening and Full Body Skin Exam  The patient presents for Total-Body Skin Exam (TBSE) for skin cancer screening and mole check. The patient has spots, moles and lesions to be evaluated, some may be new or changing and the patient may have concern these could be cancer.  Patient with hx of dysplastic nevi.   The following portions of the chart were reviewed this encounter and updated as appropriate: medications, allergies, medical history  Review of Systems:  No other skin or systemic complaints except as noted in HPI or Assessment and Plan.  Objective  Well appearing patient in no apparent distress; mood and affect are within normal limits.  A full examination was performed including scalp, head, eyes, ears, nose, lips, neck, chest, axillae, abdomen, back, buttocks, bilateral upper extremities, bilateral lower extremities, hands, feet, fingers, toes, fingernails, and toenails. All findings within normal limits unless otherwise noted below.   Relevant physical exam findings are noted in the Assessment and Plan.  left lateral buttock 9 x 5 mm pigmented thin papule       Assessment & Plan   SKIN CANCER SCREENING PERFORMED TODAY.  ACTINIC DAMAGE - Chronic condition, secondary to cumulative UV/sun exposure - diffuse scaly erythematous macules with underlying dyspigmentation - Recommend daily broad spectrum sunscreen SPF 30+ to sun-exposed areas, reapply every 2 hours as needed.  - Staying in the shade or wearing long sleeves, sun glasses (UVA+UVB protection) and wide brim hats (4-inch brim around the entire circumference of the hat) are also recommended for sun protection.  - Call for new or changing lesions.  LENTIGINES, SEBORRHEIC KERATOSES, HEMANGIOMAS - Benign normal skin lesions - Benign-appearing - Call for any changes  MELANOCYTIC NEVI - Tan-brown and/or  pink-flesh-colored symmetric macules and papules - Benign appearing on exam today - Observation - Call clinic for new or changing moles - Recommend daily use of broad spectrum spf 30+ sunscreen to sun-exposed areas.   History of Dysplastic Nevi - No evidence of recurrence today at left lateral thigh, excised 04/26/2022 - Recommend regular full body skin exams - Recommend daily broad spectrum sunscreen SPF 30+ to sun-exposed areas, reapply every 2 hours as needed.  - Call if any new or changing lesions are noted between office visits     Neoplasm of uncertain behavior left lateral buttock  Skin / nail biopsy Type of biopsy: tangential   Informed consent: discussed and consent obtained   Timeout: patient name, date of birth, surgical site, and procedure verified   Procedure prep:  Patient was prepped and draped in usual sterile fashion Prep type:  Isopropyl alcohol Anesthesia: the lesion was anesthetized in a standard fashion   Anesthetic:  1% lidocaine w/ epinephrine 1-100,000 buffered w/ 8.4% NaHCO3 Instrument used: DermaBlade   Hemostasis achieved with: pressure and aluminum chloride   Outcome: patient tolerated procedure well   Post-procedure details: sterile dressing applied and wound care instructions given   Dressing type: bandage and petrolatum    Specimen 1 - Surgical pathology Differential Diagnosis: r/o Melanoma  Check Margins: No 9 x 5 mm pigmented thin papule  Multiple benign nevi  Lentigines  Actinic elastosis  Seborrheic keratoses  Cherry angioma   Return in about 1 year (around 05/06/2024) for TBSE, Hx Dysplastic Nevi, with Dr. Katrinka Blazing.  Anise Salvo, RMA, am acting as scribe for Elie Goody, MD .   Documentation: I have reviewed  the above documentation for accuracy and completeness, and I agree with the above.  Elie Goody, MD

## 2023-05-07 NOTE — Patient Instructions (Signed)
Wound Care Instructions  Cleanse wound gently with soap and water once a day then pat dry with clean gauze. Apply a thin coat of Petrolatum (petroleum jelly, "Vaseline") over the wound (unless you have an allergy to this). We recommend that you use a new, sterile tube of Vaseline. Do not pick or remove scabs. Do not remove the yellow or white "healing tissue" from the base of the wound.  Cover the wound with fresh, clean, nonstick gauze and secure with paper tape. You may use Band-Aids in place of gauze and tape if the wound is small enough, but would recommend trimming much of the tape off as there is often too much. Sometimes Band-Aids can irritate the skin.  You should call the office for your biopsy report after 1 week if you have not already been contacted.  If you experience any problems, such as abnormal amounts of bleeding, swelling, significant bruising, significant pain, or evidence of infection, please call the office immediately.  FOR ADULT SURGERY PATIENTS: If you need something for pain relief you may take 1 extra strength Tylenol (acetaminophen) AND 2 Ibuprofen (200mg  each) together every 4 hours as needed for pain. (do not take these if you are allergic to them or if you have a reason you should not take them.) Typically, you may only need pain medication for 1 to 3 days.   Melanoma ABCDEs  Melanoma is the most dangerous type of skin cancer, and is the leading cause of death from skin disease.  You are more likely to develop melanoma if you: Have light-colored skin, light-colored eyes, or red or blond hair Spend a lot of time in the sun Tan regularly, either outdoors or in a tanning bed Have had blistering sunburns, especially during childhood Have a close family member who has had a melanoma Have atypical moles or large birthmarks  Early detection of melanoma is key since treatment is typically straightforward and cure rates are extremely high if we catch it early.   The  first sign of melanoma is often a change in a mole or a new dark spot.  The ABCDE system is a way of remembering the signs of melanoma.  A for asymmetry:  The two halves do not match. B for border:  The edges of the growth are irregular. C for color:  A mixture of colors are present instead of an even brown color. D for diameter:  Melanomas are usually (but not always) greater than 6mm - the size of a pencil eraser. E for evolution:  The spot keeps changing in size, shape, and color.  Please check your skin once per month between visits. You can use a small mirror in front and a large mirror behind you to keep an eye on the back side or your body.   If you see any new or changing lesions before your next follow-up, please call to schedule a visit.  Please continue daily skin protection including broad spectrum sunscreen SPF 30+ to sun-exposed areas, reapplying every 2 hours as needed when you're outdoors.    Due to recent changes in healthcare laws, you may see results of your pathology and/or laboratory studies on MyChart before the doctors have had a chance to review them. We understand that in some cases there may be results that are confusing or concerning to you. Please understand that not all results are received at the same time and often the doctors may need to interpret multiple results in order to provide you  with the best plan of care or course of treatment. Therefore, we ask that you please give [place] 2 business days to thoroughly review all your results before contacting the office for clarification. Should we see a critical lab result, you will be contacted sooner.   If You Need Anything After Your Visit  If you have any questions or concerns for your doctor, please call our main line at [phone] and press option 4 to reach your doctor's medical assistant. If no one answers, please leave a voicemail as directed and we will return your call as soon as possible. Messages left after 4  pm will be answered the following business day.   You may also send [place] a message via MyChart. We typically respond to MyChart messages within 1-2 business days.  For prescription refills, please ask your pharmacy to contact our office. Our fax number is [phone].  If you have an urgent issue when the clinic is closed that cannot wait until the next business day, you can page your doctor at the number below.    Please note that while we do our best to be available for urgent issues outside of office hours, we are not available 24/7.   If you have an urgent issue and are unable to reach [place], you may choose to seek medical care at your doctor's office, retail clinic, urgent care center, or emergency room.  If you have a medical emergency, please immediately call 911 or go to the emergency department.  Pager Numbers  - Dr. Gwen Pounds: [phone]  - Dr. Roseanne Reno: [phone]  - Dr. Katrinka Blazing: [phone]   In the event of inclement weather, please call our main line at [phone] for an update on the status of any delays or closures.  Dermatology Medication Tips: Please keep the boxes that topical medications come in in order to help keep track of the instructions about where and how to use these. Pharmacies typically print the medication instructions only on the boxes and not directly on the medication tubes.   If your medication is too expensive, please contact our office at [phone] option 4 or send [place] a message through MyChart.   We are unable to tell what your co-pay for medications will be in advance as this is different depending on your insurance coverage. However, we may be able to find a substitute medication at lower cost or fill out paperwork to get insurance to cover a needed medication.   If a prior authorization is required to get your medication covered by your insurance company, please allow [place] 1-2 business days to complete this process.  Drug prices often vary  depending on where the prescription is filled and some pharmacies may offer cheaper prices.  The website www.goodrx.com contains coupons for medications through different pharmacies. The prices here do not account for what the cost may be with help from insurance (it may be cheaper with your insurance), but the website can give you the price if you did not use any insurance.  - You can print the associated coupon and take it with your prescription to the pharmacy.  - You may also stop by our office during regular business hours and pick up a GoodRx coupon card.  - If you need your prescription sent electronically to a different pharmacy, notify our office through [facility] or by phone at [phone] option 4.

## 2023-05-08 NOTE — Telephone Encounter (Signed)
Requested medication (s) are due for refill today -yes  Requested medication (s) are on the active medication list -yes  Future visit scheduled -yes  Last refill: 01/11/23 #30  Notes to clinic: non delegated Rx  Requested Prescriptions  Pending Prescriptions Disp Refills   ondansetron (ZOFRAN-ODT) 4 MG disintegrating tablet [Pharmacy Med Name: ONDANSETRON ODT 4MG  TABLETS] 30 tablet 0    Sig: DISSOLVE 1 TABLET(4 MG) ON THE TONGUE EVERY 8 HOURS AS NEEDED FOR NAUSEA OR VOMITING     Not Delegated - Gastroenterology: Antiemetics - ondansetron Failed - 05/06/2023  3:50 PM      Failed - This refill cannot be delegated      Passed - AST in normal range and within 360 days    AST  Date Value Ref Range Status  03/06/2023 19 13 - 35 Final         Passed - ALT in normal range and within 360 days    ALT  Date Value Ref Range Status  03/06/2023 24 7 - 35 U/L Final         Passed - Valid encounter within last 6 months    Recent Outpatient Visits           2 months ago Encounter for general adult medical examination with abnormal findings   [place] [facility] [place], Salvadore Oxford, NP   4 months ago Anal fissure   [place] [facility] [place], [place] W, NP   5 months ago Type 2 diabetes mellitus with hyperglycemia, without long-term current use of insulin [facility])   [place] [facility] [place], Salvadore Oxford, NP   11 months ago Tension headache   [facility] [facility] [place], Salvadore Oxford, NP   1 year ago Encounter for general adult medical examination with abnormal findings   [place] [facility] [place], Salvadore Oxford, NP       Future Appointments             In 1 month Baity, Salvadore Oxford, NP  [facility]   In 12 months Elie Goody, MD [facility]               Requested Prescriptions  Pending Prescriptions Disp Refills   ondansetron (ZOFRAN-ODT)  4 MG disintegrating tablet [Pharmacy Med Name: ONDANSETRON ODT 4MG  TABLETS] 30 tablet 0    Sig: DISSOLVE 1 TABLET(4 MG) ON THE TONGUE EVERY 8 HOURS AS NEEDED FOR NAUSEA OR VOMITING     Not Delegated - Gastroenterology: Antiemetics - ondansetron Failed - 05/06/2023  3:50 PM      Failed - This refill cannot be delegated      Passed - AST in normal range and within 360 days    AST  Date Value Ref Range Status  03/06/2023 19 13 - 35 Final         Passed - ALT in normal range and within 360 days    ALT  Date Value Ref Range Status  03/06/2023 24 7 - 35 U/L Final         Passed - Valid encounter within last 6 months    Recent Outpatient Visits           2 months ago Encounter for general adult medical examination with abnormal findings    [facility] [place], Salvadore Oxford, NP   4 months ago Anal fissure   [facility] [place], [place], NP   5 months ago Type 2 diabetes mellitus with hyperglycemia, without long-term current use of insulin [facility])   [place] [facility] [place], Salvadore Oxford, NP   11 months ago Tension headache   [place] [facility] [place], Salvadore Oxford, NP   1 year ago Encounter for general adult medical examination with abnormal findings   [facility] [place], Salvadore Oxford, NP       Future Appointments             In 1 month Baity, Salvadore Oxford, NP [facility] [place]   In 12 months Elie Goody, MD [facility]

## 2023-05-09 LAB — SURGICAL PATHOLOGY

## 2023-05-11 ENCOUNTER — Telehealth: Payer: BC Managed Care – PPO | Admitting: Physician Assistant

## 2023-05-11 DIAGNOSIS — T8149XA Infection following a procedure, other surgical site, initial encounter: Secondary | ICD-10-CM

## 2023-05-11 MED ORDER — DOXYCYCLINE HYCLATE 100 MG PO TABS
100.0000 mg | ORAL_TABLET | Freq: Two times a day (BID) | ORAL | 0 refills | Status: DC
Start: 2023-05-11 — End: 2023-06-08

## 2023-05-11 NOTE — Patient Instructions (Signed)
Mary Schaefer, thank you for joining [name], PA-C for today's virtual visit.  While this provider is not your primary care provider (PCP), if your PCP is located in our provider database this encounter information will be shared with them immediately following your visit.   A [facility] MyChart account gives you access to today's visit and all your visits, tests, and labs performed at [facility] " click here if you don't have a [facility] MyChart account or go to [email or web address]  Consent: (Patient) Mary Schaefer provided verbal consent for this virtual visit at the beginning of the encounter.  Current Medications:  Current Outpatient Medications:    doxycycline (VIBRA-TABS) 100 MG tablet, Take 1 tablet (100 mg total) by mouth 2 (two) times daily., Disp: 20 tablet, Rfl: 0   acetaminophen (TYLENOL) 500 MG tablet, Take 1,000 mg by mouth as needed for mild pain., Disp: , Rfl:    Ascorbic Acid (VITAMIN C PO), Take 1 tablet by mouth daily., Disp: , Rfl:    Blood Glucose Monitoring Suppl (ONETOUCH VERIO REFLECT) w/Device KIT, 1 Device by Does not apply route 3 (three) times daily., Disp: 1 kit, Rfl: 0   Calcium Carbonate-Vitamin D3 600-400 MG-UNIT TABS, Take by mouth., Disp: , Rfl:    cyanocobalamin 2000 MCG tablet, Take 2,000 mcg by mouth 2 (two) times daily., Disp: , Rfl:    ezetimibe (ZETIA) 10 MG tablet, TAKE 1 TABLET BY MOUTH EVERY DAY, Disp: 90 tablet, Rfl: 1   gabapentin (NEURONTIN) 100 MG capsule, Take 1 capsule (100 mg total) by mouth at bedtime., Disp: 90 capsule, Rfl: 1   glucose blood (ONETOUCH VERIO) test strip, Check blood sugar 6 x daily. DX E11.9, Disp: 200 each, Rfl: 3   hydrocortisone-pramoxine (PROCTOFOAM HC) rectal foam, Place 1 applicator rectally 2 (two) times daily., Disp: 10 g, Rfl: 1   hydrOXYzine (VISTARIL) 25 MG capsule, TAKE 1 CAPSULE (25 MG TOTAL) BY MOUTH DAILY AS NEEDED., Disp: 90 capsule, Rfl: 1   Lancets (ONETOUCH ULTRASOFT)  lancets, Use as instructed, Disp: 100 each, Rfl: 12   levothyroxine (SYNTHROID) 112 MCG tablet, TAKE 1 TABLET BY MOUTH EVERY DAY NEED APPT FOR LABS, Disp: 90 tablet, Rfl: 0   lisinopril-hydrochlorothiazide (ZESTORETIC) 10-12.5 MG tablet, Take 1 tablet by mouth daily., Disp: 90 tablet, Rfl: 1   metFORMIN (GLUCOPHAGE-XR) 500 MG 24 hr tablet, TAKE 2 TABLETS(1000 MG) BY MOUTH TWICE DAILY WITH A MEAL, Disp: 120 tablet, Rfl: 1   Nitroglycerin 0.4 % OINT, Apply 1 inch (375 mg) ointment intra-anally every 12 hours for anal fissure, Disp: 30 g, Rfl: 0   omeprazole (PRILOSEC) 10 MG capsule, Take 10 mg by mouth daily., Disp: , Rfl:    ondansetron (ZOFRAN-ODT) 4 MG disintegrating tablet, DISSOLVE 1 TABLET(4 MG) ON THE TONGUE EVERY 8 HOURS AS NEEDED FOR NAUSEA OR VOMITING, Disp: 30 tablet, Rfl: 0   OZEMPIC, 1 MG/DOSE, 4 MG/3ML SOPN, Inject 1 mg into the skin once a week., Disp: 9 mL, Rfl: 0   prednisoLONE acetate (PRED FORTE) 1 % ophthalmic suspension, INSTILL 1 DROP INTO LEFT EYE 4 TIMES A DAY, Disp: , Rfl:    promethazine (PHENERGAN) 25 MG tablet, Take 1 tablet (25 mg total) by mouth every 8 (eight) hours as needed for nausea or vomiting., Disp: 30 tablet, Rfl: 0   rosuvastatin (CRESTOR) 10 MG tablet, TAKE 1 TABLET BY MOUTH EVERY DAY, Disp: 90 tablet, Rfl: 0   Medications ordered in this encounter:  Meds ordered this encounter  Medications   doxycycline (VIBRA-TABS) 100 MG tablet    Sig: Take 1 tablet (100 mg total) by mouth 2 (two) times daily.    Dispense:  20 tablet    Refill:  0    Order Specific Question:   Supervising Provider    Answer:   Merrilee Jansky [id number]     *If you need refills on other medications prior to your next appointment, please contact your pharmacy*  Follow-Up: Call back or seek an in-person evaluation if the symptoms worsen or if the condition fails to improve as anticipated.  [facility] [phone]  Other Instructions Wound Care, Adult Taking care  of your wound properly can help to prevent pain, infection, and scarring. It can also help your wound heal more quickly. Follow instructions from your health care provider about how to care for your wound. Supplies needed: Soap and water. Wound cleanser, saline, or germ-free (sterile) water. Gauze. If needed, a clean bandage (dressing) or other type of wound dressing material to cover or place in the wound. Follow your health care provider's instructions about what dressing supplies to use. Cream or topical ointment to apply to the wound, if told by your health care provider. How to care for your wound Cleaning the wound Ask your health care provider how to clean the wound. This may include: Using mild soap and water, a wound cleanser, saline, or sterile water. Using a clean gauze to pat the wound dry after cleaning it. Do not rub or scrub the wound. Dressing care Wash your hands with soap and water for at least 20 seconds before and after you change the dressing. If soap and water are not available, use hand sanitizer. Change your dressing as told by your health care provider. This may include: Cleaning or rinsing out (irrigating) the wound. Application of cream or topical ointment, if told by your health care provider. Placing a dressing over the wound or in the wound (packing). Covering the wound with an outer dressing. Leave stitches (sutures), staples, skin glue, or adhesive strips in place. These skin closures may need to stay in place for 2 weeks or longer. If adhesive strip edges start to loosen and curl up, you may trim the loose edges. Do not remove adhesive strips completely unless your health care provider tells you to do that. Ask your health care provider when you can leave the wound uncovered. Checking for infection Check your wound area every day for signs of infection. Check for: More redness, swelling, or pain. Fluid or blood. Warmth. Pus or a bad smell.  Follow these  instructions at home Medicines If you were prescribed an antibiotic medicine, cream, or ointment, take or apply it as told by your health care provider. Do not stop using the antibiotic even if your condition improves. If you were prescribed pain medicine, take it 30 minutes before you do any wound care or as told by your health care provider. Take over-the-counter and prescription medicines only as told by your health care provider. Eating and drinking Eat a diet that includes protein, vitamin A, vitamin C, and other nutrient-rich foods to help the wound heal. Foods rich in protein include meat, fish, eggs, dairy, beans, and nuts. Foods rich in vitamin A include carrots and dark green, leafy vegetables. Foods rich in vitamin C include citrus fruits, tomatoes, broccoli, and peppers. Drink enough fluid to keep your urine pale yellow. General instructions Do not take baths, swim, or use a hot tub  until your health care provider approves. Ask your health care provider if you may take showers. You may only be allowed to take sponge baths. Do not scratch or pick at the wound. Keep it covered as told by your health care provider. Return to your normal activities as told by your health care provider. Ask your health care provider what activities are safe for you. Protect your wound from the sun when you are outside for the first 6 months, or for as long as told by your health care provider. Cover up the scar area or apply sunscreen that has an SPF of at least 30. Do not use any products that contain nicotine or tobacco. These products include cigarettes, chewing tobacco, and vaping devices, such as e-cigarettes. If you need help quitting, ask your health care provider. Keep all follow-up visits. This is important. Contact a health care provider if: You received a tetanus shot and you have swelling, severe pain, redness, or bleeding at the injection site. Your pain is not controlled with medicine. You  have any of these signs of infection: More redness, swelling, or pain around the wound. Fluid or blood coming from the wound. Warmth coming from the wound. A fever or chills. You are nauseous or you vomit. You are dizzy. You have a new rash or hardness around the wound. Get help right away if: You have a red streak of skin near the area around your wound. Pus or a bad smell coming from the wound. Your wound has been closed with staples, sutures, skin glue, or adhesive strips and it begins to open up and separate. Your wound is bleeding, and the bleeding does not stop with gentle pressure. These symptoms may represent a serious problem that is an emergency. Do not wait to see if the symptoms will go away. Get medical help right away. Call your local emergency services (911 in the [place] Do not drive yourself to the hospital. Summary Always wash your hands with soap and water for at least 20 seconds before and after changing your dressing. Change your dressing as told by your health care provider. To help with healing, eat foods that are rich in protein, vitamin A, vitamin C, and other nutrients. Check your wound every day for signs of infection. Contact your health care provider if you think that your wound is infected. This information is not intended to replace advice given to you by your health care provider. Make sure you discuss any questions you have with your health care provider. Document Revised: 10/05/2020 Document Reviewed: 10/05/2020 Elsevier Patient Education  2024 [employer]    If you have been instructed to have an in-person evaluation today at a local Urgent Care facility, please use the link below. It will take you to a list of all of our available [facility], including address, phone number and hours of operation. Please do not delay care.  [facility]  If you or a family member do not have a primary care provider, use the link below to  schedule a visit and establish care. When you choose a [facility] primary care physician or advanced practice provider, you gain a long-term partner in health. Find a Primary Care Provider  Learn more about [facility]'s in-office and virtual care options:  - Get Care Now

## 2023-05-11 NOTE — Progress Notes (Signed)
Virtual Visit Consent   Mary Schaefer, you are scheduled for a virtual visit with a [facility] provider today. Just as with appointments in the office, your consent must be obtained to participate. Your consent will be active for this visit and any virtual visit you may have with one of our providers in the next 365 days. If you have a MyChart account, a copy of this consent can be sent to you electronically.  As this is a virtual visit, video technology does not allow for your provider to perform a traditional examination. This may limit your provider's ability to fully assess your condition. If your provider identifies any concerns that need to be evaluated in person or the need to arrange testing (such as labs, EKG, etc.), we will make arrangements to do so. Although advances in technology are sophisticated, we cannot ensure that it will always work on either your end or our end. If the connection with a video visit is poor, the visit may have to be switched to a telephone visit. With either a video or telephone visit, we are not always able to ensure that we have a secure connection.  By engaging in this virtual visit, you consent to the provision of healthcare and authorize for your insurance to be billed (if applicable) for the services provided during this visit. Depending on your insurance coverage, you may receive a charge related to this service.  I need to obtain your verbal consent now. Are you willing to proceed with your visit today? Mary Schaefer has provided verbal consent on 05/11/2023 for a virtual visit (video or telephone). [name], PA-C  Date: 05/11/2023 8:19 AM  Virtual Visit via Video Note   I, [name], connected with  Mary Schaefer  ([id number], 1960/12/17) on 05/11/23 at  8:15 AM EST by a video-enabled telemedicine application and verified that I am speaking with the correct person using two identifiers.  Location: Patient: Virtual Visit  Location Patient: Home Provider: Virtual Visit Location Provider: Home Office   I discussed the limitations of evaluation and management by telemedicine and the availability of in person appointments. The patient expressed understanding and agreed to proceed.    History of Present Illness: Mary Schaefer is a 62 y.o. who identifies as a female who was assigned female at birth, and is being seen today for possible infection from an area on the left hip where a mole was surgically removed on 05/07/23 by her Dermatologist.   Has not had any issues until yesterday it became very tender to touch, mild redness surrounding, and feels warm and hard around the wound. Had trouble sleeping due to pain when she would roll over on it accidentally. Denies fevers, chills, nausea, vomiting, drainage, fluctuance.    Problems:  Patient Active Problem List   Diagnosis Date Noted   Aortic atherosclerosis (HCC) 04/06/2021   Overweight with body mass index (BMI) of 26 to 26.9 in adult 02/10/2021   GERD (gastroesophageal reflux disease) 07/19/2020   DM (diabetes mellitus), type 2 (HCC) 02/12/2019   Hyperlipidemia associated with type 2 diabetes mellitus (HCC) 09/06/2017   Essential hypertension 03/31/2016   Acquired hypothyroidism 03/31/2016   Generalized anxiety disorder 03/31/2016    Allergies:  Allergies  Allergen Reactions   Aspirin Shortness Of Breath and Hives   Penicillins Hives   Medications:  Current Outpatient Medications:    doxycycline (VIBRA-TABS) 100 MG tablet, Take 1 tablet (100 mg total) by mouth 2 (two) times  daily., Disp: 20 tablet, Rfl: 0   acetaminophen (TYLENOL) 500 MG tablet, Take 1,000 mg by mouth as needed for mild pain., Disp: , Rfl:    Ascorbic Acid (VITAMIN C PO), Take 1 tablet by mouth daily., Disp: , Rfl:    Blood Glucose Monitoring Suppl (ONETOUCH VERIO REFLECT) w/Device KIT, 1 Device by Does not apply route 3 (three) times daily., Disp: 1 kit, Rfl: 0   Calcium  Carbonate-Vitamin D3 600-400 MG-UNIT TABS, Take by mouth., Disp: , Rfl:    cyanocobalamin 2000 MCG tablet, Take 2,000 mcg by mouth 2 (two) times daily., Disp: , Rfl:    ezetimibe (ZETIA) 10 MG tablet, TAKE 1 TABLET BY MOUTH EVERY DAY, Disp: 90 tablet, Rfl: 1   gabapentin (NEURONTIN) 100 MG capsule, Take 1 capsule (100 mg total) by mouth at bedtime., Disp: 90 capsule, Rfl: 1   glucose blood (ONETOUCH VERIO) test strip, Check blood sugar 6 x daily. DX E11.9, Disp: 200 each, Rfl: 3   hydrocortisone-pramoxine (PROCTOFOAM HC) rectal foam, Place 1 applicator rectally 2 (two) times daily., Disp: 10 g, Rfl: 1   hydrOXYzine (VISTARIL) 25 MG capsule, TAKE 1 CAPSULE (25 MG TOTAL) BY MOUTH DAILY AS NEEDED., Disp: 90 capsule, Rfl: 1   Lancets (ONETOUCH ULTRASOFT) lancets, Use as instructed, Disp: 100 each, Rfl: 12   levothyroxine (SYNTHROID) 112 MCG tablet, TAKE 1 TABLET BY MOUTH EVERY DAY NEED APPT FOR LABS, Disp: 90 tablet, Rfl: 0   lisinopril-hydrochlorothiazide (ZESTORETIC) 10-12.5 MG tablet, Take 1 tablet by mouth daily., Disp: 90 tablet, Rfl: 1   metFORMIN (GLUCOPHAGE-XR) 500 MG 24 hr tablet, TAKE 2 TABLETS(1000 MG) BY MOUTH TWICE DAILY WITH A MEAL, Disp: 120 tablet, Rfl: 1   Nitroglycerin 0.4 % OINT, Apply 1 inch (375 mg) ointment intra-anally every 12 hours for anal fissure, Disp: 30 g, Rfl: 0   omeprazole (PRILOSEC) 10 MG capsule, Take 10 mg by mouth daily., Disp: , Rfl:    ondansetron (ZOFRAN-ODT) 4 MG disintegrating tablet, DISSOLVE 1 TABLET(4 MG) ON THE TONGUE EVERY 8 HOURS AS NEEDED FOR NAUSEA OR VOMITING, Disp: 30 tablet, Rfl: 0   OZEMPIC, 1 MG/DOSE, 4 MG/3ML SOPN, Inject 1 mg into the skin once a week., Disp: 9 mL, Rfl: 0   prednisoLONE acetate (PRED FORTE) 1 % ophthalmic suspension, INSTILL 1 DROP INTO LEFT EYE 4 TIMES A DAY, Disp: , Rfl:    promethazine (PHENERGAN) 25 MG tablet, Take 1 tablet (25 mg total) by mouth every 8 (eight) hours as needed for nausea or vomiting., Disp: 30 tablet, Rfl: 0    rosuvastatin (CRESTOR) 10 MG tablet, TAKE 1 TABLET BY MOUTH EVERY DAY, Disp: 90 tablet, Rfl: 0  Observations/Objective: Patient is well-developed, well-nourished in no acute distress.  Resting comfortably at home.  Head is normocephalic, atraumatic.  No labored breathing.  Speech is clear and coherent with logical content.  Patient is alert and oriented at baseline.    Assessment and Plan: 1. Postoperative wound cellulitis - doxycycline (VIBRA-TABS) 100 MG tablet; Take 1 tablet (100 mg total) by mouth 2 (two) times daily.  Dispense: 20 tablet; Refill: 0  - Mild erythema, pain to touch, and induration around wound from mole removal - Doxycycline prescribed - Can apply Mupirocin which she already has - Keep wound clean and dry - Wash with warm soap and water once to twice daily - Keep covered with bandage due to location to avoid clothes rubbing the area - Try to avoid direct pressure to the wound - Call Dermatologist  Monday if not improving - Seek immediate in person care if worsening prior to that   Follow Up Instructions: I discussed the assessment and treatment plan with the patient. The patient was provided an opportunity to ask questions and all were answered. The patient agreed with the plan and demonstrated an understanding of the instructions.  A copy of instructions were sent to the patient via MyChart unless otherwise noted below.    The patient was advised to call back or seek an in-person evaluation if the symptoms worsen or if the condition fails to improve as anticipated.    [name], PA-C

## 2023-05-15 ENCOUNTER — Telehealth: Payer: Self-pay

## 2023-05-15 NOTE — Telephone Encounter (Signed)
Called patient. N/A. LMOVM results available on MyChart or she can call our office to discuss.

## 2023-05-15 NOTE — Telephone Encounter (Signed)
-----   Message from [facility] sent at 05/11/2023  5:42 PM EST ----- Diagnosis: left lateral buttock :       DYSPLASTIC COMPOUND NEVUS WITH MILD ATYPIA, DEEP MARGIN INVOLVED    Plan: mychart message

## 2023-05-17 ENCOUNTER — Encounter: Payer: Self-pay | Admitting: Internal Medicine

## 2023-05-17 MED ORDER — OZEMPIC (1 MG/DOSE) 4 MG/3ML ~~LOC~~ SOPN: 1.0000 mg | PEN_INJECTOR | SUBCUTANEOUS | 0 refills | Status: DC

## 2023-05-25 ENCOUNTER — Other Ambulatory Visit: Payer: Self-pay | Admitting: Internal Medicine

## 2023-05-25 DIAGNOSIS — I10 Essential (primary) hypertension: Secondary | ICD-10-CM

## 2023-05-25 NOTE — Telephone Encounter (Signed)
Requested Prescriptions  Pending Prescriptions Disp Refills   lisinopril-hydrochlorothiazide (ZESTORETIC) 10-12.5 MG tablet [Pharmacy Med Name: LISINOPRIL-HCTZ 10/12.5MG  TABLETS] 90 tablet 0    Sig: TAKE 1 TABLET BY MOUTH DAILY     Cardiovascular:  ACEI + Diuretic Combos Failed - 05/25/2023 10:47 AM      Failed - Na in normal range and within 180 days    Sodium  Date Value Ref Range Status  03/06/2023 136 (A) 137 - 147 Final         Passed - K in normal range and within 180 days    Potassium  Date Value Ref Range Status  03/06/2023 4.1 3.5 - 5.1 mEq/L Final         Passed - Cr in normal range and within 180 days    Creatinine  Date Value Ref Range Status  03/06/2023 0.8 0.5 - 1.1 Final   Creat  Date Value Ref Range Status  12/01/2022 0.65 0.50 - 1.05 mg/dL Final   Creatinine,U  Date Value Ref Range Status  02/12/2019 75.8 mg/dL Final   Creatinine, Urine  Date Value Ref Range Status  01/13/2022 115 20 - 275 mg/dL Final         Passed - eGFR is 30 or above and within 180 days    GFR calc Af Amer  Date Value Ref Range Status  09/17/2017 >60 >60 mL/min Final    Comment:    (NOTE) The eGFR has been calculated using the CKD EPI equation. This calculation has not been validated in all clinical situations. eGFR's persistently <60 mL/min signify possible Chronic Kidney Disease.    GFR, Estimated  Date Value Ref Range Status  04/05/2021 >60 >60 mL/min Final    Comment:    (NOTE) Calculated using the CKD-EPI Creatinine Equation (2021)    GFR  Date Value Ref Range Status  07/15/2020 71.39 >60.00 mL/min Final    Comment:    Calculated using the CKD-EPI Creatinine Equation (2021)   eGFR  Date Value Ref Range Status  03/06/2023 83  Final         Passed - Patient is not pregnant      Passed - Last BP in normal range    BP Readings from Last 1 Encounters:  03/08/23 100/62         Passed - Valid encounter within last 6 months    Recent Outpatient Visits            2 months ago Encounter for general adult medical examination with abnormal findings   [place] [facility] [place], Salvadore Oxford, NP   4 months ago Anal fissure   [facility] [place], [place] W, NP   5 months ago Type 2 diabetes mellitus with hyperglycemia, without long-term current use of insulin [facility])   [facility] [place], Salvadore Oxford, NP   1 year ago Tension headache   [place] [facility] [place], Salvadore Oxford, NP   1 year ago Encounter for general adult medical examination with abnormal findings   [place] [facility] [place], Salvadore Oxford, NP       Future Appointments             In 2 weeks Sampson Si, Salvadore Oxford, NP [place] [facility]   In 11 months Elie Goody, MD [facility]

## 2023-06-08 ENCOUNTER — Encounter: Payer: Self-pay | Admitting: Internal Medicine

## 2023-06-08 ENCOUNTER — Ambulatory Visit: Payer: BC Managed Care – PPO | Admitting: Internal Medicine

## 2023-06-08 VITALS — BP 102/70 | Ht 66.0 in | Wt 133.2 lb

## 2023-06-08 DIAGNOSIS — I10 Essential (primary) hypertension: Secondary | ICD-10-CM

## 2023-06-08 DIAGNOSIS — I700 Atherosclerosis of aorta: Secondary | ICD-10-CM | POA: Diagnosis not present

## 2023-06-08 DIAGNOSIS — K219 Gastro-esophageal reflux disease without esophagitis: Secondary | ICD-10-CM

## 2023-06-08 DIAGNOSIS — E039 Hypothyroidism, unspecified: Secondary | ICD-10-CM

## 2023-06-08 DIAGNOSIS — E1169 Type 2 diabetes mellitus with other specified complication: Secondary | ICD-10-CM

## 2023-06-08 DIAGNOSIS — F411 Generalized anxiety disorder: Secondary | ICD-10-CM

## 2023-06-08 DIAGNOSIS — E1141 Type 2 diabetes mellitus with diabetic mononeuropathy: Secondary | ICD-10-CM

## 2023-06-08 DIAGNOSIS — E785 Hyperlipidemia, unspecified: Secondary | ICD-10-CM

## 2023-06-08 LAB — POCT GLYCOSYLATED HEMOGLOBIN (HGB A1C): Hemoglobin A1C: 5.6 % (ref 4.0–5.6)

## 2023-06-08 MED ORDER — HYDROXYZINE PAMOATE 25 MG PO CAPS: 25.0000 mg | ORAL_CAPSULE | Freq: Every day | ORAL | 1 refills | Status: AC | PRN

## 2023-06-08 MED ORDER — SEMAGLUTIDE(0.25 OR 0.5MG/DOS) 2 MG/3ML ~~LOC~~ SOPN: 1.0000 | PEN_INJECTOR | SUBCUTANEOUS | Status: DC

## 2023-06-08 MED ORDER — LISINOPRIL 5 MG PO TABS: 5.0000 mg | ORAL_TABLET | Freq: Every day | ORAL | 1 refills | Status: DC

## 2023-06-08 NOTE — Assessment & Plan Note (Signed)
Having orthostasis on lisinopril HCT, will discontinue Rx for lisinopril 5 mg daily C-Met today

## 2023-06-08 NOTE — Assessment & Plan Note (Addendum)
POCT A1c 5.5% We will check urine microalbumin Ozempic discontinued, she will administer 0.5 mg weekly x 2 weeks then decrease to 0.25 mg weekly x 2 weeks then stop, sample provided in office Continue metformin and gabapentin Encourage routine eye exam Encouraged routine foot exam Immunizations UTD

## 2023-06-08 NOTE — Patient Instructions (Signed)

## 2023-06-08 NOTE — Assessment & Plan Note (Signed)
Stable on hydroxyzine as needed Support offered

## 2023-06-08 NOTE — Assessment & Plan Note (Signed)
C-Met and lipid profile today Encouraged her to consume a low-fat diet Continue rosuvastatin and ezetimibe She is allergic to aspirin

## 2023-06-08 NOTE — Progress Notes (Signed)
Subjective:    Patient ID: Mary Schaefer, female    DOB: 04-13-1961, 62 y.o.   MRN: [id number]  HPI  Patient presents to clinic today for 61-month follow-up of chronic conditions.  HTN: Her BP today is 102/70.  She is taking lisinopril hct as prescribed but admits she has been feeling dizzy lately.  There is no ECG on file.  HLD with aortic atherosclerosis: Her last LDL was 61, triglycerides 784, 11/2022.  She denies myalgias on rosuvastatin and ezetimibe.  She has an allergy to aspirin.  She tries to consume low-fat diet.  GERD: Rare. She is not currently taking any medications for this. Upper GI from 06/2022 reviewed.  GAD: Intermittent, she is not taking buspirone but is taking hydroxyzine as needed.  She has been under a lot of family stress lately.  She is not currently seeing a therapist.  She denies depression, SI/HI.  DM2 with neuropathy: Her last A1c was 7.2%, 02/2023.  She is taking metformin, ozempic and gabapentin as prescribed.  She would like to stop the ozempic due to side effects and weight loss. Her average is 120. She checks her feet routinely.  Her last eye exam was 11/2022.  Flu 02/2023.  Pneumovax 02/2019.  COVID Pfizer x 3.  Hypothyroidism: She denies any issues on her current dose of levothyroxine.  She does not follow with endocrinology.   Review of Systems     Past Medical History:  Diagnosis Date   Blood in stool    Childhood asthma    Diabetes mellitus without complication (HCC)    Dysplastic nevus 03/28/2022   left lateral thigh, excised 04/26/22, margins free   Dysplastic nevus 05/07/2023   Left lateral buttock. Mild atypia. Deep margin involved.   Endometriosis 1997   Hypertension    Thyroid disease     Current Outpatient Medications  Medication Sig Dispense Refill   acetaminophen (TYLENOL) 500 MG tablet Take 1,000 mg by mouth as needed for mild pain.     Ascorbic Acid (VITAMIN C PO) Take 1 tablet by mouth daily.     Blood Glucose Monitoring Suppl  (ONETOUCH VERIO REFLECT) w/Device KIT 1 Device by Does not apply route 3 (three) times daily. 1 kit 0   Calcium Carbonate-Vitamin D3 600-400 MG-UNIT TABS Take by mouth.     cyanocobalamin 2000 MCG tablet Take 2,000 mcg by mouth 2 (two) times daily.     doxycycline (VIBRA-TABS) 100 MG tablet Take 1 tablet (100 mg total) by mouth 2 (two) times daily. 20 tablet 0   ezetimibe (ZETIA) 10 MG tablet TAKE 1 TABLET BY MOUTH EVERY DAY 90 tablet 1   gabapentin (NEURONTIN) 100 MG capsule Take 1 capsule (100 mg total) by mouth at bedtime. 90 capsule 1   glucose blood (ONETOUCH VERIO) test strip Check blood sugar 6 x daily. DX E11.9 200 each 3   hydrocortisone-pramoxine (PROCTOFOAM HC) rectal foam Place 1 applicator rectally 2 (two) times daily. 10 g 1   hydrOXYzine (VISTARIL) 25 MG capsule TAKE 1 CAPSULE (25 MG TOTAL) BY MOUTH DAILY AS NEEDED. 90 capsule 1   Lancets (ONETOUCH ULTRASOFT) lancets Use as instructed 100 each 12   levothyroxine (SYNTHROID) 112 MCG tablet TAKE 1 TABLET BY MOUTH EVERY DAY NEED APPT FOR LABS 90 tablet 0   lisinopril-hydrochlorothiazide (ZESTORETIC) 10-12.5 MG tablet TAKE 1 TABLET BY MOUTH DAILY 90 tablet 0   metFORMIN (GLUCOPHAGE-XR) 500 MG 24 hr tablet TAKE 2 TABLETS(1000 MG) BY MOUTH TWICE DAILY WITH A MEAL  120 tablet 1   Nitroglycerin 0.4 % OINT Apply 1 inch (375 mg) ointment intra-anally every 12 hours for anal fissure 30 g 0   omeprazole (PRILOSEC) 10 MG capsule Take 10 mg by mouth daily.     ondansetron (ZOFRAN-ODT) 4 MG disintegrating tablet DISSOLVE 1 TABLET(4 MG) ON THE TONGUE EVERY 8 HOURS AS NEEDED FOR NAUSEA OR VOMITING 30 tablet 0   OZEMPIC, 1 MG/DOSE, 4 MG/3ML SOPN Inject 1 mg into the skin once a week. 9 mL 0   prednisoLONE acetate (PRED FORTE) 1 % ophthalmic suspension INSTILL 1 DROP INTO LEFT EYE 4 TIMES A DAY     promethazine (PHENERGAN) 25 MG tablet Take 1 tablet (25 mg total) by mouth every 8 (eight) hours as needed for nausea or vomiting. 30 tablet 0    rosuvastatin (CRESTOR) 10 MG tablet TAKE 1 TABLET BY MOUTH EVERY DAY 90 tablet 0   No current facility-administered medications for this visit.    Allergies  Allergen Reactions   Aspirin Shortness Of Breath and Hives   Penicillins Hives    Family History  Problem Relation Age of Onset   Ovarian cancer Mother    Hyperlipidemia Mother    Alcohol abuse Father    Hyperlipidemia Father    Heart disease Father    Stroke Father    Hypertension Father    Diabetes Sister    Colon cancer Neg Hx    Esophageal cancer Neg Hx    Breast cancer Neg Hx     Social History   Socioeconomic History   Marital status: Married    Spouse name: Not on file   Number of children: Not on file   Years of education: Not on file   Highest education level: Not on file  Occupational History   Not on file  Tobacco Use   Smoking status: Never   Smokeless tobacco: Never  Vaping Use   Vaping status: Never Used  Substance and Sexual Activity   Alcohol use: Yes    Alcohol/week: 1.0 - 2.0 standard drink of alcohol    Types: 1 - 2 Glasses of wine per week    Comment: rare   Drug use: Not Currently   Sexual activity: Yes  Other Topics Concern   Not on file  Social History Narrative   ** Merged History Encounter **       Social Drivers of Corporate investment banker Strain: Not on file  Food Insecurity: Not on file  Transportation Needs: Not on file  Physical Activity: Not on file  Stress: Not on file  Social Connections: Not on file  Intimate Partner Violence: Not on file     Constitutional: Denies fever, malaise, fatigue, headache or abrupt weight changes.  HEENT: Denies eye pain, eye redness, ear pain, ringing in the ears, wax buildup, runny nose, nasal congestion, bloody nose, or sore throat. Respiratory: Denies difficulty breathing, shortness of breath, cough or sputum production.   Cardiovascular: Denies chest pain, chest tightness, palpitations or swelling in the hands or feet.   Gastrointestinal: Patient reports nausea and constipation.  Denies abdominal pain, bloating, diarrhea or blood in the stool.  GU: Denies urgency, frequency, pain with urination, burning sensation, blood in urine, odor or discharge. Musculoskeletal: Denies decrease in range of motion, difficulty with gait, muscle pain or joint pain and swelling.  Skin: Denies redness, rashes, lesions or ulcercations.  Neurological: Patient reports neuropathic pain.  Denies dizziness, difficulty with memory, difficulty with speech or problems with  balance and coordination.  Psych: Patient has a history of anxiety.  Denies depression, SI/HI.  No other specific complaints in a complete review of systems (except as listed in HPI above).  Objective:   Physical Exam  BP 102/70 (BP Location: Left Arm, Patient Position: Sitting, Cuff Size: Normal)   Ht 5\' 6"  (1.676 m)   Wt 133 lb 3.2 oz (60.4 kg)   LMP 09/10/2016   BMI 21.50 kg/m    Wt Readings from Last 3 Encounters:  03/08/23 149 lb (67.6 kg)  01/03/23 165 lb (74.8 kg)  12/01/22 167 lb (75.8 kg)    General: Appears her stated age, in NAD. Skin: Warm, dry and intact. No ulcerations noted. HEENT: Head: normal shape and size; Eyes: sclera white, no icterus, conjunctiva pink, PERRLA and EOMs intact;   Neck:  Neck supple, trachea midline. No masses, lumps or thyromegaly present.  Cardiovascular: Normal rate and rhythm. S1,S2 noted.  No murmur, rubs or gallops noted. No JVD or BLE edema. No carotid bruits noted. Pulmonary/Chest: Normal effort and positive vesicular breath sounds. No respiratory distress. No wheezes, rales or ronchi noted.  Abdomen: Normal bowel sounds.  Musculoskeletal:  No difficulty with gait.  Neurological: Alert and oriented. Coordination normal.  Psychiatric: Mood and affect mildly flat. Behavior is normal. Judgment and thought content normal.     BMET    Component Value Date/Time   NA 136 (A) 03/06/2023 0000   K 4.1 03/06/2023  0000   CL 95 (A) 03/06/2023 0000   CO2 24 (A) 03/06/2023 0000   GLUCOSE 203 (H) 12/01/2022 0833   BUN 16 03/06/2023 0000   CREATININE 0.8 03/06/2023 0000   CREATININE 0.65 12/01/2022 0833   CALCIUM 9.9 03/06/2023 0000   GFRNONAA >60 04/05/2021 1500   GFRAA >60 09/17/2017 0732    Lipid Panel     Component Value Date/Time   CHOL 121 03/06/2023 0000   TRIG 169 (A) 03/06/2023 0000   HDL 31 (A) 03/06/2023 0000   CHOLHDL 2.9 12/01/2022 0833   VLDL 43.2 (H) 09/02/2019 0809   LDLCALC 61 03/06/2023 0000   LDLCALC 48 12/01/2022 0833    CBC    Component Value Date/Time   WBC 6.8 12/01/2022 0833   RBC 5.31 (H) 12/01/2022 0833   HGB 15.6 (H) 12/01/2022 0833   HCT 47.6 (H) 12/01/2022 0833   PLT 295 12/01/2022 0833   MCV 89.6 12/01/2022 0833   MCH 29.4 12/01/2022 0833   MCHC 32.8 12/01/2022 0833   RDW 12.5 12/01/2022 0833    Hgb A1C Lab Results  Component Value Date   HGBA1C 7.4 (A) 03/08/2023           Assessment & Plan:     RTC in 3 months for follow-up of chronic conditions Nicki Reaper, NP

## 2023-06-08 NOTE — Assessment & Plan Note (Signed)
TSH and free T4 today We will adjust levothyroxine if needed based on labs 

## 2023-06-08 NOTE — Assessment & Plan Note (Signed)
C-Met and lipid profile today Encouraged to consume a low-fat diet Continue rosuvastatin and ezetimibe

## 2023-06-08 NOTE — Assessment & Plan Note (Signed)
Currently not an issue off meds Discontinue omeprazole

## 2023-06-09 LAB — CBC
HCT: 45.2 % — ABNORMAL HIGH (ref 35.0–45.0)
Hemoglobin: 14.8 g/dL (ref 11.7–15.5)
MCH: 30 pg (ref 27.0–33.0)
MCHC: 32.7 g/dL (ref 32.0–36.0)
MCV: 91.7 fL (ref 80.0–100.0)
MPV: 10.3 fL (ref 7.5–12.5)
Platelets: 316 10*3/uL (ref 140–400)
RBC: 4.93 10*6/uL (ref 3.80–5.10)
RDW: 12 % (ref 11.0–15.0)
WBC: 7.3 10*3/uL (ref 3.8–10.8)

## 2023-06-09 LAB — COMPLETE METABOLIC PANEL WITH GFR
AG Ratio: 2.2 (calc) (ref 1.0–2.5)
ALT: 18 U/L (ref 6–29)
AST: 15 U/L (ref 10–35)
Albumin: 4.8 g/dL (ref 3.6–5.1)
Alkaline phosphatase (APISO): 46 U/L (ref 37–153)
BUN: 15 mg/dL (ref 7–25)
CO2: 30 mmol/L (ref 20–32)
Calcium: 10.1 mg/dL (ref 8.6–10.4)
Chloride: 99 mmol/L (ref 98–110)
Creat: 0.6 mg/dL (ref 0.50–1.05)
Globulin: 2.2 g/dL (ref 1.9–3.7)
Glucose, Bld: 95 mg/dL (ref 65–99)
Potassium: 4.3 mmol/L (ref 3.5–5.3)
Sodium: 140 mmol/L (ref 135–146)
Total Bilirubin: 0.5 mg/dL (ref 0.2–1.2)
Total Protein: 7 g/dL (ref 6.1–8.1)
eGFR: 102 mL/min/{1.73_m2} (ref >=60)

## 2023-06-09 LAB — MICROALBUMIN / CREATININE URINE RATIO
Creatinine, Urine: 199 mg/dL (ref 20–275)
Microalb Creat Ratio: 13 mg/g{creat} (ref <30)
Microalb, Ur: 2.6 mg/dL

## 2023-06-09 LAB — LIPID PANEL
Cholesterol: 116 mg/dL (ref <200)
HDL: 44 mg/dL — ABNORMAL LOW (ref >=50)
LDL Cholesterol (Calc): 55 mg/dL
Non-HDL Cholesterol (Calc): 72 mg/dL (ref <130)
Total CHOL/HDL Ratio: 2.6 (calc) (ref <5.0)
Triglycerides: 87 mg/dL (ref <150)

## 2023-06-13 DIAGNOSIS — F411 Generalized anxiety disorder: Secondary | ICD-10-CM | POA: Diagnosis not present

## 2023-06-20 DIAGNOSIS — F411 Generalized anxiety disorder: Secondary | ICD-10-CM | POA: Diagnosis not present

## 2023-06-28 DIAGNOSIS — F411 Generalized anxiety disorder: Secondary | ICD-10-CM | POA: Diagnosis not present

## 2023-07-05 DIAGNOSIS — F411 Generalized anxiety disorder: Secondary | ICD-10-CM | POA: Diagnosis not present

## 2023-07-06 ENCOUNTER — Other Ambulatory Visit: Payer: Self-pay | Admitting: Internal Medicine

## 2023-07-06 NOTE — Telephone Encounter (Signed)
Requested Prescriptions  Pending Prescriptions Disp Refills   metFORMIN (GLUCOPHAGE-XR) 500 MG 24 hr tablet [Pharmacy Med Name: METFORMIN ER 500MG  24HR TABS] 120 tablet 1    Sig: TAKE 2 TABLETS(1000 MG) BY MOUTH TWICE DAILY WITH A MEAL     Endocrinology:  Diabetes - Biguanides Failed - 07/06/2023  1:24 PM      Failed - B12 Level in normal range and within 720 days    Vitamin B-12  Date Value Ref Range Status  02/10/2021 1,542 (H) 200 - 1,100 pg/mL Final         Failed - CBC within normal limits and completed in the last 12 months    WBC  Date Value Ref Range Status  06/08/2023 7.3 3.8 - 10.8 Thousand/uL Final   RBC  Date Value Ref Range Status  06/08/2023 4.93 3.80 - 5.10 Million/uL Final   Hemoglobin  Date Value Ref Range Status  06/08/2023 14.8 11.7 - 15.5 g/dL Final   HCT  Date Value Ref Range Status  06/08/2023 45.2 (H) 35.0 - 45.0 % Final   MCHC  Date Value Ref Range Status  06/08/2023 32.7 32.0 - 36.0 g/dL Final    Comment:    For adults, a slight decrease in the calculated MCHC value (in the range of 30 to 32 g/dL) is most likely not clinically significant; however, it should be interpreted with caution in correlation with other red cell parameters and the patient's clinical condition.    [facility]  Date Value Ref Range Status  06/08/2023 30.0 27.0 - 33.0 pg Final   MCV  Date Value Ref Range Status  06/08/2023 91.7 80.0 - 100.0 fL Final   No results found for: "PLTCOUNTKUC", "LABPLAT", "POCPLA" RDW  Date Value Ref Range Status  06/08/2023 12.0 11.0 - 15.0 % Final         Passed - Cr in normal range and within 360 days    Creat  Date Value Ref Range Status  06/08/2023 0.60 0.50 - 1.05 mg/dL Final   Creatinine,U  Date Value Ref Range Status  02/12/2019 75.8 mg/dL Final   Creatinine, Urine  Date Value Ref Range Status  06/08/2023 199 20 - 275 mg/dL Final         Passed - HBA1C is between 0 and 7.9 and within 180 days    Hemoglobin A1C  Date Value  Ref Range Status  06/08/2023 5.6 4.0 - 5.6 % Final  03/06/2023 7.2  Final   HbA1c, POC (controlled diabetic range)  Date Value Ref Range Status  03/08/2023 7.4 (A) 0.0 - 7.0 % Final         Passed - eGFR in normal range and within 360 days    GFR calc Af Amer  Date Value Ref Range Status  09/17/2017 >60 >60 mL/min Final    Comment:    (NOTE) The eGFR has been calculated using the CKD EPI equation. This calculation has not been validated in all clinical situations. eGFR's persistently <60 mL/min signify possible Chronic Kidney Disease.    GFR, Estimated  Date Value Ref Range Status  04/05/2021 >60 >60 mL/min Final    Comment:    (NOTE) Calculated using the CKD-EPI Creatinine Equation (2021)    GFR  Date Value Ref Range Status  07/15/2020 71.39 >60.00 mL/min Final    Comment:    Calculated using the CKD-EPI Creatinine Equation (2021)   eGFR  Date Value Ref Range Status  06/08/2023 102 > OR = 60 mL/min/1.48m2 Final  Passed - Valid encounter within last 6 months    Recent Outpatient Visits           4 weeks ago Type 2 diabetes mellitus with diabetic mononeuropathy, without long-term current use of insulin [facility])   [place] [facility] [place], Salvadore Oxford, NP   4 months ago Encounter for general adult medical examination with abnormal findings   [place] [facility] [place], Salvadore Oxford, NP   6 months ago Anal fissure   [place] [facility] [place], [place] W, NP   7 months ago Type 2 diabetes mellitus with hyperglycemia, without long-term current use of insulin [facility])   [place] [facility] [place], Salvadore Oxford, NP   1 year ago Tension headache   [place] [facility] [place], Salvadore Oxford, NP       Future Appointments             In 2 months Baity, Salvadore Oxford, NP [place] [facility], [facility]   In 10 months Elie Goody, MD [facility]

## 2023-07-11 DIAGNOSIS — F411 Generalized anxiety disorder: Secondary | ICD-10-CM | POA: Diagnosis not present

## 2023-07-19 NOTE — Progress Notes (Addendum)
 Pa started   Mary Schaefer (Key: [id number]) Rx #: [id number] Ozempic  (1 MG/DOSE) 4MG JONELLE pen-injectors Form OptumRx Electronic Prior Authorization Form [phone] NCPDP)    Request Reference Number: [id number]. OZEMPIC  INJ 4MG JONELLE is approved through 07/18/2024. Your patient may now fill this prescription and it will be covered.. Authorization Expiration Date: July 18, 2024.

## 2023-07-24 ENCOUNTER — Other Ambulatory Visit: Payer: Self-pay | Admitting: Internal Medicine

## 2023-07-24 DIAGNOSIS — E039 Hypothyroidism, unspecified: Secondary | ICD-10-CM

## 2023-07-24 MED ORDER — LEVOTHYROXINE SODIUM 112 MCG PO TABS
112.0000 ug | ORAL_TABLET | Freq: Every day | ORAL | 0 refills | Status: DC
Start: 2023-07-24 — End: 2023-10-23

## 2023-07-31 ENCOUNTER — Other Ambulatory Visit: Payer: Self-pay | Admitting: Internal Medicine

## 2023-07-31 DIAGNOSIS — Z780 Asymptomatic menopausal state: Secondary | ICD-10-CM

## 2023-08-01 ENCOUNTER — Other Ambulatory Visit: Payer: 59

## 2023-08-03 ENCOUNTER — Telehealth: Payer: BC Managed Care – PPO | Admitting: Emergency Medicine

## 2023-08-03 DIAGNOSIS — Z5189 Encounter for other specified aftercare: Secondary | ICD-10-CM

## 2023-08-03 NOTE — Patient Instructions (Signed)
Mary Schaefer, thank you for joining Mary Horseman, PA-C for today's virtual visit.  While this provider is not your primary care provider (PCP), if your PCP is located in our provider database this encounter information will be shared with them immediately following your visit.   A [facility] MyChart account gives you access to today's visit and all your visits, tests, and labs performed at [facility] " click here if you don't have a [facility] MyChart account or go to [email or web address]  Consent: (Patient) Mary Schaefer provided verbal consent for this virtual visit at the beginning of the encounter.  Current Medications:  Current Outpatient Medications:    acetaminophen (TYLENOL) 500 MG tablet, Take 1,000 mg by mouth as needed for mild pain., Disp: , Rfl:    Ascorbic Acid (VITAMIN C PO), Take 1 tablet by mouth daily., Disp: , Rfl:    Blood Glucose Monitoring Suppl (ONETOUCH VERIO REFLECT) w/Device KIT, 1 Device by Does not apply route 3 (three) times daily., Disp: 1 kit, Rfl: 0   Calcium Carbonate-Vitamin D3 600-400 MG-UNIT TABS, Take by mouth., Disp: , Rfl:    cyanocobalamin 2000 MCG tablet, Take 2,000 mcg by mouth 2 (two) times daily., Disp: , Rfl:    ezetimibe (ZETIA) 10 MG tablet, TAKE 1 TABLET BY MOUTH EVERY DAY, Disp: 90 tablet, Rfl: 1   gabapentin (NEURONTIN) 100 MG capsule, Take 1 capsule (100 mg total) by mouth at bedtime., Disp: 90 capsule, Rfl: 1   glucose blood (ONETOUCH VERIO) test strip, Check blood sugar 6 x daily. DX E11.9, Disp: 200 each, Rfl: 3   hydrocortisone-pramoxine (PROCTOFOAM HC) rectal foam, Place 1 applicator rectally 2 (two) times daily., Disp: 10 g, Rfl: 1   hydrOXYzine (VISTARIL) 25 MG capsule, Take 1 capsule (25 mg total) by mouth daily as needed., Disp: 90 capsule, Rfl: 1   Lancets (ONETOUCH ULTRASOFT) lancets, Use as instructed, Disp: 100 each, Rfl: 12   levothyroxine (SYNTHROID) 112 MCG tablet, Take 1 tablet (112 mcg total) by  mouth daily before breakfast., Disp: 90 tablet, Rfl: 0   lisinopril (ZESTRIL) 5 MG tablet, Take 1 tablet (5 mg total) by mouth daily., Disp: 90 tablet, Rfl: 1   metFORMIN (GLUCOPHAGE-XR) 500 MG 24 hr tablet, TAKE 2 TABLETS(1000 MG) BY MOUTH TWICE DAILY WITH A MEAL, Disp: 120 tablet, Rfl: 1   Nitroglycerin 0.4 % OINT, Apply 1 inch (375 mg) ointment intra-anally every 12 hours for anal fissure, Disp: 30 g, Rfl: 0   ondansetron (ZOFRAN-ODT) 4 MG disintegrating tablet, DISSOLVE 1 TABLET(4 MG) ON THE TONGUE EVERY 8 HOURS AS NEEDED FOR NAUSEA OR VOMITING, Disp: 30 tablet, Rfl: 0   prednisoLONE acetate (PRED FORTE) 1 % ophthalmic suspension, INSTILL 1 DROP INTO LEFT EYE 4 TIMES A DAY, Disp: , Rfl:    rosuvastatin (CRESTOR) 10 MG tablet, TAKE 1 TABLET BY MOUTH EVERY DAY, Disp: 90 tablet, Rfl: 0   Medications ordered in this encounter:  No orders of the defined types were placed in this encounter.    *If you need refills on other medications prior to your next appointment, please contact your pharmacy*  Follow-Up: Call back or seek an in-person evaluation if the symptoms worsen or if the condition fails to improve as anticipated.  [facility] [phone]  Other Instructions    If you have been instructed to have an in-person evaluation today at a local Urgent Care facility, please use the link below. It will take you to a list of  all of our available [facility], including address, phone number and hours of operation. Please do not delay care.  [facility]  If you or a family member do not have a primary care provider, use the link below to schedule a visit and establish care. When you choose a [facility] primary care physician or advanced practice provider, you gain a long-term partner in health. Find a Primary Care Provider  Learn more about [place]'s in-office and virtual care options: [facility] - Get Care Now

## 2023-08-03 NOTE — Progress Notes (Signed)
Virtual Visit Consent   Mary Schaefer, you are scheduled for a virtual visit with a [facility] provider today. Just as with appointments in the office, your consent must be obtained to participate. Your consent will be active for this visit and any virtual visit you may have with one of our providers in the next 365 days. If you have a MyChart account, a copy of this consent can be sent to you electronically.  As this is a virtual visit, video technology does not allow for your provider to perform a traditional examination. This may limit your provider's ability to fully assess your condition. If your provider identifies any concerns that need to be evaluated in person or the need to arrange testing (such as labs, EKG, etc.), we will make arrangements to do so. Although advances in technology are sophisticated, we cannot ensure that it will always work on either your end or our end. If the connection with a video visit is poor, the visit may have to be switched to a telephone visit. With either a video or telephone visit, we are not always able to ensure that we have a secure connection.  By engaging in this virtual visit, you consent to the provision of healthcare and authorize for your insurance to be billed (if applicable) for the services provided during this visit. Depending on your insurance coverage, you may receive a charge related to this service.  I need to obtain your verbal consent now. Are you willing to proceed with your visit today? [name] has provided verbal consent on 08/03/2023 for a virtual visit (video or telephone). [name], PA-C  Date: 08/03/2023 10:59 AM   Virtual Visit via Video Note   I, [name], connected with  Mary Schaefer  ([id number], May 13, 1961) on 08/03/23 at 11:00 AM EST by a video-enabled telemedicine application and verified that I am speaking with the correct person using two identifiers.  Location: Patient: Virtual Visit Location  Patient: Home Provider: Virtual Visit Location Provider: Home Office   I discussed the limitations of evaluation and management by telemedicine and the availability of in person appointments. The patient expressed understanding and agreed to proceed.    History of Present Illness: Mary Schaefer is a 63 y.o. who identifies as a female who was assigned female at birth, and is being seen today for heel sore.  She states that she has type 2 diabetes.  States that the sore has been there about 2 days.  States that her blood sugars are normally 100s-180s.  She denies fever.  She states that the wound does appear swollen and painful. States that the injury occurred from rubbing with a shoe.  [name]  HPI: HPI  Problems:  Patient Active Problem List   Diagnosis Date Noted   Aortic atherosclerosis (HCC) 04/06/2021   GERD (gastroesophageal reflux disease) 07/19/2020   DM (diabetes mellitus), type 2 (HCC) 02/12/2019   Hyperlipidemia associated with type 2 diabetes mellitus (HCC) 09/06/2017   Essential hypertension 03/31/2016   Acquired hypothyroidism 03/31/2016   Generalized anxiety disorder 03/31/2016    Allergies:  Allergies  Allergen Reactions   Aspirin Shortness Of Breath and Hives   Penicillins Hives   Medications:  Current Outpatient Medications:    acetaminophen (TYLENOL) 500 MG tablet, Take 1,000 mg by mouth as needed for mild pain., Disp: , Rfl:    Ascorbic Acid (VITAMIN C PO), Take 1 tablet by mouth daily., Disp: , Rfl:    Blood Glucose Monitoring  Suppl (ONETOUCH VERIO REFLECT) w/Device KIT, 1 Device by Does not apply route 3 (three) times daily., Disp: 1 kit, Rfl: 0   Calcium Carbonate-Vitamin D3 600-400 MG-UNIT TABS, Take by mouth., Disp: , Rfl:    cyanocobalamin 2000 MCG tablet, Take 2,000 mcg by mouth 2 (two) times daily., Disp: , Rfl:    ezetimibe (ZETIA) 10 MG tablet, TAKE 1 TABLET BY MOUTH EVERY DAY, Disp: 90 tablet, Rfl: 1   gabapentin (NEURONTIN) 100 MG capsule, Take 1 capsule  (100 mg total) by mouth at bedtime., Disp: 90 capsule, Rfl: 1   glucose blood (ONETOUCH VERIO) test strip, Check blood sugar 6 x daily. DX E11.9, Disp: 200 each, Rfl: 3   hydrocortisone-pramoxine (PROCTOFOAM HC) rectal foam, Place 1 applicator rectally 2 (two) times daily., Disp: 10 g, Rfl: 1   hydrOXYzine (VISTARIL) 25 MG capsule, Take 1 capsule (25 mg total) by mouth daily as needed., Disp: 90 capsule, Rfl: 1   Lancets (ONETOUCH ULTRASOFT) lancets, Use as instructed, Disp: 100 each, Rfl: 12   levothyroxine (SYNTHROID) 112 MCG tablet, Take 1 tablet (112 mcg total) by mouth daily before breakfast., Disp: 90 tablet, Rfl: 0   lisinopril (ZESTRIL) 5 MG tablet, Take 1 tablet (5 mg total) by mouth daily., Disp: 90 tablet, Rfl: 1   metFORMIN (GLUCOPHAGE-XR) 500 MG 24 hr tablet, TAKE 2 TABLETS(1000 MG) BY MOUTH TWICE DAILY WITH A MEAL, Disp: 120 tablet, Rfl: 1   Nitroglycerin 0.4 % OINT, Apply 1 inch (375 mg) ointment intra-anally every 12 hours for anal fissure, Disp: 30 g, Rfl: 0   ondansetron (ZOFRAN-ODT) 4 MG disintegrating tablet, DISSOLVE 1 TABLET(4 MG) ON THE TONGUE EVERY 8 HOURS AS NEEDED FOR NAUSEA OR VOMITING, Disp: 30 tablet, Rfl: 0   prednisoLONE acetate (PRED FORTE) 1 % ophthalmic suspension, INSTILL 1 DROP INTO LEFT EYE 4 TIMES A DAY, Disp: , Rfl:    rosuvastatin (CRESTOR) 10 MG tablet, TAKE 1 TABLET BY MOUTH EVERY DAY, Disp: 90 tablet, Rfl: 0  Observations/Objective: Patient is well-developed, well-nourished in no acute distress.  Resting comfortably at home.  Head is normocephalic, atraumatic.  No labored breathing.  Speech is clear and coherent with logical content.  Patient is alert and oriented at baseline.  Patient shows me a very shallow ulceration from wear her shoe was rubbing 2 days ago.  The wound is on the distal 1/3 of the skin overlying the achilles, but is more proximal than the achilles insertion.  The wound is very small, approximately 0.5 cm on estimation from video.  No  surrounding abscess, no streaking.    Assessment and Plan: 1. Visit for wound check (Primary)   This appears to be a simple wound from an abrasion.  I recommended OTC topical antibiotic ointment.  Her blood sugars have been well controlled.  She doesn't have a fever.  I think this will heal fine with minimal care required.  I've advised patient to take daily photos until it heals.  Follow-up instructions given.  Follow Up Instructions: I discussed the assessment and treatment plan with the patient. The patient was provided an opportunity to ask questions and all were answered. The patient agreed with the plan and demonstrated an understanding of the instructions.  A copy of instructions were sent to the patient via MyChart unless otherwise noted below.     The patient was advised to call back or seek an in-person evaluation if the symptoms worsen or if the condition fails to improve as anticipated.  [name], PA-C

## 2023-08-16 DIAGNOSIS — F411 Generalized anxiety disorder: Secondary | ICD-10-CM | POA: Diagnosis not present

## 2023-08-17 ENCOUNTER — Other Ambulatory Visit: Payer: Self-pay | Admitting: Internal Medicine

## 2023-08-17 NOTE — Telephone Encounter (Signed)
 Reordered 07/06/23 #120 1 RF  Requested Prescriptions  Refused Prescriptions Disp Refills   metFORMIN (GLUCOPHAGE-XR) 500 MG 24 hr tablet [Pharmacy Med Name: METFORMIN ER 500MG  24HR TABS] 120 tablet 1    Sig: TAKE 2 TABLETS(1000 MG) BY MOUTH TWICE DAILY WITH A MEAL     Endocrinology:  Diabetes - Biguanides Failed - 08/17/2023  2:30 PM      Failed - B12 Level in normal range and within 720 days    Vitamin B-12  Date Value Ref Range Status  02/10/2021 1,542 (H) 200 - 1,100 pg/mL Final         Failed - CBC within normal limits and completed in the last 12 months    WBC  Date Value Ref Range Status  06/08/2023 7.3 3.8 - 10.8 Thousand/uL Final   RBC  Date Value Ref Range Status  06/08/2023 4.93 3.80 - 5.10 Million/uL Final   Hemoglobin  Date Value Ref Range Status  06/08/2023 14.8 11.7 - 15.5 g/dL Final   HCT  Date Value Ref Range Status  06/08/2023 45.2 (H) 35.0 - 45.0 % Final   MCHC  Date Value Ref Range Status  06/08/2023 32.7 32.0 - 36.0 g/dL Final    Comment:    For adults, a slight decrease in the calculated MCHC value (in the range of 30 to 32 g/dL) is most likely not clinically significant; however, it should be interpreted with caution in correlation with other red cell parameters and the patient's clinical condition.    [facility]  Date Value Ref Range Status  06/08/2023 30.0 27.0 - 33.0 pg Final   MCV  Date Value Ref Range Status  06/08/2023 91.7 80.0 - 100.0 fL Final   No results found for: "PLTCOUNTKUC", "LABPLAT", "POCPLA" RDW  Date Value Ref Range Status  06/08/2023 12.0 11.0 - 15.0 % Final         Passed - Cr in normal range and within 360 days    Creat  Date Value Ref Range Status  06/08/2023 0.60 0.50 - 1.05 mg/dL Final   Creatinine,U  Date Value Ref Range Status  02/12/2019 75.8 mg/dL Final   Creatinine, Urine  Date Value Ref Range Status  06/08/2023 199 20 - 275 mg/dL Final         Passed - HBA1C is between 0 and 7.9 and within 180 days     Hemoglobin A1C  Date Value Ref Range Status  06/08/2023 5.6 4.0 - 5.6 % Final  03/06/2023 7.2  Final   HbA1c, POC (controlled diabetic range)  Date Value Ref Range Status  03/08/2023 7.4 (A) 0.0 - 7.0 % Final         Passed - eGFR in normal range and within 360 days    GFR calc Af Amer  Date Value Ref Range Status  09/17/2017 >60 >60 mL/min Final    Comment:    (NOTE) The eGFR has been calculated using the CKD EPI equation. This calculation has not been validated in all clinical situations. eGFR's persistently <60 mL/min signify possible Chronic Kidney Disease.    GFR, Estimated  Date Value Ref Range Status  04/05/2021 >60 >60 mL/min Final    Comment:    (NOTE) Calculated using the CKD-EPI Creatinine Equation (2021)    GFR  Date Value Ref Range Status  07/15/2020 71.39 >60.00 mL/min Final    Comment:    Calculated using the CKD-EPI Creatinine Equation (2021)   eGFR  Date Value Ref Range Status  06/08/2023 102 >  OR = 60 mL/min/1.37m2 Final         Passed - Valid encounter within last 6 months    Recent Outpatient Visits           2 months ago Type 2 diabetes mellitus with diabetic mononeuropathy, without long-term current use of insulin [facility])   [place] [facility] [place], Salvadore Oxford, NP   5 months ago Encounter for general adult medical examination with abnormal findings   [place] [facility] [place], Salvadore Oxford, NP   7 months ago Anal fissure   [place] [facility] [place], [place], NP   8 months ago Type 2 diabetes mellitus with hyperglycemia, without long-term current use of insulin [facility])   [place] [facility] [place], Salvadore Oxford, NP   1 year ago Tension headache   [place] [facility] [place], Salvadore Oxford, NP       Future Appointments             In 3 weeks [place], Salvadore Oxford, NP [place] [facility], [facility]   In 8 months Elie Goody, MD [facility]

## 2023-08-20 ENCOUNTER — Other Ambulatory Visit: Payer: Self-pay | Admitting: Internal Medicine

## 2023-08-21 ENCOUNTER — Encounter: Payer: Self-pay | Admitting: Internal Medicine

## 2023-08-21 MED ORDER — OZEMPIC (1 MG/DOSE) 4 MG/3ML ~~LOC~~ SOPN: 1.0000 mg | PEN_INJECTOR | SUBCUTANEOUS | 0 refills | Status: DC

## 2023-08-21 NOTE — Addendum Note (Signed)
 Addended by: Lorre Munroe on: 08/21/2023 01:02 PM   Modules accepted: Orders

## 2023-08-21 NOTE — Telephone Encounter (Signed)
 Medication no longer on current medication list Requested Prescriptions  Pending Prescriptions Disp Refills   OZEMPIC, 1 MG/DOSE, 4 MG/3ML SOPN [Pharmacy Med Name: OZEMPIC 1MG  PER DOSE (4MG /3ML) PFP] 9 mL 0    Sig: INJECT 1 MG UNDER THE SKIN ONCE WEEKLY     Endocrinology:  Diabetes - GLP-1 Receptor Agonists - semaglutide Passed - 08/21/2023 11:08 AM      Passed - HBA1C in normal range and within 180 days    Hemoglobin A1C  Date Value Ref Range Status  06/08/2023 5.6 4.0 - 5.6 % Final  03/06/2023 7.2  Final   HbA1c, POC (controlled diabetic range)  Date Value Ref Range Status  03/08/2023 7.4 (A) 0.0 - 7.0 % Final         Passed - Cr in normal range and within 360 days    Creat  Date Value Ref Range Status  06/08/2023 0.60 0.50 - 1.05 mg/dL Final   Creatinine,U  Date Value Ref Range Status  02/12/2019 75.8 mg/dL Final   Creatinine, Urine  Date Value Ref Range Status  06/08/2023 199 20 - 275 mg/dL Final         Passed - Valid encounter within last 6 months    Recent Outpatient Visits           2 months ago Type 2 diabetes mellitus with diabetic mononeuropathy, without long-term current use of insulin (HCC)   [place] [facility] [place], Salvadore Oxford, NP   5 months ago Encounter for general adult medical examination with abnormal findings   [place] [facility] [place], Salvadore Oxford, NP   7 months ago Anal fissure   [place] [facility] [place], [place], NP   8 months ago Type 2 diabetes mellitus with hyperglycemia, without long-term current use of insulin [facility])   [place] [facility] [place], Salvadore Oxford, NP   1 year ago Tension headache   [place] [facility] [place], Salvadore Oxford, NP       Future Appointments             In 2 weeks Sampson Si, Salvadore Oxford, NP [place] [facility], [facility]   In 8 months Elie Goody, MD [facility]

## 2023-08-27 ENCOUNTER — Telehealth: Payer: Self-pay

## 2023-08-27 ENCOUNTER — Encounter: Payer: Self-pay | Admitting: Internal Medicine

## 2023-08-27 MED ORDER — DEXCOM G7 SENSOR MISC: 1 refills | Status: AC

## 2023-08-27 NOTE — Telephone Encounter (Signed)
 Mary Schaefer (Key: [id number]) Rx #: [id number] Need Help? Call us at [phone] Status New (Not sent to plan) Drug Dexcom G7 Sensor ePA cloud logo Form OptumRx Electronic Prior Authorization Form [id number] NCPDP) Original Claim Info 51

## 2023-08-27 NOTE — Telephone Encounter (Signed)
 Approved today by [facility] 2017 NCPDP Request Reference Number: [id number]. DEXCOM G7 MIS SENSOR is approved through 08/26/2024. Your patient may now fill this prescription and it will be covered. Effective Date: 08/27/2023 Authorization Expiration Date: 08/26/2024

## 2023-08-28 ENCOUNTER — Other Ambulatory Visit: Payer: Self-pay | Admitting: Internal Medicine

## 2023-08-28 DIAGNOSIS — I10 Essential (primary) hypertension: Secondary | ICD-10-CM

## 2023-08-29 NOTE — Telephone Encounter (Signed)
 Requested Prescriptions  Pending Prescriptions Disp Refills   gabapentin (NEURONTIN) 100 MG capsule [Pharmacy Med Name: GABAPENTIN 100MG  CAPSULES] 90 capsule 1    Sig: TAKE 1 CAPSULE(100 MG) BY MOUTH AT BEDTIME     Neurology: Anticonvulsants - gabapentin Passed - 08/29/2023 11:30 AM      Passed - Cr in normal range and within 360 days    Creat  Date Value Ref Range Status  06/08/2023 0.60 0.50 - 1.05 mg/dL Final   Creatinine,U  Date Value Ref Range Status  02/12/2019 75.8 mg/dL Final   Creatinine, Urine  Date Value Ref Range Status  06/08/2023 199 20 - 275 mg/dL Final         Passed - Completed PHQ-2 or PHQ-9 in the last 360 days      Passed - Valid encounter within last 12 months    Recent Outpatient Visits           2 months ago Type 2 diabetes mellitus with diabetic mononeuropathy, without long-term current use of insulin (HCC)   [place] [facility] [place], [place] W, NP   5 months ago Encounter for general adult medical examination with abnormal findings   [place] [facility] [place], Salvadore Oxford, NP   7 months ago Anal fissure   [place] [place], [place], NP   9 months ago Type 2 diabetes mellitus with hyperglycemia, without long-term current use of insulin [facility])   [place] [facility] [place], [place], NP   1 year ago Tension headache    [facility] [place], Salvadore Oxford, NP       Future Appointments             In 1 week [place], Salvadore Oxford, NP  [facility], [facility]   In 8 months Elie Goody, MD [facility]             lisinopril-hydrochlorothiazide (ZESTORETIC) 10-12.5 MG tablet [Pharmacy Med Name: LISINOPRIL-HCTZ 10/12.5MG  TABLETS] 90 tablet 0    Sig: TAKE 1 TABLET BY MOUTH DAILY     Cardiovascular:  ACEI + Diuretic Combos Passed - 08/29/2023 11:30 AM      Passed - Na in normal range and within 180 days     Sodium  Date Value Ref Range Status  06/08/2023 140 135 - 146 mmol/L Final  03/06/2023 136 (A) 137 - 147 Final         Passed - K in normal range and within 180 days    Potassium  Date Value Ref Range Status  06/08/2023 4.3 3.5 - 5.3 mmol/L Final         Passed - Cr in normal range and within 180 days    Creat  Date Value Ref Range Status  06/08/2023 0.60 0.50 - 1.05 mg/dL Final   Creatinine,U  Date Value Ref Range Status  02/12/2019 75.8 mg/dL Final   Creatinine, Urine  Date Value Ref Range Status  06/08/2023 199 20 - 275 mg/dL Final         Passed - eGFR is 30 or above and within 180 days    GFR calc Af Amer  Date Value Ref Range Status  09/17/2017 >60 >60 mL/min Final    Comment:    (NOTE) The eGFR has been calculated using the CKD EPI equation. This calculation has not been validated in all clinical situations. eGFR's persistently <60 mL/min signify possible  Chronic Kidney Disease.    GFR, Estimated  Date Value Ref Range Status  04/05/2021 >60 >60 mL/min Final    Comment:    (NOTE) Calculated using the CKD-EPI Creatinine Equation (2021)    GFR  Date Value Ref Range Status  07/15/2020 71.39 >60.00 mL/min Final    Comment:    Calculated using the CKD-EPI Creatinine Equation (2021)   eGFR  Date Value Ref Range Status  06/08/2023 102 > OR = 60 mL/min/1.28m2 Final         Passed - Patient is not pregnant      Passed - Last BP in normal range    BP Readings from Last 1 Encounters:  06/08/23 102/70         Passed - Valid encounter within last 6 months    Recent Outpatient Visits           2 months ago Type 2 diabetes mellitus with diabetic mononeuropathy, without long-term current use of insulin [facility])   [place] [facility] [place], Salvadore Oxford, NP   5 months ago Encounter for general adult medical examination with abnormal findings   [place] [facility] [place], Salvadore Oxford, NP   7 months ago Anal fissure   [facility] [place], [place] W, NP   9 months ago Type 2 diabetes mellitus with hyperglycemia, without long-term current use of insulin [facility])   [facility] [place], Salvadore Oxford, NP   1 year ago Tension headache   [place] [facility] [place], Salvadore Oxford, NP       Future Appointments             In 1 week [place], Salv[place]ore Oxford, NP [place] [facility], [facility]   In 8 months Elie Goody, MD [facility]

## 2023-08-29 NOTE — Telephone Encounter (Signed)
 D/C 06/08/23. Requested Prescriptions  Signed Prescriptions Disp Refills   gabapentin (NEURONTIN) 100 MG capsule 90 capsule 1    Sig: TAKE 1 CAPSULE(100 MG) BY MOUTH AT BEDTIME     Neurology: Anticonvulsants - gabapentin Passed - 08/29/2023 11:31 AM      Passed - Cr in normal range and within 360 days    Creat  Date Value Ref Range Status  06/08/2023 0.60 0.50 - 1.05 mg/dL Final   Creatinine,U  Date Value Ref Range Status  02/12/2019 75.8 mg/dL Final   Creatinine, Urine  Date Value Ref Range Status  06/08/2023 199 20 - 275 mg/dL Final         Passed - Completed PHQ-2 or PHQ-9 in the last 360 days      Passed - Valid encounter within last 12 months    Recent Outpatient Visits           2 months ago Type 2 diabetes mellitus with diabetic mononeuropathy, without long-term current use of insulin (HCC)   [place] [facility] [place] [place], [place] W, NP   5 months ago Encounter for general adult medical examination with abnormal findings   [place] [place], Salvadore Oxford, NP   7 months ago Anal fissure   [facility] [place], [place], NP   9 months ago Type 2 diabetes mellitus with hyperglycemia, without long-term current use of insulin [facility])   [facility] [place], [place], NP   1 year ago Tension headache   [place] [facility] [place], Salvadore Oxford, NP       Future Appointments             In 1 week [place], Salvadore Oxford, NP [place], [facility]   In 8 months Elie Goody, MD [facility]            Refused Prescriptions Disp Refills   lisinopril-hydrochlorothiazide (ZESTORETIC) 10-12.5 MG tablet [Pharmacy Med Name: LISINOPRIL-HCTZ 10/12.5MG  TABLETS] 90 tablet 0    Sig: TAKE 1 TABLET BY MOUTH DAILY     Cardiovascular:  ACEI + Diuretic Combos Passed - 08/29/2023 11:31 AM      Passed - Na in normal range and within 180 days     Sodium  Date Value Ref Range Status  06/08/2023 140 135 - 146 mmol/L Final  03/06/2023 136 (A) 137 - 147 Final         Passed - K in normal range and within 180 days    Potassium  Date Value Ref Range Status  06/08/2023 4.3 3.5 - 5.3 mmol/L Final         Passed - Cr in normal range and within 180 days    Creat  Date Value Ref Range Status  06/08/2023 0.60 0.50 - 1.05 mg/dL Final   Creatinine,U  Date Value Ref Range Status  02/12/2019 75.8 mg/dL Final   Creatinine, Urine  Date Value Ref Range Status  06/08/2023 199 20 - 275 mg/dL Final         Passed - eGFR is 30 or above and within 180 days    GFR calc Af Amer  Date Value Ref Range Status  09/17/2017 >60 >60 mL/min Final    Comment:    (NOTE) The eGFR has been calculated using the CKD EPI equation. This calculation has not been validated in all clinical situations. eGFR's persistently <60 mL/min signify  possible Chronic Kidney Disease.    GFR, Estimated  Date Value Ref Range Status  04/05/2021 >60 >60 mL/min Final    Comment:    (NOTE) Calculated using the CKD-EPI Creatinine Equation (2021)    GFR  Date Value Ref Range Status  07/15/2020 71.39 >60.00 mL/min Final    Comment:    Calculated using the CKD-EPI Creatinine Equation (2021)   eGFR  Date Value Ref Range Status  06/08/2023 102 > OR = 60 mL/min/1.85m2 Final         Passed - Patient is not pregnant      Passed - Last BP in normal range    BP Readings from Last 1 Encounters:  06/08/23 102/70         Passed - Valid encounter within last 6 months    Recent Outpatient Visits           2 months ago Type 2 diabetes mellitus with diabetic mononeuropathy, without long-term current use of insulin [facility])   [place] [facility] [place], Salvadore Oxford, NP   5 months ago Encounter for general adult medical examination with abnormal findings   [place] [facility] [place], Salvadore Oxford, NP   7 months ago Anal fissure   [facility] [place], [place], NP   9 months ago Type 2 diabetes mellitus with hyperglycemia, without long-term current use of insulin [facility])   [place] [facility] [place], Salvadore Oxford, NP   1 year ago Tension headache   [place] [facility] [place], Salvadore Oxford, NP       Future Appointments             In 1 week [place], Salvadore Oxford, NP [facility], [facility]   In 8 months Elie Goody, MD [facility]

## 2023-09-04 ENCOUNTER — Other Ambulatory Visit: Payer: Self-pay | Admitting: Internal Medicine

## 2023-09-05 NOTE — Telephone Encounter (Signed)
 Requested Prescriptions  Pending Prescriptions Disp Refills   ezetimibe (ZETIA) 10 MG tablet [Pharmacy Med Name: EZETIMIBE 10MG  TABLETS] 90 tablet 1    Sig: TAKE 1 TABLET BY MOUTH EVERY DAY     Cardiovascular:  Antilipid - Sterol Transport Inhibitors Failed - 09/05/2023 12:36 PM      Failed - Lipid Panel in normal range within the last 12 months    Cholesterol  Date Value Ref Range Status  06/08/2023 116 <200 mg/dL Final   LDL Cholesterol (Calc)  Date Value Ref Range Status  06/08/2023 55 mg/dL (calc) Final    Comment:    Reference range: <100 . Desirable range <100 mg/dL for primary prevention;   <70 mg/dL for patients with CHD or diabetic patients  with > or = 2 CHD risk factors. [name] LDL-C is now calculated using the Martin-Hopkins  calculation, which is a validated novel method providing  better accuracy than the Friedewald equation in the  estimation of LDL-C.  Horald Pollen et al. Lenox Ahr. 4010;272(53): 2061-2068  (http://education.Quest[employer].com/faq/FAQ164)    Direct LDL  Date Value Ref Range Status  07/15/2020 206.0 mg/dL Final    Comment:    Optimal:  <100 mg/dLNear or Above Optimal:  100-129 mg/dLBorderline High:  130-159 mg/dLHigh:  160-189 mg/dLVery High:  >190 mg/dL   HDL  Date Value Ref Range Status  06/08/2023 44 (L) > OR = 50 mg/dL Final   Triglycerides  Date Value Ref Range Status  06/08/2023 87 <150 mg/dL Final         Passed - AST in normal range and within 360 days    AST  Date Value Ref Range Status  06/08/2023 15 10 - 35 U/L Final         Passed - ALT in normal range and within 360 days    ALT  Date Value Ref Range Status  06/08/2023 18 6 - 29 U/L Final         Passed - Patient is not pregnant      Passed - Valid encounter within last 12 months    Recent Outpatient Visits           2 months ago Type 2 diabetes mellitus with diabetic mononeuropathy, without long-term current use of insulin (HCC)   [place] [facility]  [place], Salvadore Oxford, NP   6 months ago Encounter for general adult medical examination with abnormal findings   [place] [facility] [place], Salvadore Oxford, NP   8 months ago Anal fissure   [place] [facility] [place], [place], NP   9 months ago Type 2 diabetes mellitus with hyperglycemia, without long-term current use of insulin [facility])   [place] [facility] [place], Salvadore Oxford, NP   1 year ago Tension headache   [place] [facility] [place], Salvadore Oxford, NP       Future Appointments             In 2 days [place], Salvadore Oxford, NP [place] [facility], [facility]   In 8 months Elie Goody, MD [facility]

## 2023-09-07 ENCOUNTER — Encounter: Payer: Self-pay | Admitting: Internal Medicine

## 2023-09-07 ENCOUNTER — Ambulatory Visit: Payer: BC Managed Care – PPO | Admitting: Internal Medicine

## 2023-09-07 VITALS — BP 98/68 | Ht 66.0 in | Wt 138.0 lb

## 2023-09-07 DIAGNOSIS — E1169 Type 2 diabetes mellitus with other specified complication: Secondary | ICD-10-CM

## 2023-09-07 DIAGNOSIS — E1141 Type 2 diabetes mellitus with diabetic mononeuropathy: Secondary | ICD-10-CM

## 2023-09-07 DIAGNOSIS — I700 Atherosclerosis of aorta: Secondary | ICD-10-CM

## 2023-09-07 DIAGNOSIS — E039 Hypothyroidism, unspecified: Secondary | ICD-10-CM | POA: Diagnosis not present

## 2023-09-07 DIAGNOSIS — I10 Essential (primary) hypertension: Secondary | ICD-10-CM

## 2023-09-07 DIAGNOSIS — K219 Gastro-esophageal reflux disease without esophagitis: Secondary | ICD-10-CM | POA: Diagnosis not present

## 2023-09-07 DIAGNOSIS — E785 Hyperlipidemia, unspecified: Secondary | ICD-10-CM

## 2023-09-07 DIAGNOSIS — Z7984 Long term (current) use of oral hypoglycemic drugs: Secondary | ICD-10-CM

## 2023-09-07 DIAGNOSIS — E119 Type 2 diabetes mellitus without complications: Secondary | ICD-10-CM

## 2023-09-07 DIAGNOSIS — Z7985 Long-term (current) use of injectable non-insulin antidiabetic drugs: Secondary | ICD-10-CM

## 2023-09-07 DIAGNOSIS — F411 Generalized anxiety disorder: Secondary | ICD-10-CM

## 2023-09-07 NOTE — Assessment & Plan Note (Signed)
 C-Met and lipid profile today Encouraged her to consume a low-fat diet Continue rosuvastatin and ezetimibe She is allergic to aspirin

## 2023-09-07 NOTE — Assessment & Plan Note (Signed)
 Will check lipid profile at annual exam Encouraged to consume a low-fat diet Continue rosuvastatin and ezetimibe

## 2023-09-07 NOTE — Assessment & Plan Note (Signed)
 Will check A1c and urine microalbumin at annual exam Continue metformin and Ozempic and gabapentin Encourage routine eye exam Encouraged routine foot exam Immunizations UTD

## 2023-09-07 NOTE — Assessment & Plan Note (Signed)
 Stable on hydroxyzine as needed Support offered

## 2023-09-07 NOTE — Assessment & Plan Note (Signed)
Currently not an issue off meds Will monitor 

## 2023-09-07 NOTE — Progress Notes (Signed)
 Subjective:    Patient ID: Mary Schaefer, female    DOB: 10-06-1960, 63 y.o.   MRN: [id number]  HPI  Patient presents to clinic today for 70-month follow-up of chronic conditions.  HTN: Her BP today is 98/68.  She is taking lisinopril as prescribed.  There is no ECG on file.  HLD with aortic atherosclerosis: Her last LDL was 55, triglycerides 87, 05/2023.  She denies myalgias on rosuvastatin and ezetimibe.  She has an allergy to aspirin.  She tries to consume low-fat diet.  GERD: Rare. She is not currently taking any medications for this. There is no upper GI on file.  GAD: Intermittent, she is taking hydroxyzine as needed. She is not currently seeing a therapist.  She denies depression, SI/HI.  DM2 with neuropathy: Her last A1c was 5.6%, 05/2023.  She is taking metformin, ozempic and gabapentin as prescribed. Her sugars average is 119. She checks her feet routinely.  Her last eye exam was 11/2022.  Flu 02/2023.  Pneumovax 02/2019.  COVID Pfizer x 3.  Hypothyroidism: She denies any issues on her current dose of levothyroxine.  She does not follow with endocrinology.   Review of Systems     Past Medical History:  Diagnosis Date   Blood in stool    Childhood asthma    Diabetes mellitus without complication (HCC)    Dysplastic nevus 03/28/2022   left lateral thigh, excised 04/26/22, margins free   Dysplastic nevus 05/07/2023   Left lateral buttock. Mild atypia. Deep margin involved.   Endometriosis 1997   Hypertension    Thyroid disease     Current Outpatient Medications  Medication Sig Dispense Refill   acetaminophen (TYLENOL) 500 MG tablet Take 1,000 mg by mouth as needed for mild pain.     Ascorbic Acid (VITAMIN C PO) Take 1 tablet by mouth daily.     Blood Glucose Monitoring Suppl (ONETOUCH VERIO REFLECT) w/Device KIT 1 Device by Does not apply route 3 (three) times daily. 1 kit 0   Calcium Carbonate-Vitamin D3 600-400 MG-UNIT TABS Take by mouth.     Continuous Glucose  Sensor (DEXCOM G7 SENSOR) MISC Apply 1 device every 10 days 9 each 1   cyanocobalamin 2000 MCG tablet Take 2,000 mcg by mouth 2 (two) times daily.     ezetimibe (ZETIA) 10 MG tablet TAKE 1 TABLET BY MOUTH EVERY DAY 90 tablet 1   gabapentin (NEURONTIN) 100 MG capsule TAKE 1 CAPSULE(100 MG) BY MOUTH AT BEDTIME 90 capsule 1   glucose blood (ONETOUCH VERIO) test strip Check blood sugar 6 x daily. DX E11.9 200 each 3   hydrocortisone-pramoxine (PROCTOFOAM HC) rectal foam Place 1 applicator rectally 2 (two) times daily. 10 g 1   hydrOXYzine (VISTARIL) 25 MG capsule Take 1 capsule (25 mg total) by mouth daily as needed. 90 capsule 1   Lancets (ONETOUCH ULTRASOFT) lancets Use as instructed 100 each 12   levothyroxine (SYNTHROID) 112 MCG tablet Take 1 tablet (112 mcg total) by mouth daily before breakfast. 90 tablet 0   lisinopril (ZESTRIL) 5 MG tablet Take 1 tablet (5 mg total) by mouth daily. 90 tablet 1   metFORMIN (GLUCOPHAGE-XR) 500 MG 24 hr tablet TAKE 2 TABLETS(1000 MG) BY MOUTH TWICE DAILY WITH A MEAL 120 tablet 1   Nitroglycerin 0.4 % OINT Apply 1 inch (375 mg) ointment intra-anally every 12 hours for anal fissure 30 g 0   ondansetron (ZOFRAN-ODT) 4 MG disintegrating tablet DISSOLVE 1 TABLET(4 MG) ON THE TONGUE EVERY  8 HOURS AS NEEDED FOR NAUSEA OR VOMITING 30 tablet 0   OZEMPIC, 1 MG/DOSE, 4 MG/3ML SOPN Inject 1 mg into the skin once a week. 9 mL 0   prednisoLONE acetate (PRED FORTE) 1 % ophthalmic suspension INSTILL 1 DROP INTO LEFT EYE 4 TIMES A DAY     rosuvastatin (CRESTOR) 10 MG tablet TAKE 1 TABLET BY MOUTH EVERY DAY 90 tablet 0   No current facility-administered medications for this visit.    Allergies  Allergen Reactions   Aspirin Shortness Of Breath and Hives   Penicillins Hives    Family History  Problem Relation Age of Onset   Ovarian cancer Mother    Hyperlipidemia Mother    Alcohol abuse Father    Hyperlipidemia Father    Heart disease Father    Stroke Father     Hypertension Father    Diabetes Sister    Colon cancer Neg Hx    Esophageal cancer Neg Hx    Breast cancer Neg Hx     Social History   Socioeconomic History   Marital status: Married    Spouse name: Not on file   Number of children: Not on file   Years of education: Not on file   Highest education level: Bachelor's degree (e.g., BA, AB, BS)  Occupational History   Not on file  Tobacco Use   Smoking status: Never   Smokeless tobacco: Never  Vaping Use   Vaping status: Never Used  Substance and Sexual Activity   Alcohol use: Yes    Alcohol/week: 1.0 - 2.0 standard drink of alcohol    Types: 1 - 2 Glasses of wine per week    Comment: rare   Drug use: Not Currently   Sexual activity: Yes  Other Topics Concern   Not on file  Social History Narrative   ** Merged History Encounter **       Social Drivers of Health   Financial Resource Strain: Low Risk  (09/06/2023)   Overall Financial Resource Strain (CARDIA)    Difficulty of Paying Living Expenses: Not very hard  Food Insecurity: No Food Insecurity (09/06/2023)   Hunger Vital Sign    Worried About Running Out of Food in the Last Year: Never true    Ran Out of Food in the Last Year: Never true  Transportation Needs: No Transportation Needs (09/06/2023)   PRAPARE - Administrator, Civil Service (Medical): No    Lack of Transportation (Non-Medical): No  Physical Activity: Insufficiently Active (09/06/2023)   Exercise Vital Sign    Days of Exercise per Week: 2 days    Minutes of Exercise per Session: 30 min  Stress: Stress Concern Present (09/06/2023)   [employer] of Occupational Health - Occupational Stress Questionnaire    Feeling of Stress : To some extent  Social Connections: Socially Integrated (09/06/2023)   Social Connection and Isolation Panel [NHANES]    Frequency of Communication with Friends and Family: More than three times a week    Frequency of Social Gatherings with Friends and Family: Once a  week    Attends Religious Services: 1 to 4 times per year    Active Member of [employer] or Organizations: Yes    Attends Banker Meetings: 1 to 4 times per year    Marital Status: Married  Catering manager Violence: Not on file     Constitutional: Denies fever, malaise, fatigue, headache or abrupt weight changes.  HEENT: Denies eye pain, eye redness,  ear pain, ringing in the ears, wax buildup, runny nose, nasal congestion, bloody nose, or sore throat. Respiratory: Denies difficulty breathing, shortness of breath, cough or sputum production.   Cardiovascular: Denies chest pain, chest tightness, palpitations or swelling in the hands or feet.  Gastrointestinal:  Denies abdominal pain, bloating, constipation, diarrhea or blood in the stool.  GU: Denies urgency, frequency, pain with urination, burning sensation, blood in urine, odor or discharge. Musculoskeletal: Denies decrease in range of motion, difficulty with gait, muscle pain or joint pain and swelling.  Skin: Denies redness, rashes, lesions or ulcercations.  Neurological: Patient reports neuropathic pain.  Denies dizziness, difficulty with memory, difficulty with speech or problems with balance and coordination.  Psych: Patient has a history of anxiety.  Denies depression, SI/HI.  No other specific complaints in a complete review of systems (except as listed in HPI above).  Objective:   Physical Exam  BP 98/68 (BP Location: Left Arm, Patient Position: Sitting, Cuff Size: Normal)   Ht 5\' 6"  (1.676 m)   Wt 138 lb (62.6 kg)   LMP 09/10/2016   BMI 22.27 kg/m     Wt Readings from Last 3 Encounters:  06/08/23 133 lb 3.2 oz (60.4 kg)  03/08/23 149 lb (67.6 kg)  01/03/23 165 lb (74.8 kg)    General: Appears her stated age, in NAD. Skin: Warm, dry and intact. No ulcerations noted. HEENT: Head: normal shape and size; Eyes: sclera white, no icterus, conjunctiva pink, PERRLA and EOMs intact;   Neck:  Neck supple, trachea  midline. No masses, lumps or thyromegaly present.  Cardiovascular: Normal rate and rhythm. S1,S2 noted.  No murmur, rubs or gallops noted. No JVD or BLE edema. No carotid bruits noted. Pulmonary/Chest: Normal effort and positive vesicular breath sounds. No respiratory distress. No wheezes, rales or ronchi noted.  Abdomen: Normal bowel sounds.  Musculoskeletal:  No difficulty with gait.  Neurological: Alert and oriented. Coordination normal.  Psychiatric: Mood and affect normal. Behavior is normal. Judgment and thought content normal.     BMET    Component Value Date/Time   NA 140 06/08/2023 0843   NA 136 (A) 03/06/2023 0000   K 4.3 06/08/2023 0843   CL 99 06/08/2023 0843   CO2 30 06/08/2023 0843   GLUCOSE 95 06/08/2023 0843   BUN 15 06/08/2023 0843   BUN 16 03/06/2023 0000   CREATININE 0.60 06/08/2023 0843   CALCIUM 10.1 06/08/2023 0843   GFRNONAA >60 04/05/2021 1500   GFRAA >60 09/17/2017 0732    Lipid Panel     Component Value Date/Time   CHOL 116 06/08/2023 0843   TRIG 87 06/08/2023 0843   HDL 44 (L) 06/08/2023 0843   CHOLHDL 2.6 06/08/2023 0843   VLDL 43.2 (H) 09/02/2019 0809   LDLCALC 55 06/08/2023 0843    CBC    Component Value Date/Time   WBC 7.3 06/08/2023 0843   RBC 4.93 06/08/2023 0843   HGB 14.8 06/08/2023 0843   HCT 45.2 (H) 06/08/2023 0843   PLT 316 06/08/2023 0843   MCV 91.7 06/08/2023 0843   MCH 30.0 06/08/2023 0843   MCHC 32.7 06/08/2023 0843   RDW 12.0 06/08/2023 0843    Hgb A1C Lab Results  Component Value Date   HGBA1C 5.6 06/08/2023           Assessment & Plan:     RTC in 6 months for your annual exam Nicki Reaper, NP

## 2023-09-07 NOTE — Assessment & Plan Note (Signed)
 Will discontinue lisinopril Kidney function reviewed

## 2023-09-07 NOTE — Patient Instructions (Signed)

## 2023-09-07 NOTE — Assessment & Plan Note (Signed)
 Will check TSH and free T4 at annual exam Continue levothyroxine

## 2023-09-19 ENCOUNTER — Other Ambulatory Visit: Payer: Self-pay

## 2023-09-19 ENCOUNTER — Encounter: Payer: Self-pay | Admitting: Internal Medicine

## 2023-09-19 MED ORDER — METFORMIN HCL ER 500 MG PO TB24: 1000.0000 mg | ORAL_TABLET | Freq: Two times a day (BID) | ORAL | 1 refills | Status: DC

## 2023-10-16 ENCOUNTER — Ambulatory Visit
Admission: RE | Admit: 2023-10-16 | Discharge: 2023-10-16 | Disposition: A | Discharge status: Home / Self Care | Source: Ambulatory Visit | Attending: Emergency Medicine | Admitting: Emergency Medicine

## 2023-10-16 ENCOUNTER — Encounter: Payer: Self-pay | Admitting: Internal Medicine

## 2023-10-16 VITALS — BP 142/87 | HR 94 | Temp 97.9°F | Resp 16

## 2023-10-16 DIAGNOSIS — R350 Frequency of micturition: Secondary | ICD-10-CM | POA: Insufficient documentation

## 2023-10-16 LAB — POCT URINALYSIS DIP (MANUAL ENTRY)
Bilirubin, UA: NEGATIVE
Glucose, UA: NEGATIVE mg/dL
Nitrite, UA: NEGATIVE
Protein Ur, POC: 100 mg/dL — AB
Spec Grav, UA: 1.02
Urobilinogen, UA: 0.2 U/dL
pH, UA: 7.5

## 2023-10-16 MED ORDER — NITROFURANTOIN MONOHYD MACRO 100 MG PO CAPS: 100.0000 mg | ORAL_CAPSULE | Freq: Two times a day (BID) | ORAL | 0 refills | Status: AC

## 2023-10-16 NOTE — Discharge Instructions (Addendum)
 Your urinalysis shows Ezreal Turay blood cells and nitrates which are indicative of infection, your urine will be sent to the lab to determine exactly which bacteria is present, if any changes need to be made to your medications you will be notified  Begin use of Macrobid  twice a day for 5 days  You may use over-the-counter Azo to help minimize your symptoms until antibiotic removes bacteria, this medication will turn your urine orange  Increase your fluid intake through use of water   As always practice good hygiene, wiping front to back and avoidance of scented vaginal products to prevent further irritation  If symptoms continue to persist after use of medication or recur please follow-up with urgent care or your primary doctor as needed

## 2023-10-16 NOTE — ED Provider Notes (Signed)
 Mary Schaefer    CSN: [id number] Arrival date & time: 10/16/23  1145      History   Chief Complaint Chief Complaint  Patient presents with   Urinary Frequency    Entered by patient    HPI Mary Schaefer is a 63 y.o. female.   Patient presents  for evaluation of urinary frequency and dysuria beginning 1 day ago.  Associated foul urinary odor.  Has not attempted treatment.  Denies abdominal, flank pain, fever, vaginal symptoms, hematuria.  Past Medical History:  Diagnosis Date   Blood in stool    Childhood asthma    Diabetes mellitus without complication (HCC)    Dysplastic nevus 03/28/2022   left lateral thigh, excised 04/26/22, margins free   Dysplastic nevus 05/07/2023   Left lateral buttock. Mild atypia. Deep margin involved.   Endometriosis 1997   Hypertension    Thyroid  disease     Patient Active Problem List   Diagnosis Date Noted   Aortic atherosclerosis (HCC) 04/06/2021   GERD (gastroesophageal reflux disease) 07/19/2020   DM (diabetes mellitus), type 2 (HCC) 02/12/2019   Hyperlipidemia associated with type 2 diabetes mellitus (HCC) 09/06/2017   Essential hypertension 03/31/2016   Acquired hypothyroidism 03/31/2016   Generalized anxiety disorder 03/31/2016    Past Surgical History:  Procedure Laterality Date   COLONOSCOPY WITH PROPOFOL  N/A 07/10/2022   Procedure: COLONOSCOPY WITH PROPOFOL ;  Surgeon: Luke Salaam, MD;  Location: [facility] END[facility]COPY;  Service: Gastroenterology;  Laterality: N/A;   CORNEAL TRANSPLANT     TUBAL LIGATION  1998    OB History     Gravida  4   Para  3   Term  3   Preterm  0   AB  1   Living  3      SAB  1   IAB  0   Ectopic  0   Multiple      Live Births  3            Home Medications    Prior to Admission medications   Medication Sig Start Date End Date Taking? Authorizing Provider  nitrofurantoin , macrocrystal-monohydrate, (MACROBID ) 100 MG capsule Take 1 capsule (100 mg total) by mouth 2  (two) times daily. 10/16/23  Yes Quinta Eimer, Maybelle Spatz, NP  acetaminophen (TYLENOL) 500 MG tablet Take 1,000 mg by mouth as needed for mild pain.    [provider]  Ascorbic Acid (VITAMIN C PO) Take 1 tablet by mouth daily.    [provider]  Blood Glucose Monitoring Suppl (ONETOUCH VERIO REFLECT) w/Device KIT 1 Device by Does not apply route 3 (three) times daily. 01/03/23   Carollynn Cirri, NP  Calcium  Carbonate-Vitamin D3 600-400 MG-UNIT TABS Take by mouth.    [provider]  Continuous Glucose Sensor (DEXCOM G7 SENSOR) MISC Apply 1 device every 10 days 08/27/23   Carollynn Cirri, NP  cyanocobalamin  2000 MCG tablet Take 2,000 mcg by mouth 2 (two) times daily.    [provider]  ezetimibe  (ZETIA ) 10 MG tablet TAKE 1 TABLET BY MOUTH EVERY DAY 09/05/23   Carollynn Cirri, NP  gabapentin  (NEURONTIN ) 100 MG capsule TAKE 1 CAPSULE(100 MG) BY MOUTH AT BEDTIME 08/29/23   Baity, Rankin Buzzard, NP  glucose blood (ONETOUCH VERIO) test strip Check blood sugar 6 x daily. DX E11.9 01/03/23   Carollynn Cirri, NP  hydrocortisone-pramoxine (PROCTOFOAM  HC) rectal foam Place 1 applicator rectally 2 (two) times daily. 01/03/23   Carollynn Cirri,  NP  hydrOXYzine  (VISTARIL ) 25 MG capsule Take 1 capsule (25 mg total) by mouth daily as needed. 06/08/23   Carollynn Cirri, NP  Lancets [facility] ULTRASOFT) lancets Use as instructed 01/03/23   Carollynn Cirri, NP  levothyroxine  (SYNTHROID ) 112 MCG tablet Take 1 tablet (112 mcg total) by mouth daily before breakfast. 07/24/23   Carollynn Cirri, NP  metFORMIN  (GLUCOPHAGE -XR) 500 MG 24 hr tablet Take 2 tablets (1,000 mg total) by mouth 2 (two) times daily with a meal. 09/19/23   Carollynn Cirri, NP  Nitroglycerin  0.4 % OINT Apply 1 inch (375 mg) ointment intra-anally every 12 hours for anal fissure 01/03/23   Carollynn Cirri, NP  ondansetron  (ZOFRAN -ODT) 4 MG disintegrating tablet DISSOLVE 1 TABLET(4 MG) ON THE TONGUE EVERY 8 HOURS AS NEEDED FOR NAUSEA OR  VOMITING 05/08/23   Carollynn Cirri, NP  OZEMPIC , 1 MG/DOSE, 4 MG/3ML SOPN Inject 1 mg into the skin once a week. 08/21/23   Carollynn Cirri, NP  prednisoLONE acetate (PRED FORTE) 1 % ophthalmic suspension INSTILL 1 DROP INTO LEFT EYE 4 TIMES A DAY 10/02/18   [provider]  rosuvastatin  (CRESTOR ) 10 MG tablet TAKE 1 TABLET BY MOUTH EVERY DAY 02/28/23   Carollynn Cirri, NP    Family History Family History  Problem Relation Age of Onset   Ovarian cancer Mother    Hyperlipidemia Mother    Alcohol abuse Father    Hyperlipidemia Father    Heart disease Father    Stroke Father    Hypertension Father    Diabetes Sister    Colon cancer Neg Hx    Esophageal cancer Neg Hx    Breast cancer Neg Hx     Social History Social History   Tobacco Use   Smoking status: Never   Smokeless tobacco: Never  Vaping Use   Vaping status: Never Used  Substance Use Topics   Alcohol use: Yes    Alcohol/week: 1.0 - 2.0 standard drink of alcohol    Types: 1 - 2 Glasses of wine per week    Comment: rare   Drug use: Not Currently     Allergies   Aspirin and Penicillins   Review of Systems Review of Systems   Physical Exam Triage Vital Signs ED Triage Vitals  Encounter Vitals Group     BP 10/16/23 1213 (!) 142/87     Systolic BP Percentile --      Diastolic BP Percentile --      Pulse Rate 10/16/23 1213 94     Resp 10/16/23 1213 16     Temp 10/16/23 1213 97.9 F (36.6 C)     Temp Source 10/16/23 1213 Temporal     SpO2 10/16/23 1213 97 %     Weight --      Height --      Head Circumference --      Peak Flow --      Pain Score 10/16/23 1214 0     Pain Loc --      Pain Education --      Exclude from Growth Chart --    No data found.  Updated Vital Signs BP (!) 142/87 (BP Location: Left Arm)   Pulse 94   Temp 97.9 F (36.6 C) (Temporal)   Resp 16   LMP 09/10/2016   SpO2 97%   Visual Acuity Right Eye Distance:   Left Eye Distance:   Bilateral Distance:    Right  Eye  Near:   Left Eye Near:    Bilateral Near:     Physical Exam Constitutional:      Appearance: Normal appearance.  Eyes:     Extraocular Movements: Extraocular movements intact.  Abdominal:     Tenderness: There is no abdominal tenderness. There is no right CVA tenderness, left CVA tenderness or guarding.  Neurological:     Mental Status: She is alert and oriented to person, place, and time.      UC Treatments / Results  Labs (all labs ordered are listed, but only abnormal results are displayed) Labs Reviewed  URINE CULTURE  POCT URINALYSIS DIP (MANUAL ENTRY)    EKG   Radiology No results found.  Procedures Procedures (including critical care time)  Medications Ordered in UC Medications - No data to display  Initial Impression / Assessment and Plan / UC Course  I have reviewed the triage vital signs and the nursing notes.  Pertinent labs & imaging results that were available during my care of the patient were reviewed by me and considered in my medical decision making (see chart for details). Urinary  frequency  Urinalysis shows leukocytes, negative for nitrates, sent for culture, discussed findings, patient symptomatic we will initiate Macrobid  recommended supportive care and advised follow-up if symptoms persist or worsen Final Clinical Impressions(s) / UC Diagnoses   Final diagnoses:  Urinary frequency     Discharge Instructions      Your urinalysis shows Niklas Chretien blood cells and nitrates which are indicative of infection, your urine will be sent to the lab to determine exactly which bacteria is present, if any changes need to be made to your medications you will be notified  Begin use of Macrobid  twice a day for 5 days  You may use over-the-counter Azo to help minimize your symptoms until antibiotic removes bacteria, this medication will turn your urine orange  Increase your fluid intake through use of water   As always practice good hygiene, wiping  front to back and avoidance of scented vaginal products to prevent further irritation  If symptoms continue to persist after use of medication or recur please follow-up with urgent care or your primary doctor as needed    ED Prescriptions     Medication Sig Dispense Auth. Provider   nitrofurantoin , macrocrystal-monohydrate, (MACROBID ) 100 MG capsule Take 1 capsule (100 mg total) by mouth 2 (two) times daily. 10 capsule Affie Gasner R, NP      PDMP not reviewed this encounter.   Reena Canning, NP 10/16/23 1234

## 2023-10-16 NOTE — ED Triage Notes (Signed)
 Patient presents to UC for urinary freq and dysuria x 1 day. Has not taking any OTC meds for symptom relief.

## 2023-10-17 ENCOUNTER — Other Ambulatory Visit: Payer: Self-pay | Admitting: Internal Medicine

## 2023-10-17 LAB — URINE CULTURE

## 2023-10-19 NOTE — Telephone Encounter (Signed)
 Requested Prescriptions  Pending Prescriptions Disp Refills   OZEMPIC , 1 MG/DOSE, 4 MG/3ML SOPN [Pharmacy Med Name: OZEMPIC  1MG  PER DOSE (4MG /3ML) PFP] 9 mL 0    Sig: INJECT 1 MG INTO THE SKIN ONCE A WEEK     Endocrinology:  Diabetes - GLP-1 Receptor Agonists - semaglutide  Passed - 10/19/2023  8:42 AM      Passed - HBA1C in normal range and within 180 days    Hemoglobin A1C  Date Value Ref Range Status  06/08/2023 5.6 4.0 - 5.6 % Final  03/06/2023 7.2  Final   HbA1c, POC (controlled diabetic range)  Date Value Ref Range Status  03/08/2023 7.4 (A) 0.0 - 7.0 % Final         Passed - Cr in normal range and within 360 days    Creat  Date Value Ref Range Status  06/08/2023 0.60 0.50 - 1.05 mg/dL Final   Creatinine,U  Date Value Ref Range Status  02/12/2019 75.8 mg/dL Final   Creatinine, Urine  Date Value Ref Range Status  06/08/2023 199 20 - 275 mg/dL Final         Passed - Valid encounter within last 6 months    Recent Outpatient Visits           1 month ago Aortic atherosclerosis [facility])   [place] [facility] [place], Rankin Buzzard, NP       Future Appointments             In 6 months Harris Liming, MD [facility] [place] [facility]

## 2023-10-21 ENCOUNTER — Other Ambulatory Visit: Payer: Self-pay | Admitting: Internal Medicine

## 2023-10-21 DIAGNOSIS — E039 Hypothyroidism, unspecified: Secondary | ICD-10-CM

## 2023-10-23 NOTE — Telephone Encounter (Signed)
 Requested Prescriptions  Pending Prescriptions Disp Refills   levothyroxine  (SYNTHROID ) 112 MCG tablet [Pharmacy Med Name: LEVOTHYROXINE  0.112MG  ( ) TABS] 90 tablet 0    Sig: TAKE 1 TABLET(112 MCG) BY MOUTH DAILY BEFORE BREAKFAST     Endocrinology:  Hypothyroid Agents Passed - 10/23/2023 11:29 AM      Passed - TSH in normal range and within 360 days    TSH  Date Value Ref Range Status  03/06/2023 2.21 0.41 - 5.90 Final  12/01/2022 4.04 0.40 - 4.50 mIU/L Final         Passed - Valid encounter within last 12 months    Recent Outpatient Visits           1 month ago Aortic atherosclerosis [facility])   [place] [facility] [place], Rankin Buzzard, NP       Future Appointments             In 6 months Harris Liming, MD [facility] [place] [facility]

## 2023-10-24 ENCOUNTER — Other Ambulatory Visit: Payer: Self-pay | Admitting: Internal Medicine

## 2023-10-24 MED ORDER — ONDANSETRON 4 MG PO TBDP: 4.0000 mg | ORAL_TABLET | Freq: Three times a day (TID) | ORAL | 0 refills | Status: AC | PRN

## 2023-11-12 ENCOUNTER — Other Ambulatory Visit: Payer: Self-pay | Admitting: Internal Medicine

## 2023-11-13 NOTE — Telephone Encounter (Signed)
 Requested Prescriptions  Pending Prescriptions Disp Refills   metFORMIN  (GLUCOPHAGE -XR) 500 MG 24 hr tablet [Pharmacy Med Name: METFORMIN  ER 500MG  24HR TABS] 120 tablet 1    Sig: TAKE 2 TABLETS(1000 MG) BY MOUTH TWICE DAILY WITH A MEAL     Endocrinology:  Diabetes - Biguanides Failed - 11/13/2023 10:06 AM      Failed - B12 Level in normal range and within 720 days    Vitamin B-12  Date Value Ref Range Status  02/10/2021 1,542 (H) 200 - 1,100 pg/mL Final         Failed - CBC within normal limits and completed in the last 12 months    WBC  Date Value Ref Range Status  06/08/2023 7.3 3.8 - 10.8 Thousand/uL Final   RBC  Date Value Ref Range Status  06/08/2023 4.93 3.80 - 5.10 Million/uL Final   Hemoglobin  Date Value Ref Range Status  06/08/2023 14.8 11.7 - 15.5 g/dL Final   HCT  Date Value Ref Range Status  06/08/2023 45.2 (H) 35.0 - 45.0 % Final   MCHC  Date Value Ref Range Status  06/08/2023 32.7 32.0 - 36.0 g/dL Final    Comment:    For adults, a slight decrease in the calculated MCHC value (in the range of 30 to 32 g/dL) is most likely not clinically significant; however, it should be interpreted with caution in correlation with other red cell parameters and the patient's clinical condition.    [facility]  Date Value Ref Range Status  06/08/2023 30.0 27.0 - 33.0 pg Final   MCV  Date Value Ref Range Status  06/08/2023 91.7 80.0 - 100.0 fL Final   No results found for: "PLTCOUNTKUC", "LABPLAT", "POCPLA" RDW  Date Value Ref Range Status  06/08/2023 12.0 11.0 - 15.0 % Final         Passed - Cr in normal range and within 360 days    Creat  Date Value Ref Range Status  06/08/2023 0.60 0.50 - 1.05 mg/dL Final   Creatinine,U  Date Value Ref Range Status  02/12/2019 75.8 mg/dL Final   Creatinine, Urine  Date Value Ref Range Status  06/08/2023 199 20 - 275 mg/dL Final         Passed - HBA1C is between 0 and 7.9 and within 180 days    Hemoglobin A1C  Date Value  Ref Range Status  06/08/2023 5.6 4.0 - 5.6 % Final  03/06/2023 7.2  Final   HbA1c, POC (controlled diabetic range)  Date Value Ref Range Status  03/08/2023 7.4 (A) 0.0 - 7.0 % Final         Passed - eGFR in normal range and within 360 days    GFR calc Af Amer  Date Value Ref Range Status  09/17/2017 >60 >60 mL/min Final    Comment:    (NOTE) The eGFR has been calculated using the CKD EPI equation. This calculation has not been validated in all clinical situations. eGFR's persistently <60 mL/min signify possible Chronic Kidney Disease.    GFR, Estimated  Date Value Ref Range Status  04/05/2021 >60 >60 mL/min Final    Comment:    (NOTE) Calculated using the CKD-EPI Creatinine Equation (2021)    GFR  Date Value Ref Range Status  07/15/2020 71.39 >60.00 mL/min Final    Comment:    Calculated using the CKD-EPI Creatinine Equation (2021)   eGFR  Date Value Ref Range Status  06/08/2023 102 > OR = 60 mL/min/1.91m2 Final  Passed - Valid encounter within last 6 months    Recent Outpatient Visits           2 months ago Aortic atherosclerosis [facility])   [place] [facility] [place], Rankin Buzzard, NP       Future Appointments             In 5 months Harris Liming, MD [facility]

## 2023-11-26 ENCOUNTER — Other Ambulatory Visit (HOSPITAL_COMMUNITY): Payer: Self-pay

## 2023-11-26 ENCOUNTER — Telehealth: Payer: Self-pay

## 2023-11-26 NOTE — Telephone Encounter (Signed)
 Pharmacy Patient Advocate Encounter   Received notification from CoverMyMeds that prior authorization for Ozempic  (0.25 or 0.5 MG/DOSE) 2MG /3ML pen-injectors is required/requested.   Insurance verification completed.   The patient is insured through [facility] MEDICAID .   Per test claim: PA required; PA submitted to above mentioned insurance via CoverMyMeds Key/confirmation #/EOC [id number] Status is pending

## 2023-12-06 ENCOUNTER — Other Ambulatory Visit (HOSPITAL_COMMUNITY): Payer: Self-pay

## 2023-12-06 NOTE — Telephone Encounter (Signed)
 Pharmacy Patient Advocate Encounter  Received notification from [employer] that Prior Authorization for Ozempic  (0.25 or 0.5 MG/DOSE) 2MG /3ML pen-injectors has been APPROVED from 11/26/2023 to 12/10/2023   PA #/Case ID/Reference #: [id number]

## 2024-01-18 ENCOUNTER — Other Ambulatory Visit: Payer: Self-pay | Admitting: Internal Medicine

## 2024-01-22 NOTE — Telephone Encounter (Signed)
 Requested Prescriptions  Pending Prescriptions Disp Refills   metFORMIN  (GLUCOPHAGE -XR) 500 MG 24 hr tablet [Pharmacy Med Name: METFORMIN  ER 500MG  24HR TABS] 360 tablet 0    Sig: TAKE 2 TABLETS(1000 MG) BY MOUTH TWICE DAILY WITH A MEAL     Endocrinology:  Diabetes - Biguanides Failed - 01/22/2024  8:32 AM      Failed - HBA1C is between 0 and 7.9 and within 180 days    Hemoglobin A1C  Date Value Ref Range Status  06/08/2023 5.6 4.0 - 5.6 % Final  03/06/2023 7.2  Final   HbA1c, POC (controlled diabetic range)  Date Value Ref Range Status  03/08/2023 7.4 (A) 0.0 - 7.0 % Final         Failed - B12 Level in normal range and within 720 days    Vitamin B-12  Date Value Ref Range Status  02/10/2021 1,542 (H) 200 - 1,100 pg/mL Final         Failed - CBC within normal limits and completed in the last 12 months    WBC  Date Value Ref Range Status  06/08/2023 7.3 3.8 - 10.8 Thousand/uL Final   RBC  Date Value Ref Range Status  06/08/2023 4.93 3.80 - 5.10 Million/uL Final   Hemoglobin  Date Value Ref Range Status  06/08/2023 14.8 11.7 - 15.5 g/dL Final   HCT  Date Value Ref Range Status  06/08/2023 45.2 (H) 35.0 - 45.0 % Final   MCHC  Date Value Ref Range Status  06/08/2023 32.7 32.0 - 36.0 g/dL Final    Comment:    For adults, a slight decrease in the calculated MCHC value (in the range of 30 to 32 g/dL) is most likely not clinically significant; however, it should be interpreted with caution in correlation with other red cell parameters and the patient's clinical condition.    [facility]  Date Value Ref Range Status  06/08/2023 30.0 27.0 - 33.0 pg Final   MCV  Date Value Ref Range Status  06/08/2023 91.7 80.0 - 100.0 fL Final   No results found for: PLTCOUNTKUC, LABPLAT, POCPLA RDW  Date Value Ref Range Status  06/08/2023 12.0 11.0 - 15.0 % Final         Passed - Cr in normal range and within 360 days    Creat  Date Value Ref Range Status  06/08/2023 0.60  0.50 - 1.05 mg/dL Final   Creatinine, Urine  Date Value Ref Range Status  06/08/2023 199 20 - 275 mg/dL Final         Passed - eGFR in normal range and within 360 days    GFR calc Af Amer  Date Value Ref Range Status  09/17/2017 >60 >60 mL/min Final    Comment:    (NOTE) The eGFR has been calculated using the CKD EPI equation. This calculation has not been validated in all clinical situations. eGFR's persistently <60 mL/min signify possible Chronic Kidney Disease.    GFR, Estimated  Date Value Ref Range Status  04/05/2021 >60 >60 mL/min Final    Comment:    (NOTE) Calculated using the CKD-EPI Creatinine Equation (2021)    GFR  Date Value Ref Range Status  07/15/2020 71.39 >60.00 mL/min Final    Comment:    Calculated using the CKD-EPI Creatinine Equation (2021)   eGFR  Date Value Ref Range Status  06/08/2023 102 > OR = 60 mL/min/1.65m2 Final         Passed - Valid encounter within last 6  months    Recent Outpatient Visits           4 months ago Aortic atherosclerosis [facility])   [place] [facility] [place], Angeline ORN, NP       Future Appointments             In 3 months Claudene Lehmann, MD [facility]

## 2024-02-18 ENCOUNTER — Other Ambulatory Visit: Payer: Self-pay | Admitting: Internal Medicine

## 2024-02-19 NOTE — Telephone Encounter (Signed)
 Requested Prescriptions  Pending Prescriptions Disp Refills   OZEMPIC , 1 MG/DOSE, 4 MG/3ML SOPN [Pharmacy Med Name: OZEMPIC  1MG  PER DOSE (4MG /3ML) PFP] 9 mL 0    Sig: INJECT 1 MG INTO THE SKIN ONCE A WEEK     Endocrinology:  Diabetes - GLP-1 Receptor Agonists - semaglutide  Failed - 02/19/2024 10:31 AM      Failed - HBA1C in normal range and within 180 days    Hemoglobin A1C  Date Value Ref Range Status  06/08/2023 5.6 4.0 - 5.6 % Final  03/06/2023 7.2  Final   HbA1c, POC (controlled diabetic range)  Date Value Ref Range Status  03/08/2023 7.4 (A) 0.0 - 7.0 % Final         Passed - Cr in normal range and within 360 days    Creat  Date Value Ref Range Status  06/08/2023 0.60 0.50 - 1.05 mg/dL Final   Creatinine, Urine  Date Value Ref Range Status  06/08/2023 199 20 - 275 mg/dL Final         Passed - Valid encounter within last 6 months    Recent Outpatient Visits           5 months ago Aortic atherosclerosis [facility])   [place] [facility] [place], Angeline ORN, NP       Future Appointments             In 2 months Mary Lehmann, MD [facility] [place] [facility]

## 2024-02-26 ENCOUNTER — Other Ambulatory Visit: Payer: Self-pay | Admitting: Internal Medicine

## 2024-02-27 NOTE — Telephone Encounter (Signed)
 Requested Prescriptions  Pending Prescriptions Disp Refills   gabapentin  (NEURONTIN ) 100 MG capsule [Pharmacy Med Name: GABAPENTIN  100MG  CAPSULES] 90 capsule 0    Sig: TAKE 1 CAPSULE(100 MG) BY MOUTH AT BEDTIME     Neurology: Anticonvulsants - gabapentin  Passed - 02/27/2024 12:31 PM      Passed - Cr in normal range and within 360 days    Creat  Date Value Ref Range Status  06/08/2023 0.60 0.50 - 1.05 mg/dL Final   Creatinine, Urine  Date Value Ref Range Status  06/08/2023 199 20 - 275 mg/dL Final         Passed - Completed PHQ-2 or PHQ-9 in the last 360 days      Passed - Valid encounter within last 12 months    Recent Outpatient Visits           5 months ago Aortic atherosclerosis [facility])   [place] [facility] [place], Angeline ORN, NP       Future Appointments             In 2 months Claudene Lehmann, MD [facility]

## 2024-03-01 ENCOUNTER — Other Ambulatory Visit: Payer: Self-pay | Admitting: Internal Medicine

## 2024-03-03 NOTE — Telephone Encounter (Signed)
 Rx 02/27/24 #90- too soon Requested Prescriptions  Pending Prescriptions Disp Refills   gabapentin  (NEURONTIN ) 100 MG capsule [Pharmacy Med Name: GABAPENTIN  100MG  CAPSULES] 90 capsule 0    Sig: TAKE 1 CAPSULE(100 MG) BY MOUTH AT BEDTIME     Neurology: Anticonvulsants - gabapentin  Passed - 03/03/2024  1:14 PM      Passed - Cr in normal range and within 360 days    Creat  Date Value Ref Range Status  06/08/2023 0.60 0.50 - 1.05 mg/dL Final   Creatinine, Urine  Date Value Ref Range Status  06/08/2023 199 20 - 275 mg/dL Final         Passed - Completed PHQ-2 or PHQ-9 in the last 360 days      Passed - Valid encounter within last 12 months    Recent Outpatient Visits           5 months ago Aortic atherosclerosis [facility])   [place] [facility] [place], Angeline ORN, NP       Future Appointments             In 2 months Claudene Lehmann, MD [facility]

## 2024-03-14 ENCOUNTER — Encounter: Payer: Self-pay | Admitting: Internal Medicine

## 2024-03-25 ENCOUNTER — Other Ambulatory Visit: Payer: BC Managed Care – PPO

## 2024-05-06 ENCOUNTER — Ambulatory Visit: Payer: BC Managed Care – PPO | Admitting: Dermatology

## 2024-05-28 ENCOUNTER — Other Ambulatory Visit: Payer: Self-pay | Admitting: Internal Medicine

## 2024-05-30 NOTE — Telephone Encounter (Signed)
 Patient is not longer under prescriber care, will refuse this request due to this.  Requested Prescriptions  Pending Prescriptions Disp Refills   hydrOXYzine  (VISTARIL ) 25 MG capsule [Pharmacy Med Name: HYDROXYZINE  PAMOATE 25MG  CAPSULES] 90 capsule 1    Sig: TAKE 1 CAPSULE(25 MG) BY MOUTH DAILY AS NEEDED     Ear, Nose, and Throat:  Antihistamines 2 Passed - 05/30/2024 10:06 PM      Passed - Cr in normal range and within 360 days    Creat  Date Value Ref Range Status  06/08/2023 0.60 0.50 - 1.05 mg/dL Final   Creatinine, Urine  Date Value Ref Range Status  06/08/2023 199 20 - 275 mg/dL Final         Passed - Valid encounter within last 12 months    Recent Outpatient Visits           8 months ago Aortic atherosclerosis    [facility] [place], Angeline ORN, [place]

## 2024-06-07 ENCOUNTER — Other Ambulatory Visit: Payer: Self-pay | Admitting: Internal Medicine

## 2024-06-09 NOTE — Telephone Encounter (Signed)
 Requested medication (s) are due for refill today: yes  Requested medication (s) are on the active medication list: yes  Last refill:  02/19/24  Future visit scheduled: no  Notes to clinic: Unable to refill per protocol due to failed labs, no updated results.      Requested Prescriptions  Pending Prescriptions Disp Refills   OZEMPIC , 1 MG/DOSE, 4 MG/3ML SOPN [Pharmacy Med Name: OZEMPIC  1MG  PER DOSE (4MG /3ML) PFP] 9 mL 0    Sig: INJECT 1 MG INTO THE SKIN ONCE A WEEK     Endocrinology:  Diabetes - GLP-1 Receptor Agonists - semaglutide  Failed - 06/09/2024  4:23 PM      Failed - HBA1C in normal range and within 180 days    Hemoglobin A1C  Date Value Ref Range Status  06/08/2023 5.6 4.0 - 5.6 % Final  03/06/2023 7.2  Final   HbA1c, POC (controlled diabetic range)  Date Value Ref Range Status  03/08/2023 7.4 (A) 0.0 - 7.0 % Final         Failed - Cr in normal range and within 360 days    Creat  Date Value Ref Range Status  06/08/2023 0.60 0.50 - 1.05 mg/dL Final   Creatinine, Urine  Date Value Ref Range Status  06/08/2023 199 20 - 275 mg/dL Final         Failed - Valid encounter within last 6 months    Recent Outpatient Visits           9 months ago Aortic atherosclerosis   [place] [facility] [place], Angeline ORN, NP

## 2024-06-19 ENCOUNTER — Other Ambulatory Visit (HOSPITAL_COMMUNITY): Payer: Self-pay

## 2024-06-19 ENCOUNTER — Telehealth: Payer: Self-pay

## 2024-06-19 NOTE — Telephone Encounter (Signed)
 Pharmacy Patient Advocate Encounter   Received notification from [facility] KEY that prior authorization for Ozempic  4 is required/requested.   Insurance verification completed.   The patient is insured through [facility].   Per test claim: PA required; PA submitted to above mentioned insurance via Latent Key/confirmation #/EOC [facility] Status is pending

## 2024-06-20 ENCOUNTER — Other Ambulatory Visit (HOSPITAL_COMMUNITY): Payer: Self-pay

## 2024-06-20 NOTE — Telephone Encounter (Signed)
 Pharmacy Patient Advocate Encounter  Received notification from [employer] that Prior Authorization for Ozempic  4 has been APPROVED from 06/19/24 to 06/19/25. Unable to obtain price due to refill too soon rejection, last fill date 06/10/24 next available fill date1/20/26   PA #/Case ID/Reference #: # [id number]
